# Patient Record
Sex: Male | Born: 1960 | Race: White | Hispanic: No | Marital: Married | Smoking: Former smoker
Health system: Southern US, Community
[De-identification: ages and names within clinical notes are randomized; demographics above are authoritative.]

## PROBLEM LIST (undated history)

## (undated) DIAGNOSIS — G629 Polyneuropathy, unspecified: Secondary | ICD-10-CM

## (undated) DIAGNOSIS — F319 Bipolar disorder, unspecified: Secondary | ICD-10-CM

## (undated) DIAGNOSIS — F329 Major depressive disorder, single episode, unspecified: Secondary | ICD-10-CM

## (undated) DIAGNOSIS — F32A Depression, unspecified: Secondary | ICD-10-CM

## (undated) DIAGNOSIS — F419 Anxiety disorder, unspecified: Secondary | ICD-10-CM

## (undated) DIAGNOSIS — G20 Parkinson's disease: Secondary | ICD-10-CM

## (undated) DIAGNOSIS — R251 Tremor, unspecified: Secondary | ICD-10-CM

## (undated) DIAGNOSIS — E7800 Pure hypercholesterolemia, unspecified: Secondary | ICD-10-CM

## (undated) DIAGNOSIS — I10 Essential (primary) hypertension: Secondary | ICD-10-CM

## (undated) DIAGNOSIS — E079 Disorder of thyroid, unspecified: Secondary | ICD-10-CM

## (undated) DIAGNOSIS — G20A1 Parkinson's disease without dyskinesia, without mention of fluctuations: Secondary | ICD-10-CM

## (undated) HISTORY — PX: HEMORRHOID SURGERY: SHX153

## (undated) HISTORY — DX: Tremor, unspecified: R25.1

## (undated) HISTORY — DX: Essential (primary) hypertension: I10

## (undated) HISTORY — PX: MULTIPLE TOOTH EXTRACTIONS: SHX2053

---
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
## 2013-06-29 ENCOUNTER — Encounter: Payer: Self-pay | Admitting: Neurology

## 2013-06-29 DIAGNOSIS — I10 Essential (primary) hypertension: Secondary | ICD-10-CM | POA: Insufficient documentation

## 2013-06-29 DIAGNOSIS — R251 Tremor, unspecified: Secondary | ICD-10-CM

## 2013-07-03 ENCOUNTER — Encounter: Payer: Self-pay | Admitting: Neurology

## 2013-07-03 ENCOUNTER — Ambulatory Visit (INDEPENDENT_AMBULATORY_CARE_PROVIDER_SITE_OTHER): Payer: BC Managed Care – PPO | Admitting: Neurology

## 2013-07-03 VITALS — BP 136/86 | HR 85 | Ht 72.0 in | Wt 171.0 lb

## 2013-07-03 DIAGNOSIS — R259 Unspecified abnormal involuntary movements: Secondary | ICD-10-CM

## 2013-07-03 DIAGNOSIS — I10 Essential (primary) hypertension: Secondary | ICD-10-CM

## 2013-07-03 DIAGNOSIS — R251 Tremor, unspecified: Secondary | ICD-10-CM

## 2013-07-10 ENCOUNTER — Ambulatory Visit: Payer: BC Managed Care – PPO

## 2013-07-10 NOTE — Progress Notes (Signed)
[facility]  PATIENT: Fidencio Duddy DOB: [birth year]  HISTORICAL  Mr. Bacha is a 52 years old right handed male, referred by his primary care physician Dr. Dewain Penning for evaluation of tremor. He is accompanied by his wife at today's clinical visit.  He has PMHx of HTN, hypothyroidism, on supplement, bipolar disorder, taking risperidone, more than ten years, lithium 300mg  tid, report level was within normal limit, he is also taking alprazolam 1mg  tid.  He is under the care of Dr. Tomasa Rand,   He had a lot of stress recently, his son died after struck by a truck in August 2013, his wife suffered stroke in August 2011.  He complains of bilateral hand tremor over the past few years, getting worse since 2013, fairly symmtric, if he holds anything more than 5 Lbs, for more than 10 minutes, he has right arm and hand shaking, he spills water when holding a glass of water.  He denies significant gait difficulty, denies loss sense of smell. He denies family history of tremor.  Lab in July 25th 2014, showed normal CBC.   REVIEW OF SYSTEMS: Full 14 system review of systems performed and notable only for chills, weight gain, fatigue, hearing loss, ringing in ears, trouble swallowing, blurred vision, shortness of breath, cough, wheezing, impotence, feeling cold, allergies, memory loss, confusion, numbness, weakness, difficulty swallowing, tremor, sleepiness, depression, anxiety, decreased energy, change in appetite, disinterested in activities, racing thoughts  ALLERGIES: No Known Allergies  HOME MEDICATIONS: Outpatient Prescriptions Prior to Visit  Medication Sig Dispense Refill  . ALPRAZolam (XANAX XR) 0.5 MG 24 hr tablet Take 1 mg by mouth 3 (three) times daily.       Marland Kitchen amLODipine (NORVASC) 5 MG tablet Take 5 mg by mouth daily.      Marland Kitchen levothyroxine (SYNTHROID, LEVOTHROID) 50 MCG tablet Take 75 mcg by mouth daily before breakfast.       . lithium carbonate 300 MG capsule  Take 300 mg by mouth 3 (three) times daily with meals.      . risperiDONE (RISPERDAL) 1 MG tablet Take 2 mg by mouth daily.         PAST MEDICAL HISTORY: Past Medical History  Diagnosis Date  . High blood pressure   . Tremor     PAST SURGICAL HISTORY: Past Surgical History  Procedure Laterality Date  . Hemorrhoid surgery      1980's    FAMILY HISTORY: Family History  Problem Relation Age of Onset  . Heart attack Mother   . Liver disease Father     SOCIAL HISTORY:  History   Social History  . Marital Status: Married    Spouse Name: Misty Stanley    Number of Children: 1  . Years of Education: 12   Occupational History  .      Not working   Social History Main Topics  . Smoking status: Current Every Day Smoker    Types: Cigarettes  . Smokeless tobacco: Never Used     Comment: One Pack daily  . Alcohol Use: 1.2 oz/week    2 Cans of beer per week     Comment: Daily  . Drug Use: No     Comment: Quit 2009  . Sexual Activity: Not on file    Social History Narrative   Patient lives at home with with his wife (stacy). Patient is not working at this time. Patient has a 12th grade education.    Caffeine - four mountain dews daily.  Right handed.    PHYSICAL EXAM  Filed Vitals:   07/03/13 0828  BP: 136/86  Pulse: 85  Height: 6' (1.829 m)  Weight: 171 lb (77.565 kg)    Body mass index is 23.19 kg/(m^2).   Generalized: In no acute distress  Neck: Supple, no carotid bruits   Cardiac: Regular rate rhythm  Pulmonary: Clear to auscultation bilaterally  Musculoskeletal: No deformity  Neurological examination  Mentation: depressed looking middle age male,  oriented to time, place, history taking, and causual conversation  Cranial nerve II-XII: Pupils were equal round reactive to light extraocular movements were full, visual field were full on confrontational test. facial sensation and strength were normal. hearing was intact to finger rubbing bilaterally.  Uvula tongue midline.  head turning and shoulder shrug and were normal and symmetric.Tongue protrusion into cheek strength was normal.  Motor: normal bulk and strength, mild bilateral hand positional tremor  Sensory: Intact to fine touch, pinprick, preserved vibratory sensation, and proprioception at toes.  Coordination: Normal finger to nose, heel-to-shin bilaterally there was no truncal ataxia  Gait: Rising up from seated position without assistance, normal stance, without trunk ataxia, moderate stride, good arm swing, smooth turning, able to perform tiptoe, and heel walking without difficulty.   Romberg signs: Negative  Deep tendon reflexes: Brachioradialis 2/2, biceps 2/2, triceps 2/2, patellar 2/2, Achilles 2/2, plantar responses were flexor bilaterally.   DIAGNOSTIC DATA (LABS, IMAGING, TESTING) - I reviewed patient records, labs, notes, testing and imaging myself where available.    ASSESSMENT AND PLAN   52 year old RH Caucasian male with history of bipolar disorder, on multiple medications, is on long term lithium use 300mg  tid, level was within therapeutics.  1. His tremor are most like due to long term lithium use 2.  Will precede with MRI brain ,to rule out cns structure lesions, with his constellation of complains including memory loss, difficulty swallowing    Levert Feinstein, M.D. Ph.D.  [facility] [address] [place], [place] [address] [phone]

## 2013-07-19 ENCOUNTER — Ambulatory Visit (INDEPENDENT_AMBULATORY_CARE_PROVIDER_SITE_OTHER): Payer: BC Managed Care – PPO

## 2013-07-19 DIAGNOSIS — I10 Essential (primary) hypertension: Secondary | ICD-10-CM

## 2013-07-19 DIAGNOSIS — R251 Tremor, unspecified: Secondary | ICD-10-CM

## 2013-07-19 DIAGNOSIS — R259 Unspecified abnormal involuntary movements: Secondary | ICD-10-CM

## 2013-07-21 NOTE — Progress Notes (Signed)
Quick Note:  MRI brain showed age related changes, no acute lesions. ______

## 2013-07-26 ENCOUNTER — Telehealth: Payer: Self-pay | Admitting: Neurology

## 2013-07-26 NOTE — Telephone Encounter (Signed)
Spoke to spouse. Gave MRI results.

## 2013-07-27 NOTE — Progress Notes (Signed)
Quick Note:  Spoke to wife and relayed MRI brain results, per Dr. Terrace Arabia. ______

## 2013-11-29 ENCOUNTER — Ambulatory Visit (INDEPENDENT_AMBULATORY_CARE_PROVIDER_SITE_OTHER): Payer: BC Managed Care – PPO | Admitting: Nurse Practitioner

## 2013-11-29 ENCOUNTER — Encounter: Payer: Self-pay | Admitting: Nurse Practitioner

## 2013-11-29 ENCOUNTER — Encounter (INDEPENDENT_AMBULATORY_CARE_PROVIDER_SITE_OTHER): Payer: Self-pay

## 2013-11-29 VITALS — BP 122/78 | HR 77 | Ht 72.0 in | Wt 159.0 lb

## 2013-11-29 DIAGNOSIS — R251 Tremor, unspecified: Secondary | ICD-10-CM

## 2013-11-29 DIAGNOSIS — R259 Unspecified abnormal involuntary movements: Secondary | ICD-10-CM

## 2013-11-29 NOTE — Progress Notes (Signed)
[facility]  PATIENT: [name] DOB: Oct 08, 1961   REASON FOR VISIT: follow up for tremor   HISTORY OF PRESENT ILLNESS: [name], 53 year old male returns for followup. He was initially evaluated by Dr. Krista Chavez 07/03/2013 for tremor. He has been on lithium for years has bipolar disorder. MRI of the brain done 07/19/2013 without acute findings. His tremor gets worse if he is anxious. He has problems holding anything more than 5 pounds which will cause his right arm and hand to shake. He has not fallen he has no balance issues. He denies loss of smell. There is no family history of tremor. He returns for reevaluation.   HISTORY: evaluation of tremor. He is accompanied by his wife at today's clinical visit.  He has PMHx of HTN, hypothyroidism, on supplement, bipolar disorder, taking risperidone, more than ten years, lithium 300mg  tid, report level was within normal limit, he is also taking alprazolam 1mg  tid. He is under the care of Dr. Candis Chavez, He had a lot of stress recently, his son died after struck by a truck in August 2013, his wife suffered stroke in August 2011.  He complains of bilateral hand tremor over the past few years, getting worse since 2013, fairly symmtric, if he holds anything more than 5 Lbs, for more than 10 minutes, he has right arm and hand shaking, he spills water when holding a glass of water.  He denies significant gait difficulty, denies loss sense of smell. He denies family history of tremor.  Lab in July 25th 2014, showed normal CBC.    REVIEW OF SYSTEMS: Full 14 system review of systems performed and notable only for those listed, all others are neg:  Constitutional: N/A  Cardiovascular: N/A  Ear/Nose/Throat: Drooling Skin: N/A  Eyes: N/A  Respiratory: N/A  Gastroitestinal: N/A  Hematology/Lymphatic: N/A  Endocrine: N/A Musculoskeletal:N/A  Allergy/Immunology: N/A  Neurological: Memory loss, tremors Psychiatric: Anxiety,   depression   ALLERGIES: No Known Allergies  HOME MEDICATIONS: Outpatient Prescriptions Prior to Visit  Medication Sig Dispense Refill  . amLODipine (NORVASC) 5 MG tablet Take 5 mg by mouth daily.      Marland Kitchen lithium carbonate 300 MG capsule Take 300 mg by mouth 3 (three) times daily with meals.      . ALPRAZolam (XANAX XR) 0.5 MG 24 hr tablet Take 1 mg by mouth 3 (three) times daily.       Marland Kitchen levothyroxine (SYNTHROID, LEVOTHROID) 50 MCG tablet Take 75 mcg by mouth daily before breakfast.       . risperiDONE (RISPERDAL) 1 MG tablet Take 2 mg by mouth daily.        No facility-administered medications prior to visit.    PAST MEDICAL HISTORY: Past Medical History  Diagnosis Date  . High blood pressure   . Tremor     PAST SURGICAL HISTORY: Past Surgical History  Procedure Laterality Date  . Hemorrhoid surgery      1980's    FAMILY HISTORY: Family History  Problem Relation Age of Onset  . Heart attack Mother   . Liver disease Father     SOCIAL HISTORY: History   Social History  . Marital Status: Married    Spouse Name: [name]    Number of Children: 1  . Years of Education: 12   Occupational History  .      Not working   Social History Main Topics  . Smoking status: Current Every Day Smoker    Types: Cigarettes  . Smokeless tobacco:  Never Used     Comment: One Pack daily  . Alcohol Use: No  . Drug Use: No     Comment: Quit 2009  . Sexual Activity: Not on file   Other Topics Concern  . Not on file   Social History Narrative   Patient lives at home with with [name]). Patient is not working at this time. Patient has a 12th grade education.    Caffeine - four mountain dews daily.   Right handed.   Patient has one child.     PHYSICAL EXAM  Filed Vitals:   11/29/13 1322  BP: 122/78  Pulse: 77  Height: 6' (1.829 m)  Weight: 159 lb (72.122 kg)   Body mass index is 21.56 kg/(m^2).  Generalized: Well developed, in no acute distress  Head:  normocephalic and atraumatic,. Oropharynx benign  Neck: Supple, no carotid bruits  Cardiac: Regular rate rhythm, no murmur  Musculoskeletal: No deformity   Neurological examination   Mentation: Alert oriented to time, place, history taking. Follows all commands speech and language fluent  Cranial nerve II-XII: Fundoscopic exam reveals sharp disc margins.Pupils were equal round reactive to light extraocular movements were full, visual field were full on confrontational test. Facial sensation and strength were normal. hearing was intact to finger rubbing bilaterally. Uvula tongue midline. head turning and shoulder shrug were normal and symmetric.Tongue protrusion into cheek strength was normal. Motor: normal bulk and tone, full strength in the BUE, BLE,  No focal weakness, mild bilateral outstretch tremor Sensory: normal and symmetric to light touch, pinprick, and  vibration  Coordination: finger-nose-finger, heel-to-shin bilaterally, no dysmetria Reflexes: Brachioradialis 2/2, biceps 2/2, triceps 2/2, patellar 2/2, Achilles 2/2, plantar responses were flexor bilaterally. Gait and Station: Rising up from seated position without assistance, normal stance,  moderate stride, good arm swing, smooth turning, able to perform tiptoe, and heel walking without difficulty. Tandem gait is steady. No assistive device  DIAGNOSTIC DATA (LABS, IMAGING, TESTING) -None to review  ASSESSMENT AND PLAN  53 y.o. year old male  has a past medical history of bipolar disorder and long-term lithium use and Tremor. here to followup. MRI of the brain performed in September with nothing acute.  We will continue to monitor tremor over time Most likely due to long term use of Lithium F/U yearly Dennie Bible, [facility], [facility], [facility] [address] [place], [place] [address] [phone]

## 2013-11-29 NOTE — Patient Instructions (Signed)
We will continue to monitor tremor over time Most likely due to long term use of Lithium MRI of the brain with nothing acute F/U yearly

## 2014-01-02 ENCOUNTER — Ambulatory Visit: Payer: BC Managed Care – PPO | Admitting: Nurse Practitioner

## 2014-03-07 DIAGNOSIS — Z0289 Encounter for other administrative examinations: Secondary | ICD-10-CM

## 2014-07-24 ENCOUNTER — Telehealth: Payer: Self-pay | Admitting: Neurology

## 2014-07-24 NOTE — Telephone Encounter (Signed)
Patient's wife calling to state that patient was seen today at [facility] where his PCP suggested to have him evaluated back at [facility] as soon as possible for  follow up for worsening symptoms.  He has an appointment scheduled in January but needs to be seen sooner. Best call back is [phone].

## 2014-07-24 NOTE — Telephone Encounter (Signed)
Called patient wife and told her Dr.Yan had a cx for 07-25-2014.

## 2014-07-25 ENCOUNTER — Encounter: Payer: Self-pay | Admitting: Neurology

## 2014-07-25 ENCOUNTER — Ambulatory Visit (INDEPENDENT_AMBULATORY_CARE_PROVIDER_SITE_OTHER): Payer: BC Managed Care – PPO | Admitting: Neurology

## 2014-07-25 VITALS — BP 123/83 | HR 86 | Ht 72.0 in | Wt 140.0 lb

## 2014-07-25 DIAGNOSIS — R251 Tremor, unspecified: Secondary | ICD-10-CM

## 2014-07-25 DIAGNOSIS — R259 Unspecified abnormal involuntary movements: Secondary | ICD-10-CM

## 2014-07-25 DIAGNOSIS — R269 Unspecified abnormalities of gait and mobility: Secondary | ICD-10-CM

## 2014-07-25 DIAGNOSIS — I10 Essential (primary) hypertension: Secondary | ICD-10-CM

## 2014-07-25 MED ORDER — CARBIDOPA-LEVODOPA 25-100 MG PO TABS: 1.0000 | ORAL_TABLET | Freq: Three times a day (TID) | ORAL | Status: DC

## 2014-07-25 NOTE — Progress Notes (Signed)
[facility]  PATIENT: [name] DOB: 1961/01/18   REASON FOR VISIT: follow up for tremor (initial visit August 2014)   HISTORY OF PRESENT ILLNESS:  He is accompanied by his wife at today's clinical visit.   He has PMHx of HTN, hypothyroidism, on supplement, bipolar disorder, taking risperidone, more than ten years, lithium 300mg  tid, report level was within normal limit, he is also taking alprazolam 1mg  tid. He is under the care of Dr. Candis Schatz, He had a lot of stress recently, his son died after struck by a truck in August 2013, his wife suffered stroke in August 2011.   He complains of bilateral hand tremor over the past few years, getting worse since 2013, fairly symmtric, if he holds anything more than 5 Lbs, for more than 10 minutes, he has right arm and hand shaking, he spills water when holding a glass of water.   He denies significant gait difficulty, denies loss sense of smell. He denies family history of tremor.  Lab in July 25th 2014, showed normal CBC.    MRI of the brain done 07/19/2013 without acute findings. His tremor gets worse if he is anxious.    UPDATE Sep 9th 2015:  He has gradually declining functional status, he is no longer working, sit at home most of the time, is no longer driving, he was evaluated by a psychiatrist Dr. Candis Schatz in June 2015, Lithium was decreased from 900mg  daily to 600 every day, he is also taking Risperdal 4 mg every day  He has worsening memory trouble, constipation, restless he also reported worsening depression, point of suicidal thought sometimes   REVIEW OF SYSTEMS: Full 14 system review of systems performed and notable only for those listed, all others are neg:  Activity change, appetite change, chills, fatigue, cold intolerance, constipation, walking difficulty, memory loss, dizziness, numbness, weakness, tremor, behavior problem, confusion, depression, anxiety.  ALLERGIES: No Known Allergies  HOME  MEDICATIONS: Outpatient Prescriptions Prior to Visit  Medication Sig Dispense Refill  . ALPRAZolam (XANAX) 1 MG tablet Take 1 mg by mouth 3 (three) times daily.      Marland Kitchen amLODipine (NORVASC) 5 MG tablet Take 5 mg by mouth daily.      Marland Kitchen levothyroxine (SYNTHROID, LEVOTHROID) 75 MCG tablet Take 75 mcg by mouth daily before breakfast.      . lithium carbonate 300 MG capsule Take 300 mg by mouth 3 (three) times daily with meals.      . risperiDONE (RISPERDAL) 2 MG tablet Take 2 mg by mouth at bedtime.       No facility-administered medications prior to visit.    PAST MEDICAL HISTORY: Past Medical History  Diagnosis Date  . High blood pressure   . Tremor     PAST SURGICAL HISTORY: Past Surgical History  Procedure Laterality Date  . Hemorrhoid surgery      1980's    FAMILY HISTORY: Family History  Problem Relation Age of Onset  . Heart attack Mother   . Liver disease Father     SOCIAL HISTORY: History   Social History  . Marital Status: Married    Spouse Name: [name]    Number of Children: 1  . Years of Education: 12   Occupational History  .      Not working   Social History Main Topics  . Smoking status: Current Every Day Smoker    Types: Cigarettes  . Smokeless tobacco: Never Used     Comment: One Pack daily  .  Alcohol Use: No  . Drug Use: No     Comment: Quit 2009  . Sexual Activity: Not on file   Other Topics Concern  . Not on file   Social History Narrative   Patient lives at home with with his wife [name]). Patient is not working at this time. Patient has a 12th grade education.    Caffeine - four mountain dews daily.   Right handed.   Patient has one child.     PHYSICAL EXAM  Filed Vitals:   07/25/14 0816  BP: 123/83  Pulse: 86  Height: 6' (1.829 m)  Weight: 140 lb (63.504 kg)   Body mass index is 18.98 kg/(m^2).  Generalized: Well developed, in no acute distress  Head: normocephalic and atraumatic,. Oropharynx benign  Neck: Supple, no  carotid bruits  Cardiac: Regular rate rhythm, no murmur  Musculoskeletal: No deformity   Neurological examination   Mentation: Alert oriented to time, place, history taking. Follows all commands speech and language fluent, Decreased facial expression, Mini-Mental Status Examination is 25 out of 30 today, he was not able to do serial 7, or spell world backwards  Cranial nerve II-XII: Fundoscopic exam reveals sharp disc margins.Pupils were equal round reactive to light extraocular movements were full, visual field were full on confrontational test. Facial sensation and strength were normal. hearing was intact to finger rubbing bilaterally. Uvula tongue midline. head turning and shoulder shrug were normal and symmetric.Tongue protrusion into cheek strength was normal. Motor: mild bilateral outstretch tremor, Mild bilateral limb and nuchal rigidity, bradykinesia on rapid wrist opening and closure,  Sensory: normal and symmetric to light touch, pinprick, and  vibration  Coordination: finger-nose-finger, heel-to-shin bilaterally, no dysmetria Reflexes: Brachioradialis 2/2, biceps 2/2, triceps 2/2, patellar 2/2, Achilles 2/2, plantar responses were flexor bilaterally. Gait and Station: Rising up from seated position without assistance, leaning forward, decreased bilateral arm swing,  DIAGNOSTIC DATA (LABS, IMAGING, TESTING) -None to review  ASSESSMENT AND PLAN  53 y.o. year old male  has a past medical history of bipolar disorder and long-term lithium and Risperdal , previous MRI of the brain showed no significant abnormalities, he continued to have more disorder, worsening depression, also memory trouble.  His constellation of complaints, most likely due to his mood disorder, long-term psychiatric medication use, including dopamine antagonist. He does has mild parkinsonian features, I will try low dose sinemet 25/100 mg 3 times a day  Home physical therapy Also encourage patient and his wife to  work closely with his psychiatrist, to better control his depression.

## 2014-10-25 ENCOUNTER — Ambulatory Visit: Payer: BC Managed Care – PPO | Admitting: Neurology

## 2014-10-26 ENCOUNTER — Ambulatory Visit (INDEPENDENT_AMBULATORY_CARE_PROVIDER_SITE_OTHER): Payer: BC Managed Care – PPO | Admitting: Neurology

## 2014-10-26 ENCOUNTER — Encounter: Payer: Self-pay | Admitting: Neurology

## 2014-10-26 VITALS — BP 114/73 | HR 99 | Ht 70.0 in | Wt 144.0 lb

## 2014-10-26 DIAGNOSIS — G20 Parkinson's disease: Secondary | ICD-10-CM

## 2014-10-26 DIAGNOSIS — R269 Unspecified abnormalities of gait and mobility: Secondary | ICD-10-CM

## 2014-10-26 DIAGNOSIS — R251 Tremor, unspecified: Secondary | ICD-10-CM

## 2014-10-26 MED ORDER — CARBIDOPA-LEVODOPA 25-100 MG PO TABS: 1.0000 | ORAL_TABLET | Freq: Three times a day (TID) | ORAL | Status: DC

## 2014-10-26 NOTE — Progress Notes (Signed)
[facility]  PATIENT: [name] DOB: 07-Feb-1971   REASON FOR VISIT: follow up for tremor (initial visit August 2014)   HISTORY OF PRESENT ILLNESS:  He is accompanied by his wife at today's clinical visit.   He has PMHx of HTN, hypothyroidism, on supplement, bipolar disorder, taking risperidone 4 mg daily, more than ten years, lithium 300mg  tid, report level was within normal limit, he is also taking alprazolam 1mg  tid. His psychiatrist is Dr. Candis Schatz, He had a lot of stress recently, his son died after struck by a truck in August 2013, his wife suffered stroke in August 2011.   He complains of bilateral hand tremor over the past few years, getting worse since 2013, fairly symmtric, if he holds anything more than 5 Lbs, for more than 10 minutes, he has right arm and hand shaking, he spills water when holding a glass of water.   He denies significant gait difficulty, denies loss sense of smell. He denies family history of tremor.  Lab in July 25th 2014, showed normal CBC.    MRI of the brain done 07/19/2013 without acute findings. His tremor gets worse if he is anxious.    UPDATE Sep 9th 2015:  He has gradually declining functional status, he is no longer working, sit at home most of the time, is no longer driving, he was recently evaluated by his psychiatrist Dr. Candis Schatz in June 2015, Lithium was decreased from 900mg  daily to 600 every day, he is also taking Risperdal 4 mg every day  He has worsening memory trouble, constipation, restless he also reported worsening depression, to the point of suicidal thought sometimes   UPDATE Oct 26 2014: Sinemet 25/100 3 times a day since Sep 2015, has been very helpful, his tremor has much improved,  he continue has mild unsteady gait, recently complains of bilateral heel pain, thick skin at left medial heel.  He is still taking Risperdal 4 mg every night, Xanax 1 mg half to one tablet as needed, lithium 300 mg 2  tablets every night, Synthroid 75 g once every day  REVIEW OF SYSTEMS: Full 14 system review of systems performed and notable only for those listed, all others are neg:  Ringing ears, running nose, trouble swallowing, drooling, double vision, shortness of breath, choking, constipation, acting out of dreams, neck stiffness, speech difficulty   ALLERGIES: Allergies  Allergen Reactions  . Codeine Other (See Comments)    Agitation/mean     HOME MEDICATIONS: Outpatient Prescriptions Prior to Visit  Medication Sig Dispense Refill  . ALPRAZolam (XANAX) 1 MG tablet Take 1 mg by mouth 3 (three) times daily.    Marland Kitchen amLODipine (NORVASC) 5 MG tablet Take 5 mg by mouth daily.    Marland Kitchen atorvastatin (LIPITOR) 20 MG tablet Take 20 mg by mouth at bedtime.    . carbidopa-levodopa (SINEMET) 25-100 MG per tablet Take 1 tablet by mouth 3 (three) times daily. 90 tablet 6  . levothyroxine (SYNTHROID, LEVOTHROID) 75 MCG tablet Take 75 mcg by mouth daily before breakfast.    . lithium carbonate 300 MG capsule Take 300 mg by mouth 2 (two) times daily with a meal.     . risperiDONE (RISPERDAL) 2 MG tablet Take 2 mg by mouth 4 (four) times daily.      No facility-administered medications prior to visit.    PAST MEDICAL HISTORY: Past Medical History  Diagnosis Date  . High blood pressure   . Tremor     PAST SURGICAL HISTORY:  Past Surgical History  Procedure Laterality Date  . Hemorrhoid surgery      1980's    FAMILY HISTORY: Family History  Problem Relation Age of Onset  . Heart attack Mother   . Liver disease Father     SOCIAL HISTORY: History   Social History  . Marital Status: Married    Spouse Name: [name]    Number of Children: 1  . Years of Education: 12   Occupational History  .      Not working   Social History Main Topics  . Smoking status: Current Every Day Smoker -- 1.00 packs/day    Types: Cigarettes  . Smokeless tobacco: Never Used     Comment: One Pack daily  . Alcohol  Use: No  . Drug Use: No     Comment: Quit 2009  . Sexual Activity: Not on file   Other Topics Concern  . Not on file   Social History Narrative   Patient lives at home with with his wife [name]). Patient is not working at this time. Patient has a 12th grade education.    Caffeine - four mountain dews daily.   Right handed.   Patient has one child.     PHYSICAL EXAM  Filed Vitals:   10/26/14 0823  BP: 114/73  Pulse: 99  Height: 5\' 10"  (1.778 m)  Weight: 144 lb (65.318 kg)   Body mass index is 20.66 kg/(m^2).  Generalized: Well developed, in no acute distress  Head: normocephalic and atraumatic,. Oropharynx benign  Neck: Supple, no carotid bruits  Cardiac: Regular rate rhythm, no murmur  Musculoskeletal: No deformity   Neurological examination   Mentation: Alert oriented to time, place, history taking. Follows all commands speech and language fluent, Decreased facial expression, Mini-Mental Status Examination is 28 out of 30 today, he is not oriented to the name of the clinic, mild difficulty with serial 7 Cranial nerve II-XII: Fundoscopic exam reveals sharp disc margins.Pupils were equal round reactive to light extraocular movements were full, visual field were full on confrontational test. Facial sensation and strength were normal. hearing was intact to finger rubbing bilaterally. Uvula tongue midline. head turning and shoulder shrug were normal and symmetric.Tongue protrusion into cheek strength was normal. Motor: mild bilateral outstretch tremor, Mild bilateral limb and nuchal rigidity, bradykinesia on rapid wrist opening and closure,  Sensory: normal and symmetric to light touch, pinprick, and  vibration  Coordination: finger-nose-finger, heel-to-shin bilaterally, no dysmetria Reflexes: Brachioradialis 2/2, biceps 2/2, triceps 2/2, patellar 2/2, Achilles 2/2, plantar responses were flexor bilaterally. Gait and Station: Rising up from seated position without assistance,  leaning forward, decreased bilateral arm swing,  DIAGNOSTIC DATA (LABS, IMAGING, TESTING) -None to review  ASSESSMENT AND PLAN  53 y.o. year old male  has a past medical history of bipolar disorder and long-term lithium and Risperdal , previous MRI of the brain showed no significant abnormalities, he continued to have mood disorder, worsening depression, also memory trouble.  His constellation of complaints, most likely due to his mood disorder, long-term psychiatric medication use, including dopamine antagonist. He does has mild parkinsonian features, which has responded to sinemet 25/100 mg 3 times a day very well  Home physical therapy Refill his Sinemet 25/100mg  3 times a day Return to clinic in 6 months with Hoyle Sauer.  Marcial Pacas, M.D. Ph.D.  [facility] [place], [place] [address] Phone: [phone] Fax:      [phone]

## 2014-10-31 ENCOUNTER — Ambulatory Visit: Payer: BC Managed Care – PPO | Admitting: Podiatry

## 2014-11-12 ENCOUNTER — Ambulatory Visit (INDEPENDENT_AMBULATORY_CARE_PROVIDER_SITE_OTHER): Payer: BC Managed Care – PPO

## 2014-11-12 ENCOUNTER — Encounter: Payer: Self-pay | Admitting: Podiatry

## 2014-11-12 ENCOUNTER — Ambulatory Visit (INDEPENDENT_AMBULATORY_CARE_PROVIDER_SITE_OTHER): Payer: BC Managed Care – PPO | Admitting: Podiatry

## 2014-11-12 VITALS — BP 119/79 | HR 84 | Resp 16

## 2014-11-12 DIAGNOSIS — M722 Plantar fascial fibromatosis: Secondary | ICD-10-CM

## 2014-11-12 DIAGNOSIS — M79673 Pain in unspecified foot: Secondary | ICD-10-CM

## 2014-11-12 MED ORDER — TRIAMCINOLONE ACETONIDE 10 MG/ML IJ SUSP
10.0000 mg | Freq: Once | INTRAMUSCULAR | Status: AC
  Administered 2014-11-12: 10 mg

## 2014-11-12 NOTE — Progress Notes (Signed)
   Subjective:    Patient ID: [name], male    DOB: 1961/10/28, 53 y.o.   MRN: [id number]  HPI Comments: "I have pain in the heels"  Patient c/o sharp sensations plantar and medial heel bilateral, left over right, for about 2 months. Pain is worse at night. There is a large callused area medial heel left. PCP recommended referral. Tried soaking feet.  Foot Pain Associated symptoms include coughing, fatigue, numbness and weakness.      Review of Systems  Constitutional: Positive for appetite change and fatigue.  Respiratory: Positive for cough and wheezing.   Gastrointestinal: Positive for constipation.  Endocrine: Positive for cold intolerance.  Musculoskeletal: Positive for gait problem.  Skin:       Change in nails  Neurological: Positive for tremors, weakness and numbness.  Psychiatric/Behavioral: Positive for behavioral problems. The patient is nervous/anxious.   All other systems reviewed and are negative.      Objective:   Physical Exam        Assessment & Plan:

## 2014-11-12 NOTE — Patient Instructions (Signed)

## 2014-11-12 NOTE — Progress Notes (Signed)
Subjective:     Patient ID: [name], male   DOB: 03-14-1961, 53 y.o.   MRN: [id number]  HPI patient states I have pain in my heels left over right with burning pain at night and shooting discomforts. Patient has had long-term history of Parkinson's disease and states the pain is been present for several months   Review of Systems  All other systems reviewed and are negative.      Objective:   Physical Exam  Constitutional: He is oriented to person, place, and time.  Cardiovascular: Intact distal pulses.   Musculoskeletal: Normal range of motion.  Neurological: He is oriented to person, place, and time.  Skin: Skin is warm and dry.  Nursing note and vitals reviewed.  neurovascular status found to be intact with muscle strength adequate and range of motion subtalar midtarsal joint within normal limits. Patient does have some tremors in his feet and he does have some distal muscle loss noted with digits that are well-perfused and patient found to be oriented but does present with wife who acts as caregiver. He did have moderate discomfort in the plantar heel left over right with inflammation noted at the insertion and also may be experiencing some neurological discomfort     Assessment:     Possible plantar fasciitis versus neuropathy or other condition affecting left over right foot    Plan:     H&P and x-rays reviewed and today I did plantar injection 3 mg Kenalog 5 mg Xylocaine to reduce inflammation and we'll reevaluate again in 1 week and may need to be placed on gabapentin depending on response

## 2014-11-19 ENCOUNTER — Ambulatory Visit (INDEPENDENT_AMBULATORY_CARE_PROVIDER_SITE_OTHER): Payer: BLUE CROSS/BLUE SHIELD | Admitting: Podiatry

## 2014-11-19 VITALS — BP 128/86 | HR 87 | Resp 18

## 2014-11-19 DIAGNOSIS — M722 Plantar fascial fibromatosis: Secondary | ICD-10-CM | POA: Diagnosis not present

## 2014-11-19 DIAGNOSIS — G629 Polyneuropathy, unspecified: Secondary | ICD-10-CM | POA: Diagnosis not present

## 2014-11-19 MED ORDER — GABAPENTIN 400 MG PO CAPS
400.0000 mg | ORAL_CAPSULE | Freq: Two times a day (BID) | ORAL | Status: DC
Start: 2014-11-19 — End: 2016-01-28

## 2014-11-19 NOTE — Progress Notes (Signed)
Subjective:     Patient ID: [name], male   DOB: 03-Aug-1961, 54 y.o.   MRN: [id number]  HPI patient states that he still has a lot of soreness and burning tingling pain at night   Review of Systems     Objective:   Physical Exam Neurovascular status unchanged with muscle strength and range of motion within normal limits despite Parkinson's. Patient is noted to have minimal to moderate comfort plantar right but extensive amount of burning which occurs during the evening and while sleeping    Assessment:     H&P and condition discussed with him and his wife    Plan:     Reviewed condition and at this time we are start on gabapentin 400 mg 1 pill at night and then 2 pills if he tolerates it well over a few weeks. We discussed this at great length and hopefully we will get results with this treatment

## 2014-11-29 ENCOUNTER — Ambulatory Visit: Payer: BC Managed Care – PPO | Admitting: Nurse Practitioner

## 2014-12-31 ENCOUNTER — Ambulatory Visit: Payer: BLUE CROSS/BLUE SHIELD | Admitting: Podiatry

## 2015-01-02 ENCOUNTER — Ambulatory Visit (INDEPENDENT_AMBULATORY_CARE_PROVIDER_SITE_OTHER): Payer: BLUE CROSS/BLUE SHIELD | Admitting: Podiatry

## 2015-01-02 ENCOUNTER — Encounter: Payer: Self-pay | Admitting: Podiatry

## 2015-01-02 VITALS — BP 130/81 | HR 88 | Resp 16

## 2015-01-02 DIAGNOSIS — M722 Plantar fascial fibromatosis: Secondary | ICD-10-CM

## 2015-01-02 DIAGNOSIS — G629 Polyneuropathy, unspecified: Secondary | ICD-10-CM

## 2015-01-02 NOTE — Progress Notes (Signed)
Subjective:     Patient ID: [name], male   DOB: 1961-06-20, 54 y.o.   MRN: [id number]  HPI patient presents with his wife stating I'm still getting a lot of burning in my heel at nighttime and it does hurt when I stepped down on it. Points to the left heel   Review of Systems     Objective:   Physical Exam Neurovascular status unchanged with patient taking 800 mg of Neurontin at night which is failed to help with burning pain and noted to have discomfort upon palpation to the plantar center and medial heel. Does also have a large callus on the medial side and has a negative Tinel's sign at this time    Assessment:     Continued possibility for neuropathic problem with lesion formation and fasciitis as concerns    Plan:     I debrided the lesion today and applied night splint to try to stretch the foot during the evening. Patient will be seen back in 4 weeks to reevaluate and will stop the Neurontin at this time as it does not seem to be helping

## 2015-01-21 ENCOUNTER — Encounter: Payer: Self-pay | Admitting: Podiatry

## 2015-01-21 ENCOUNTER — Ambulatory Visit (INDEPENDENT_AMBULATORY_CARE_PROVIDER_SITE_OTHER): Payer: BLUE CROSS/BLUE SHIELD | Admitting: Podiatry

## 2015-01-21 VITALS — BP 118/76 | HR 88 | Resp 12

## 2015-01-21 DIAGNOSIS — M722 Plantar fascial fibromatosis: Secondary | ICD-10-CM | POA: Diagnosis not present

## 2015-01-21 DIAGNOSIS — G629 Polyneuropathy, unspecified: Secondary | ICD-10-CM

## 2015-01-21 NOTE — Progress Notes (Signed)
Subjective:     Patient ID: [name], male   DOB: [birth year], 54 y.o.   MRN: [id number]  HPI patient states that I'm improving with minimal discomfort noted with the brace and the trimming has helped   Review of Systems     Objective:   Physical Exam Neurovascular status remains intact with discomfort plantar heel left that's improved and keratotic lesion on the medial side that is secondary to probable dragging of his foot with long-term Parkinson's as long-term problem    Assessment:     Fasciitis neuropathy and gait abnormalities    Plan:     Reviewed condition and discussed at great length. At this point I have recommended continuation of conservative care with boot usage stretching occasional ice and debridement which was accomplished today. We discussed this at great length we will continue along the same course and review again in several months

## 2015-02-25 ENCOUNTER — Ambulatory Visit (INDEPENDENT_AMBULATORY_CARE_PROVIDER_SITE_OTHER): Payer: BLUE CROSS/BLUE SHIELD | Admitting: Podiatry

## 2015-02-25 ENCOUNTER — Encounter: Payer: Self-pay | Admitting: Podiatry

## 2015-02-25 VITALS — BP 135/89 | HR 81 | Resp 16

## 2015-02-25 DIAGNOSIS — L02619 Cutaneous abscess of unspecified foot: Secondary | ICD-10-CM | POA: Diagnosis not present

## 2015-02-25 DIAGNOSIS — L03119 Cellulitis of unspecified part of limb: Secondary | ICD-10-CM

## 2015-02-26 NOTE — Progress Notes (Signed)
Subjective:     Patient ID: [name], male   DOB: Jun 15, 1961, 54 y.o.   MRN: [id number]  HPI patient presents with breakdown on the right lateral side of the foot and slightly on the left heel. I do not know of any specific pressure points to the not sure why this occurs but they are just concerned there may be infection   Review of Systems     Objective:   Physical Exam Neurovascular status found to be intact with no other changes in the health history since I saw him last and has a lesion on the lateral side of the right foot with slight drainage noted that's localized in nature with no proximal edema erythema or drainage noted at this time. Patient's left heel is just dry skin with no indications of blistering    Assessment:     Localized abscess right lateral foot with no indications of proximal spread currently    Plan:     Anesthesia achieved around the area sterile prep done to the area and then using sharp sterile his mentation the areas opened up with localized drainage which was flushed and then pad applied with Silvadene. This should heal uneventfully and they will be very careful with shoe gear that does not press against this area

## 2015-03-18 ENCOUNTER — Ambulatory Visit: Payer: BLUE CROSS/BLUE SHIELD | Admitting: Podiatry

## 2015-05-02 ENCOUNTER — Ambulatory Visit (INDEPENDENT_AMBULATORY_CARE_PROVIDER_SITE_OTHER): Payer: BLUE CROSS/BLUE SHIELD | Admitting: Nurse Practitioner

## 2015-05-02 ENCOUNTER — Encounter: Payer: Self-pay | Admitting: Nurse Practitioner

## 2015-05-02 VITALS — BP 113/74 | HR 81 | Ht 72.0 in | Wt 136.5 lb

## 2015-05-02 DIAGNOSIS — R251 Tremor, unspecified: Secondary | ICD-10-CM

## 2015-05-02 DIAGNOSIS — G20 Parkinson's disease: Secondary | ICD-10-CM

## 2015-05-02 DIAGNOSIS — R269 Unspecified abnormalities of gait and mobility: Secondary | ICD-10-CM | POA: Diagnosis not present

## 2015-05-02 NOTE — Progress Notes (Signed)
[facility]  PATIENT: [name] DOB: October 25, 1961   REASON FOR VISIT: Follow-up for tremor and parkinsonism HISTORY FROM: Patient    HISTORY OF PRESENT ILLNESS:He has PMHx of HTN, hypothyroidism, on supplement, bipolar disorder, taking risperidone 4 mg daily, more than ten years, lithium 300mg  tid, report level was within normal limit, he is also taking alprazolam 1mg  tid. His psychiatrist is Dr. Candis Schatz, He had a lot of stress recently, his son died after struck by a truck in August 2013, his wife suffered stroke in August 2011.  He complains of bilateral hand tremor over the past few years, getting worse since 2013, fairly symmtric, if he holds anything more than 5 Lbs, for more than 10 minutes, he has right arm and hand shaking, he spills water when holding a glass of water. He denies significant gait difficulty, denies loss sense of smell. He denies family history of tremor.  Lab in July 25th 2014, showed normal CBC.  MRI of the brain done 07/19/2013 without acute findings. His tremor gets worse if he is anxious.  UPDATE Sep 9th 2015:He has gradually declining functional status, he is no longer working, sit at home most of the time, is no longer driving, he was recently evaluated by his psychiatrist Dr. Candis Schatz in June 2015, Lithium was decreased from 900mg  daily to 600 every day, he is also taking Risperdal 4 mg every day He has worsening memory trouble, constipation, restless he also reported worsening depression, to the point of suicidal thought sometimes  UPDATE Oct 26 2014: Sinemet 25/100 3 times a day since Sep 2015, has been very helpful, his tremor has much improved, he continue has mild unsteady gait, recently complains of bilateral heel pain, thick skin at left medial heel.He is still taking Risperdal 4 mg every night, Xanax 1 mg half to one tablet as needed, lithium 300 mg 2 tablets every night, Synthroid 75 g once every day UPDATE 05/02/2015 [name], 54 year old male returns for follow-up he has a long history of mood disorder bipolar disease and psychiatric medication use. He has a very mild tremor that is worse with anxiety. He recently had all of his bottom teeth pulled. Has had gradual decline in functional status over the last couple years. He no longer drives. He is currently on Sinemet 25 100,  3 times daily with good benefit. He still has occasional difficulty when eating and was encouraged to use weighted utensils. He returns for reevaluation. He has chronic foot ulcers and sees a podiatrist  REVIEW OF SYSTEMS: Full 14 system review of systems performed and notable only for those listed, all others are neg:  Constitutional: neg  Cardiovascular: neg Ear/Nose/Throat: Drooling Skin: neg Eyes: neg Respiratory: neg Gastroitestinal: Constipation Hematology/Lymphatic: neg  Endocrine: Intolerance to cold Musculoskeletal: Walking difficulty Allergy/Immunology: neg Neurological: Numbness in the hands at times, speech difficulty tremors Psychiatric: Depression and anxiety, bipolar disorder Sleep : neg   ALLERGIES: Allergies  Allergen Reactions  . Codeine Other (See Comments)    Agitation/mean     HOME MEDICATIONS: Outpatient Prescriptions Prior to Visit  Medication Sig Dispense Refill  . ALPRAZolam (XANAX) 1 MG tablet Take 1 mg by mouth 3 (three) times daily.    Marland Kitchen amLODipine (NORVASC) 5 MG tablet Take 5 mg by mouth daily.    Marland Kitchen atorvastatin (LIPITOR) 20 MG tablet Take 20 mg by mouth at bedtime.    . carbidopa-levodopa (SINEMET) 25-100 MG per tablet Take 1 tablet by mouth 3 (three) times daily. [place]  tablet 3  . levothyroxine (SYNTHROID, LEVOTHROID) 75 MCG tablet Take 75 mcg by mouth daily before breakfast.    . lithium carbonate 300 MG capsule Take 300 mg by mouth 2 (two) times daily with a meal.     . risperidone (RISPERDAL) 4 MG tablet 4 mg at bedtime.    . gabapentin (NEURONTIN) 400 MG capsule Take 1 capsule (400 mg  total) by mouth 2 (two) times daily. (Patient not taking: Reported on 05/02/2015) 60 capsule 2   No facility-administered medications prior to visit.    PAST MEDICAL HISTORY: Past Medical History  Diagnosis Date  . High blood pressure   . Tremor     PAST SURGICAL HISTORY: Past Surgical History  Procedure Laterality Date  . Hemorrhoid surgery      1980's  . Multiple tooth extractions      2016 Has top plate    FAMILY HISTORY: Family History  Problem Relation Age of Onset  . Heart attack Mother   . Liver disease Father     SOCIAL HISTORY: History   Social History  . Marital Status: Married    Spouse Name: [name]  . Number of Children: 1  . Years of Education: 12   Occupational History  .      Not working   Social History Main Topics  . Smoking status: Current Every Day Smoker -- 1.00 packs/day    Types: Cigarettes  . Smokeless tobacco: Never Used     Comment: One Pack daily  . Alcohol Use: No  . Drug Use: No     Comment: Quit 2009  . Sexual Activity: Not on file   Other Topics Concern  . Not on file   Social History Narrative   Patient lives at home with with [name]). Patient is not working at this time. Patient has a 12th grade education.    Caffeine - four mountain dews daily.   Right handed.   Patient has one child.     PHYSICAL EXAM  Filed Vitals:   05/02/15 0824  BP: 113/74  Pulse: 81  Height: 6' (1.829 m)  Weight: 136 lb 8 oz (61.916 kg)   Body mass index is 18.51 kg/(m^2). Generalized: Well developed, in no acute distress  Head: normocephalic and atraumatic,. Oropharynx benign  Neck: Supple, no carotid bruits   Musculoskeletal: No deformity   Neurological examination   Mentation: Alert oriented to time, place, history taking. Follows all commands speech and language fluent, Decreased facial expression,  Cranial nerve II-XII: Fundoscopic exam reveals sharp disc margins.Pupils were equal round reactive to light extraocular  movements were full, visual field were full on confrontational test. Facial sensation and strength were normal. hearing was intact to finger rubbing bilaterally. Uvula tongue midline. head turning and shoulder shrug were normal and symmetric.Tongue protrusion into cheek strength was normal. Motor: mild bilateral outstretch tremor, Mild bilateral limb and nuchal rigidity, mild bradykinesia on rapid wrist opening and closure,  Sensory intact to soft touch and pinprick and vibratory Coordination: finger-nose-finger, heel-to-shin bilaterally, no dysmetria Reflexes: Brachioradialis 2/2, biceps 2/2, triceps 2/2, patellar 2/2, Achilles 2/2, plantar responses were flexor bilaterally. Gait and Station: Rising up from seated position without assistance, leaning forward, decreased bilateral arm swing, no difficulty with turns, can heel and toe walk and tandem without difficulty. No assistive device   DIAGNOSTIC DATA (LABS, IMAGING, TESTING) -ASSESSMENT AND PLAN  54 y.o. year old male  has a past medical history of High blood pressure and Tremor. here  to follow-up.  Continue Carbodopa levodopa at current dose, does not need refills May use weighted utensils for eating purposes If sores on feet heel call for physical therapy 2 begin In the mean time can do chair exercises F/u 6 to 8 months Dennie Bible, [facility], [facility], APRN  [facility] [address], [place], [place] [address] [phone]

## 2015-05-02 NOTE — Patient Instructions (Signed)
Continue Carbodopa levodopa at current dose If sores on feet heel call for physical therapy In the mean time can do chair exercises F/u 6 to 8 months

## 2015-05-02 NOTE — Progress Notes (Signed)
I have reviewed and agreed above plan. 

## 2015-06-17 ENCOUNTER — Ambulatory Visit (INDEPENDENT_AMBULATORY_CARE_PROVIDER_SITE_OTHER): Payer: BLUE CROSS/BLUE SHIELD | Admitting: Podiatry

## 2015-06-17 ENCOUNTER — Encounter: Payer: Self-pay | Admitting: Podiatry

## 2015-06-17 ENCOUNTER — Ambulatory Visit (INDEPENDENT_AMBULATORY_CARE_PROVIDER_SITE_OTHER): Payer: BLUE CROSS/BLUE SHIELD

## 2015-06-17 VITALS — BP 136/94 | HR 86 | Resp 15

## 2015-06-17 DIAGNOSIS — L84 Corns and callosities: Secondary | ICD-10-CM | POA: Diagnosis not present

## 2015-06-17 DIAGNOSIS — L02619 Cutaneous abscess of unspecified foot: Secondary | ICD-10-CM

## 2015-06-17 DIAGNOSIS — L03119 Cellulitis of unspecified part of limb: Secondary | ICD-10-CM

## 2015-06-19 NOTE — Progress Notes (Signed)
Subjective:     Patient ID: [name], male   DOB: 28-May-1961, 54 y.o.   MRN: [id number]  HPI patient presents with caregiver with an inflamed area around the fifth metatarsal base right that continues to irritate him and at times she states they can drain but there's been no proximal redness or swelling or pain it is strictly on this spot   Review of Systems     Objective:   Physical Exam Neurovascular status found to be intact with muscle strength diminished range of motion diminished secondary to Parkinson's disease with an inflamed area around the fifth metatarsal base right with keratotic lesion formation that's present but no current indications of fluid buildup or other pathological process    Assessment:     Lesion secondary to friction and pressure right fifth metatarsal base that is also related to some prominence of the bone structure    Plan:     Today I x-rayed the area and reviewed and I debrided the area with no indications of current drainage. I did discuss the utilization of surgical shoe which was given to the patient and soaks and if it does not improve we'll need to consider a surgical intervention and procedure on this. Patient will be seen back in 1 month or earlier if any issues should occur

## 2015-07-15 ENCOUNTER — Ambulatory Visit (INDEPENDENT_AMBULATORY_CARE_PROVIDER_SITE_OTHER): Payer: BLUE CROSS/BLUE SHIELD | Admitting: Podiatry

## 2015-07-15 ENCOUNTER — Encounter: Payer: Self-pay | Admitting: Podiatry

## 2015-07-15 ENCOUNTER — Ambulatory Visit (INDEPENDENT_AMBULATORY_CARE_PROVIDER_SITE_OTHER): Payer: BLUE CROSS/BLUE SHIELD

## 2015-07-15 VITALS — BP 124/79 | HR 69 | Resp 16

## 2015-07-15 DIAGNOSIS — L03119 Cellulitis of unspecified part of limb: Secondary | ICD-10-CM | POA: Diagnosis not present

## 2015-07-15 DIAGNOSIS — L02619 Cutaneous abscess of unspecified foot: Secondary | ICD-10-CM

## 2015-07-15 DIAGNOSIS — L89891 Pressure ulcer of other site, stage 1: Secondary | ICD-10-CM | POA: Diagnosis not present

## 2015-07-15 DIAGNOSIS — D169 Benign neoplasm of bone and articular cartilage, unspecified: Secondary | ICD-10-CM | POA: Diagnosis not present

## 2015-07-15 NOTE — Progress Notes (Signed)
Subjective:     Patient ID: [name], male   DOB: Jan 23, 1961, 54 y.o.   MRN: [id number]  HPI patient states it seems a little better wearing my surgical shoe   Review of Systems     Objective:   Physical Exam Neurovascular status intact with redness around the base of the fifth metatarsal right with irritation of the tissue that's localized in nature with no indications of drainage currently or proximal edema erythema    Assessment:     Crusted ulceration on the base of the fifth metatarsal right with hypertrophy of bone as part of the pathology    Plan:     Debrided the tissue with a small amount of localized ulcerated tissue measuring 2 x 2 mm and applied dressing with Neosporin. Advised on padding and the fact we still may need to trim the bone and I reviewed x-rays indicating no signs of osteomyelitic changes. Reappoint one month to see results and to gradually get back into shoes earlier if any issues should occur

## 2015-08-14 ENCOUNTER — Ambulatory Visit (INDEPENDENT_AMBULATORY_CARE_PROVIDER_SITE_OTHER): Payer: BLUE CROSS/BLUE SHIELD | Admitting: Podiatry

## 2015-08-14 ENCOUNTER — Encounter: Payer: Self-pay | Admitting: Podiatry

## 2015-08-14 VITALS — BP 115/73 | HR 83 | Resp 16

## 2015-08-14 DIAGNOSIS — L03119 Cellulitis of unspecified part of limb: Secondary | ICD-10-CM | POA: Diagnosis not present

## 2015-08-14 DIAGNOSIS — L02619 Cutaneous abscess of unspecified foot: Secondary | ICD-10-CM

## 2015-08-14 DIAGNOSIS — L84 Corns and callosities: Secondary | ICD-10-CM

## 2015-08-14 DIAGNOSIS — D169 Benign neoplasm of bone and articular cartilage, unspecified: Secondary | ICD-10-CM

## 2015-08-14 NOTE — Progress Notes (Signed)
Subjective:     Patient ID: [name], male   DOB: 04-14-1961, 54 y.o.   MRN: [id number]  HPI patient states it seems to be doing a little better and it does not hurt like it did   Review of Systems     Objective:   Physical Exam Neurovascular status in health history unchanged with patient having a keratotic lesion base of fifth metatarsal right with mild redness but no proximal edema erythema or drainage noted    Assessment:     Lesion secondary to bony prominence on the base of fifth metatarsal right    Plan:     H&P and condition reviewed with patient and caregiver. At one point we still may consider bony excision but since it's not sore today I debrided advised on continued soaks and applied padding. Reappoint 6 weeks to reevaluate and I did discuss the surgical procedure that we would do if this remains a problem or if it were to break down again

## 2015-09-25 ENCOUNTER — Ambulatory Visit (INDEPENDENT_AMBULATORY_CARE_PROVIDER_SITE_OTHER): Payer: BLUE CROSS/BLUE SHIELD | Admitting: Podiatry

## 2015-09-25 ENCOUNTER — Encounter: Payer: Self-pay | Admitting: Podiatry

## 2015-09-25 VITALS — BP 139/94 | HR 91 | Resp 16

## 2015-09-25 DIAGNOSIS — L84 Corns and callosities: Secondary | ICD-10-CM

## 2015-09-25 DIAGNOSIS — D169 Benign neoplasm of bone and articular cartilage, unspecified: Secondary | ICD-10-CM

## 2015-09-25 NOTE — Progress Notes (Signed)
Subjective:     Patient ID: [name], male   DOB: 1961-10-12, 54 y.o.   MRN: [id number]  HPI patient presents with wife stating he still getting a lesion on the outside of his right foot and this may ultimately require   Review of Systems     Objective:   Physical Exam Neurovascular status intact muscle strength adequate range of motion within normal limits with patient noted to have keratotic lesion on the outside of the fifth metatarsal base right with prominence the bone noted and irritation of tissue. It is localized with no drainage    Assessment:     Exostotic lesion base of fifth metatarsal right with pain    Plan:     Reviewed condition and discussed at one point shaving the bone to reduce pressure against the side. At this point we will continue debridement at one point in the future this should be necessary

## 2015-10-07 ENCOUNTER — Ambulatory Visit: Payer: BLUE CROSS/BLUE SHIELD | Admitting: Neurology

## 2015-10-14 ENCOUNTER — Other Ambulatory Visit: Payer: Self-pay | Admitting: Neurology

## 2015-10-15 ENCOUNTER — Ambulatory Visit (INDEPENDENT_AMBULATORY_CARE_PROVIDER_SITE_OTHER): Payer: BLUE CROSS/BLUE SHIELD | Admitting: Neurology

## 2015-10-15 ENCOUNTER — Encounter: Payer: Self-pay | Admitting: Neurology

## 2015-10-15 VITALS — BP 117/78 | HR 72 | Ht 72.0 in | Wt 139.0 lb

## 2015-10-15 DIAGNOSIS — R269 Unspecified abnormalities of gait and mobility: Secondary | ICD-10-CM | POA: Diagnosis not present

## 2015-10-15 DIAGNOSIS — R251 Tremor, unspecified: Secondary | ICD-10-CM

## 2015-10-15 DIAGNOSIS — G2119 Other drug induced secondary parkinsonism: Secondary | ICD-10-CM | POA: Diagnosis not present

## 2015-10-15 DIAGNOSIS — R531 Weakness: Secondary | ICD-10-CM | POA: Diagnosis not present

## 2015-10-15 MED ORDER — CARBIDOPA-LEVODOPA 25-100 MG PO TABS: 1.0000 | ORAL_TABLET | Freq: Three times a day (TID) | ORAL | Status: DC

## 2015-10-15 NOTE — Progress Notes (Signed)
Chief Complaint  Patient presents with  . Tremors    He is here with his wife, [name].  Feels his tremors are well controlled on his current dose of Sinemet.  . Gait Problem    He is still having problems with both his feet.  He has a pending surgery on his right foot with Dr. Paulla Dolly.     [facility]  PATIENT: Casey Sadlowski DOB: Jan 24, 1961   REASON FOR VISIT: Follow-up for tremor and parkinsonism HISTORY FROM: Patient  HISTORY OF PRESENT ILLNESS:He has PMHx of HTN, hypothyroidism, on supplement, bipolar disorder, taking risperidone 4 mg daily for more than ten years, lithium 300mg  tid, report level was within normal limit, he is also taking alprazolam 1mg  tid. His psychiatrist is Dr. Candis Schatz, He had a lot of stress recently, his son died after struck by a truck in August 2013, his wife suffered stroke in August 2011.  He complains of bilateral hand tremor over the past few years, getting worse since 2013, fairly symmtric, if he holds anything more than 5 Lbs, for more than 10 minutes, he has right arm and hand shaking, he spills water when holding a glass of water. He denies significant gait difficulty, denies loss sense of smell. He denies family history of tremor.  Lab in July 25th 2014, showed normal CBC.   MRI of the brain done 07/19/2013 without acute findings. His tremor gets worse if he is anxious.  UPDATE Sep 9th 2015:He has gradually declining functional status, he is no longer working, sit at home most of the time, is no longer driving, he was recently evaluated by his psychiatrist Dr. Candis Schatz in June 2015, Lithium was decreased from 900mg  daily to 600 every day, he is also taking Risperdal 4 mg every day He has worsening memory trouble, constipation, restless he also reported worsening depression, to the point of suicidal thought sometimes   UPDATE Oct 26 2014: Sinemet 25/100 3 times a day since Sep 2015, has been very helpful, his tremor has much  improved, he continue has mild unsteady gait, recently complains of bilateral heel pain, thick skin at left medial heel.He is still taking Risperdal 4 mg every night, Xanax 1 mg half to one tablet as needed, lithium 300 mg 2 tablets every night, Synthroid 75 g once every day  UPDATE Oct 15 2015: His tremor is better with sinemet,  He complains of right foot pain, planning on to have surgery soon. Today his wife is also concerned about his slow worsening gait difficulty, unsteady gait, difficulty to clear his feet from the floor, worsening bilateral upper extremity weakness, bilateral deltoid muscle wasting    REVIEW OF SYSTEMS: Full 14 system review of systems performed and notable only for those listed, all others are neg: Fatigue, drooling, choking, constipation, walking difficulty, wounds, memory loss, tremor, confusion, depression, anxiety  ALLERGIES: Allergies  Allergen Reactions  . Codeine Other (See Comments)    Agitation/mean     HOME MEDICATIONS: Outpatient Prescriptions Prior to Visit  Medication Sig Dispense Refill  . ALPRAZolam (XANAX) 1 MG tablet Take 1 mg by mouth 3 (three) times daily.    Marland Kitchen amLODipine (NORVASC) 5 MG tablet Take 5 mg by mouth daily.    Marland Kitchen atorvastatin (LIPITOR) 20 MG tablet Take 20 mg by mouth at bedtime.    . carbidopa-levodopa (SINEMET IR) 25-100 MG tablet TAKE 1 TABLET BY MOUTH 3 (THREE) TIMES DAILY. 270 tablet 0  . gabapentin (NEURONTIN) 400 MG capsule Take 1 capsule (400  mg total) by mouth 2 (two) times daily. 60 capsule 2  . levothyroxine (SYNTHROID, LEVOTHROID) 75 MCG tablet Take 75 mcg by mouth daily before breakfast.    . lithium carbonate 300 MG capsule Take 300 mg by mouth 2 (two) times daily with a meal.     . risperidone (RISPERDAL) 4 MG tablet 4 mg at bedtime.     No facility-administered medications prior to visit.    PAST MEDICAL HISTORY: Past Medical History  Diagnosis Date  . High blood pressure   . Tremor     PAST SURGICAL  HISTORY: Past Surgical History  Procedure Laterality Date  . Hemorrhoid surgery      1980's  . Multiple tooth extractions      2016 Has top plate    FAMILY HISTORY: Family History  Problem Relation Age of Onset  . Heart attack Mother   . Liver disease Father     SOCIAL HISTORY: Social History   Social History  . Marital Status: Married    Spouse Name: [name]  . Number of Children: 1  . Years of Education: 12   Occupational History  .      Not working   Social History Main Topics  . Smoking status: Current Every Day Smoker -- 1.00 packs/day    Types: Cigarettes  . Smokeless tobacco: Never Used     Comment: One Pack daily  . Alcohol Use: No  . Drug Use: No     Comment: Quit 2009  . Sexual Activity: Not on file   Other Topics Concern  . Not on file   Social History Narrative   Patient lives at home with with his wife [name]). Patient is not working at this time. Patient has a 12th grade education.    Caffeine - four mountain dews daily.   Right handed.   Patient has one child.     PHYSICAL EXAM  Filed Vitals:   10/15/15 1526  BP: 117/78  Pulse: 72  Height: 6' (1.829 m)  Weight: 139 lb (63.05 kg)   Body mass index is 18.85 kg/(m^2).    PHYSICAL EXAMNIATION:  Gen: NAD, conversant, well nourised, obese, well groomed                     Cardiovascular: Regular rate rhythm, no peripheral edema, warm, nontender. Eyes: Conjunctivae clear without exudates or hemorrhage Neck: Supple, no carotid bruise. Pulmonary: Clear to auscultation bilaterally   NEUROLOGICAL EXAM:  MENTAL STATUS: Speech:    Speech is normal; fluent and spontaneous with normal comprehension.  Cognition:     Orientation to time, place and person     Normal recent and remote memory     Normal Attention span and concentration     Normal Language, naming, repeating,spontaneous speech     Fund of knowledge   CRANIAL NERVES: CN II: Visual fields are full to confrontation. Fundoscopic  exam is normal with sharp discs and no vascular changes. Pupils are round equal and briskly reactive to light. CN III, IV, VI: extraocular movement are normal. No ptosis. CN V: Facial sensation is intact to pinprick in all 3 divisions bilaterally. Corneal responses are intact.  CN VII: he has mild bilateral eye closure, cheek puff weakness. CN VIII: Hearing is normal to rubbing fingers CN IX, X: Palate elevates symmetrically. Phonation is normal. CN XI: Head turning and shoulder shrug are intact CN XII: Tongue is midline with normal movements and no atrophy.  MOTOR:  He has  mild lateral shoulder at the ankle dorsiflexion weakness  REFLEXES: Reflexes are 2+ and symmetric at the biceps, triceps, knees, and ankles. Plantar responses are flexor.  SENSORY: Intact to light touch, pinprick, position sense, and vibration sense are intact in fingers and toes.  COORDINATION: Rapid alternating movements and fine finger movements are intact. There is no dysmetria on finger-to-nose and heel-knee-shin.    GAIT/STANCE: Decreased arm swing, mildly unsteady     DIAGNOSTIC DATA (LABS, IMAGING, TESTING) -ASSESSMENT AND PLAN  54 y.o. year old male  has a past medical history of High blood pressure and Tremor. here to follow-up. 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 Ph.D.  [facility] [place], [place] [address] Phone: [phone] Fax:      [phone]

## 2015-10-30 ENCOUNTER — Ambulatory Visit (INDEPENDENT_AMBULATORY_CARE_PROVIDER_SITE_OTHER): Payer: Self-pay | Admitting: Neurology

## 2015-10-30 ENCOUNTER — Ambulatory Visit (INDEPENDENT_AMBULATORY_CARE_PROVIDER_SITE_OTHER): Payer: BLUE CROSS/BLUE SHIELD | Admitting: Neurology

## 2015-10-30 DIAGNOSIS — R531 Weakness: Secondary | ICD-10-CM

## 2015-10-30 DIAGNOSIS — G2119 Other drug induced secondary parkinsonism: Secondary | ICD-10-CM

## 2015-10-30 DIAGNOSIS — R269 Unspecified abnormalities of gait and mobility: Secondary | ICD-10-CM

## 2015-10-30 DIAGNOSIS — R251 Tremor, unspecified: Secondary | ICD-10-CM

## 2015-10-30 DIAGNOSIS — Z0289 Encounter for other administrative examinations: Secondary | ICD-10-CM

## 2015-10-30 NOTE — Progress Notes (Signed)
He returned for electrodiagnostic study today for evaluation of his generalized weakness, gait difficulty  There was no evidence of peripheral neuropathy, no evidence of myopathy, lumbosacral radiculopathy.  Lab evaluation today. He has hypereflexia on exam, I have suggested MRI cervical. He wants to hold off.

## 2015-10-30 NOTE — Procedures (Signed)
   NCS (NERVE CONDUCTION STUDY) WITH EMG (ELECTROMYOGRAPHY) REPORT   STUDY DATE: October 30 2015 PATIENT NAME: [name] DOB: January 02, 1961 MRN: [id number]    TECHNOLOGIST: Laretta Alstrom ELECTROMYOGRAPHER: Marcial Pacas M.D.  CLINICAL INFORMATION:  54 years old male with severe depression, on polypharmacy treatment, presenting with generalized weakness, wasting, complains of gait difficulty  On examination: Very depressed looking middle-age male, no significant proximal or distal upper and lower extremity weakness, deep tendon reflexes were present and symmetric.  FINDINGS: NERVE CONDUCTION STUDY: Bilateral peroneal sensory responses were normal. Bilateral peroneal to EDB and tibial motor responses were normal. Bilateral tibial H reflexes were normal and symmetric.  Bilateral median, ulnar sensory and motor responses were normal.  NEEDLE ELECTROMYOGRAPHY: Selected needle examination was performed at right upper, right lower extremity muscles, right cervical paraspinal, right lumbar sacral paraspinal muscles.  Needle examination of right biceps, triceps, deltoid, pronator teres, extensor digitorum communis was normal.  There was no spontaneous activity at right cervical paraspinal muscles, right C5-6 and 7  Selected needle examination of right vastus lateralis, tibialis anterior, tibialis posterior, medial gastrocnemius was normal.   There was no spontaneous activity at right lumbar sacral paraspinal muscles, right L4-5 S1.  Conclusion: This is a normal study. There is no electrodiagnostic evidence of large fiber peripheral neuropathy, right lumbar, cervical radiculopathy, or myopathic changes.  INTERPRETING PHYSICIAN:   Marcial Pacas M.D. Ph.D. [facility] [address], [place],  [address] [phone]

## 2015-10-31 ENCOUNTER — Ambulatory Visit: Payer: BLUE CROSS/BLUE SHIELD | Admitting: Neurology

## 2015-11-01 LAB — COMPREHENSIVE METABOLIC PANEL
ALT: 7 IU/L (ref 0–44)
AST: 14 IU/L (ref 0–40)
Albumin/Globulin Ratio: 2.2 (ref 1.1–2.5)
Albumin: 4.8 g/dL (ref 3.5–5.5)
Alkaline Phosphatase: 102 IU/L (ref 39–117)
BUN/Creatinine Ratio: 13 (ref 9–20)
BUN: 12 mg/dL (ref 6–24)
Bilirubin Total: 0.4 mg/dL (ref 0.0–1.2)
CO2: 23 mmol/L (ref 18–29)
Calcium: 9.6 mg/dL (ref 8.7–10.2)
Chloride: 103 mmol/L (ref 96–106)
Creatinine, Ser: 0.94 mg/dL (ref 0.76–1.27)
GFR calc Af Amer: 105 mL/min/{1.73_m2} (ref >59)
GFR calc non Af Amer: 91 mL/min/{1.73_m2} (ref >59)
Globulin, Total: 2.2 g/dL (ref 1.5–4.5)
Glucose: 86 mg/dL (ref 65–99)
Potassium: 3.9 mmol/L (ref 3.5–5.2)
Sodium: 143 mmol/L (ref 134–144)
Total Protein: 7 g/dL (ref 6.0–8.5)

## 2015-11-01 LAB — ANA W/REFLEX IF POSITIVE: Anti Nuclear Antibody(ANA): NEGATIVE

## 2015-11-01 LAB — CBC WITH DIFFERENTIAL
BASOS ABS: 0 10*3/uL (ref 0.0–0.2)
Basos: 0 %
EOS (ABSOLUTE): 0.4 10*3/uL (ref 0.0–0.4)
Eos: 4 %
HEMOGLOBIN: 14.5 g/dL (ref 12.6–17.7)
Hematocrit: 44 % (ref 37.5–51.0)
IMMATURE GRANULOCYTES: 0 %
Immature Grans (Abs): 0 10*3/uL (ref 0.0–0.1)
LYMPHS ABS: 1.8 10*3/uL (ref 0.7–3.1)
Lymphs: 18 %
MCH: 30 pg (ref 26.6–33.0)
MCHC: 33 g/dL (ref 31.5–35.7)
MCV: 91 fL (ref 79–97)
MONOCYTES: 9 %
MONOS ABS: 0.9 10*3/uL (ref 0.1–0.9)
NEUTROS ABS: 6.8 10*3/uL (ref 1.4–7.0)
Neutrophils: 69 %
RBC: 4.83 x10E6/uL (ref 4.14–5.80)
RDW: 13 % (ref 12.3–15.4)
WBC: 9.9 10*3/uL (ref 3.4–10.8)

## 2015-11-01 LAB — RPR: RPR: NONREACTIVE

## 2015-11-01 LAB — THYROID PANEL WITH TSH
FREE THYROXINE INDEX: 3.3 (ref 1.2–4.9)
T3 UPTAKE RATIO: 26 % (ref 24–39)
T4 TOTAL: 12.7 ug/dL — AB (ref 4.5–12.0)
TSH: 3.94 u[IU]/mL (ref 0.450–4.500)

## 2015-11-01 LAB — CK: CK TOTAL: 53 U/L (ref 24–204)

## 2015-11-01 LAB — COPPER, SERUM: Copper: 117 ug/dL (ref 72–166)

## 2015-11-01 LAB — VITAMIN B12: VITAMIN B 12: 640 pg/mL (ref 211–946)

## 2015-11-01 LAB — C-REACTIVE PROTEIN

## 2015-11-06 ENCOUNTER — Encounter: Payer: BLUE CROSS/BLUE SHIELD | Admitting: Neurology

## 2015-11-13 ENCOUNTER — Ambulatory Visit (INDEPENDENT_AMBULATORY_CARE_PROVIDER_SITE_OTHER): Payer: BLUE CROSS/BLUE SHIELD | Admitting: Podiatry

## 2015-11-13 ENCOUNTER — Encounter: Payer: Self-pay | Admitting: Podiatry

## 2015-11-13 DIAGNOSIS — L84 Corns and callosities: Secondary | ICD-10-CM | POA: Diagnosis not present

## 2015-11-13 DIAGNOSIS — M79606 Pain in leg, unspecified: Secondary | ICD-10-CM

## 2015-11-13 DIAGNOSIS — M79673 Pain in unspecified foot: Secondary | ICD-10-CM | POA: Diagnosis not present

## 2015-11-13 DIAGNOSIS — B351 Tinea unguium: Secondary | ICD-10-CM | POA: Diagnosis not present

## 2015-11-14 NOTE — Progress Notes (Signed)
Subjective:     Patient ID: [name], male   DOB: 1961/08/07, 54 y.o.   MRN: [id number]  HPI patient presents stating I'm still getting discomfort in the outside of his right foot and my nails are thick and incurvated in the corners and I cannot cut them myself due to thickness and my Parkinson's   Review of Systems     Objective:   Physical Exam Neurovascular is unchanged with patient found to have lesion on the lateral aspect of the right fifth metatarsal that is localized in nature with crusted tissue and is found to have elongated thickened nailbeds 1-5 both feet that are painful in the corners    Assessment:     Mycotic nail infection with pain 1-5 both feet and lesion on the lateral side of the right foot    Plan:     Discussed both conditions and did sharp debridement of lesions and a applied a small amount of Iodosorb with dressing as it was a small amount of discharge. Instructed on soaks and that this still may ultimately require surgery if it remains a problem and today I debrided nailbeds 1-5 both feet with no iatrogenic bleeding noted of painful nails

## 2015-11-22 ENCOUNTER — Telehealth: Payer: Self-pay | Admitting: Acute Care

## 2015-11-22 NOTE — Telephone Encounter (Signed)
Left Message to make Appointment.  x3 Sent letter   Dear Mr. Lamberti, We have attempted to call you several times to schedule the lung screening Dr. Ardeth Perfect requested you have performed. We have been unable to contact you by phone. Please call the number below at your earliest convenience so that we can get you scheduled for your screening. We look forward to participating in your care.  Thank you,  [facility] [phone]

## 2016-01-24 ENCOUNTER — Ambulatory Visit: Payer: BLUE CROSS/BLUE SHIELD | Admitting: Podiatry

## 2016-01-27 ENCOUNTER — Ambulatory Visit: Payer: BLUE CROSS/BLUE SHIELD | Admitting: Podiatry

## 2016-01-28 ENCOUNTER — Ambulatory Visit (INDEPENDENT_AMBULATORY_CARE_PROVIDER_SITE_OTHER): Payer: Commercial Managed Care - HMO | Admitting: Neurology

## 2016-01-28 ENCOUNTER — Encounter: Payer: Self-pay | Admitting: Neurology

## 2016-01-28 VITALS — BP 102/69 | HR 62 | Ht 72.0 in | Wt 135.0 lb

## 2016-01-28 DIAGNOSIS — R251 Tremor, unspecified: Secondary | ICD-10-CM | POA: Diagnosis not present

## 2016-01-28 DIAGNOSIS — R269 Unspecified abnormalities of gait and mobility: Secondary | ICD-10-CM | POA: Diagnosis not present

## 2016-01-28 DIAGNOSIS — G2119 Other drug induced secondary parkinsonism: Secondary | ICD-10-CM

## 2016-01-28 NOTE — Progress Notes (Signed)
Chief Complaint  Patient presents with  . Extremity Weakness    He is here with his wife, [name].  They would like to review his NCV/EMG results.  . Parkinsonian Features    Feels he is doing will on Sinemet.  Tremors have improved since starting the medication.     [facility]  PATIENT: [name] DOB: 1961/08/30   REASON FOR VISIT: Follow-up for tremor and parkinsonism HISTORY FROM: Patient  HISTORY OF PRESENT ILLNESS:He has PMHx of HTN, hypothyroidism, on supplement, bipolar disorder, taking risperidone 4 mg daily for more than ten years, lithium 300mg  tid, report level was within normal limit, he is also taking alprazolam 1mg  tid. His psychiatrist is Dr. Candis Schatz, He had a lot of stress recently, his son died after struck by a truck in August 2013, his wife suffered stroke in August 2011.  He complains of bilateral hand tremor over the past few years, getting worse since 2013, fairly symmtric, if he holds anything more than 5 Lbs, for more than 10 minutes, he has right arm and hand shaking, he spills water when holding a glass of water. He denies significant gait difficulty, denies loss sense of smell. He denies family history of tremor.  Lab in July 25th 2014, showed normal CBC.   MRI of the brain done 07/19/2013 without acute findings. His tremor gets worse if he is anxious.  UPDATE Sep 9th 2015:He has gradually declining functional status, he is no longer working, sit at home most of the time, is no longer driving, he was recently evaluated by his psychiatrist Dr. Candis Schatz in June 2015, Lithium was decreased from 900mg  daily to 600 every day, he is also taking Risperdal 4 mg every day He has worsening memory trouble, constipation, restless he also reported worsening depression, to the point of suicidal thought sometimes   UPDATE Oct 26 2014: Sinemet 25/100 3 times a day since Sep 2015, has been very helpful, his tremor has much improved, he continue  has mild unsteady gait, recently complains of bilateral heel pain, thick skin at left medial heel.He is still taking Risperdal 4 mg every night, Xanax 1 mg half to one tablet as needed, lithium 300 mg 2 tablets every night, Synthroid 75 g once every day  UPDATE Oct 15 2015: His tremor is better with sinemet,  He complains of right foot pain, planning on to have surgery soon. Today his wife is also concerned about his slow worsening gait difficulty, unsteady gait, difficulty to clear his feet from the floor, worsening bilateral upper extremity weakness, bilateral deltoid muscle wasting   UPDATE January 28 2016: EMG nerve conduction study in October 30 2015 was normal, there was no evidence of large fiber peripheral neuropathy, right lumbar or right cervical radiculopathy. He is planning on to have right foot surgery for benign mass in January 30 2016  He is still on Sinemet 25/100 mg 3 times a day, which does help his tremor  REVIEW OF SYSTEMS: Full 14 system review of systems performed and notable only for those listed, all others are neg: Ringing ears, including, choking  ALLERGIES: Allergies  Allergen Reactions  . Codeine Other (See Comments)    Agitation/mean     HOME MEDICATIONS: Outpatient Prescriptions Prior to Visit  Medication Sig Dispense Refill  . ALPRAZolam (XANAX) 1 MG tablet Take 1 mg by mouth 3 (three) times daily.    Marland Kitchen amLODipine (NORVASC) 5 MG tablet Take 5 mg by mouth daily.    Marland Kitchen atorvastatin (LIPITOR)  20 MG tablet Take 20 mg by mouth at bedtime.    . carbidopa-levodopa (SINEMET IR) 25-100 MG tablet Take 1 tablet by mouth 3 (three) times daily. 270 tablet 3  . gabapentin (NEURONTIN) 400 MG capsule Take 1 capsule (400 mg total) by mouth 2 (two) times daily. 60 capsule 2  . levothyroxine (SYNTHROID, LEVOTHROID) 75 MCG tablet Take 75 mcg by mouth daily before breakfast.    . lithium carbonate 300 MG capsule Take 300 mg by mouth 2 (two) times daily with a meal.     .  risperidone (RISPERDAL) 4 MG tablet 4 mg at bedtime.     No facility-administered medications prior to visit.    PAST MEDICAL HISTORY: Past Medical History  Diagnosis Date  . High blood pressure   . Tremor     PAST SURGICAL HISTORY: Past Surgical History  Procedure Laterality Date  . Hemorrhoid surgery      1980's  . Multiple tooth extractions      2016 Has top plate    FAMILY HISTORY: Family History  Problem Relation Age of Onset  . Heart attack Mother   . Liver disease Father     SOCIAL HISTORY: Social History   Social History  . Marital Status: Married    Spouse Name: [name]  . Number of Children: 1  . Years of Education: 12   Occupational History  .      Not working   Social History Main Topics  . Smoking status: Current Every Day Smoker -- 1.00 packs/day    Types: Cigarettes  . Smokeless tobacco: Never Used     Comment: One Pack daily  . Alcohol Use: No  . Drug Use: No     Comment: Quit 2009  . Sexual Activity: Not on file   Other Topics Concern  . Not on file   Social History Narrative   Patient lives at home with with his wife [name]). Patient is not working at this time. Patient has a 12th grade education.    Caffeine - four mountain dews daily.   Right handed.   Patient has one child.     PHYSICAL EXAM  Filed Vitals:   01/28/16 0817  BP: 102/69  Pulse: 62  Height: 6' (1.829 m)  Weight: 135 lb (61.236 kg)   Body mass index is 18.31 kg/(m^2).    PHYSICAL EXAMNIATION:  Gen: NAD, conversant, well nourised, obese, well groomed                     Cardiovascular: Regular rate rhythm, no peripheral edema, warm, nontender. Eyes: Conjunctivae clear without exudates or hemorrhage Neck: Supple, no carotid bruise. Pulmonary: Clear to auscultation bilaterally   NEUROLOGICAL EXAM:  MENTAL STATUS: Speech:    Speech is normal; fluent and spontaneous with normal comprehension.  Cognition:     Orientation to time, place and person      Normal recent and remote memory     Normal Attention span and concentration     Normal Language, naming, repeating,spontaneous speech     Fund of knowledge   CRANIAL NERVES: CN II: Visual fields are full to confrontation. Pupils are round equal and briskly reactive to light. CN III, IV, VI: extraocular movement are normal. No ptosis. CN V: Facial sensation is intact to pinprick in all 3 divisions bilaterally. Corneal responses are intact.  CN VII: he has mild bilateral eye closure, cheek puff weakness. CN VIII: Hearing is normal to rubbing fingers CN IX,  X: Palate elevates symmetrically. Phonation is normal. CN XI: Head turning and shoulder shrug are intact CN XII: Tongue is midline with normal movements and no atrophy.  MOTOR: He has mild symmetric bilateral upper and nuchal rigidity, no significant weakness  REFLEXES: Reflexes are 2+ and symmetric at the biceps, triceps, knees, and ankles. Plantar responses are flexor.  SENSORY: Intact to light touch, pinprick, position sense, and vibration sense are intact in fingers and toes.  COORDINATION: Rapid alternating movements and fine finger movements are intact. There is no dysmetria on finger-to-nose and heel-knee-shin.    GAIT/STANCE: Decreased arm swing, mildly unsteady     DIAGNOSTIC DATA (LABS, IMAGING, TESTING) -ASSESSMENT AND PLAN  55 y.o. year old male  has a past medical history of High blood pressure and Tremor.   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 Ph.D.  [facility] [place], [place]  [address] Phone: [phone] Fax:      [phone]

## 2016-01-30 ENCOUNTER — Encounter: Payer: Self-pay | Admitting: Podiatry

## 2016-01-30 ENCOUNTER — Ambulatory Visit (INDEPENDENT_AMBULATORY_CARE_PROVIDER_SITE_OTHER): Payer: Commercial Managed Care - HMO | Admitting: Podiatry

## 2016-01-30 ENCOUNTER — Telehealth: Payer: Self-pay | Admitting: *Deleted

## 2016-01-30 ENCOUNTER — Ambulatory Visit (INDEPENDENT_AMBULATORY_CARE_PROVIDER_SITE_OTHER): Payer: Commercial Managed Care - HMO

## 2016-01-30 DIAGNOSIS — L84 Corns and callosities: Secondary | ICD-10-CM

## 2016-01-30 DIAGNOSIS — B351 Tinea unguium: Secondary | ICD-10-CM | POA: Diagnosis not present

## 2016-01-30 DIAGNOSIS — D169 Benign neoplasm of bone and articular cartilage, unspecified: Secondary | ICD-10-CM

## 2016-01-30 DIAGNOSIS — M79606 Pain in leg, unspecified: Secondary | ICD-10-CM | POA: Diagnosis not present

## 2016-01-30 NOTE — Progress Notes (Signed)
Subjective:     Patient ID: [name], male   DOB: 02-25-1961, 55 y.o.   MRN: [id number]  HPI patient presents with his wife stating that this keeps reoccurring and he cannot walk or wear shoe gear comfortably and we want to have surgery   Review of Systems     Objective:   Physical Exam Neurovascular status was found to be intact with good digital perfusion and well oriented 3. There continues to be irritation of tissue on the base of the fifth metatarsal lateral side with prominence of the fifth metatarsal base creating a consistent inflammatory area and irritation with no proximal edema erythema drainage noted at this time    Assessment:     Enlargement around the base of the fifth metatarsal head right creating inflammation and pain and keratotic tissue that has been constantly irritated for over a year    Plan:     X-rays and condition reviewed with patient. We discussed at great length the causes of this and the considerations for smoothing the bone down on the side to take pressure off of it. Patient wants surgery as does his wife and at this time I allowed him to read a consent form reviewing correction and I spent a great of time going over that there is absolutely no long-term guarantees this will solve the problem and further problems may develop in the future and ultimately some form of a limitation may be necessary. They understand this completely wants surgery and understand risk and signs consent form and at this time patient is scheduled for outpatient surgery in the next 10 days. I debrided the lesion I applied padding around it which I want them to use exclusively until we did the surgery in 10 days

## 2016-01-30 NOTE — Patient Instructions (Signed)
Pre-Operative Instructions  Congratulations, you have decided to take an important step to improving your quality of life.  You can be assured that the doctors of [facility] will be with you every step of the way.  1. Plan to be at the surgery center/hospital at least 1 (one) hour prior to your scheduled time unless otherwise directed by the surgical center/hospital staff.  You must have a responsible adult accompany you, remain during the surgery and drive you home.  Make sure you have directions to the surgical center/hospital and know how to get there on time. 2. For hospital based surgery you will need to obtain a history and physical form from your family physician within 1 month prior to the date of surgery- we will give you a form for you primary physician.  3. We make every effort to accommodate the date you request for surgery.  There are however, times where surgery dates or times have to be moved.  We will contact you as soon as possible if a change in schedule is required.   4. No Aspirin/Ibuprofen for one week before surgery.  If you are on aspirin, any non-steroidal anti-inflammatory medications (Mobic, Aleve, Ibuprofen) you should stop taking it 7 days prior to your surgery.  You make take Tylenol  For pain prior to surgery.  5. Medications- If you are taking daily heart and blood pressure medications, seizure, reflux, allergy, asthma, anxiety, pain or diabetes medications, make sure the surgery center/hospital is aware before the day of surgery so they may notify you which medications to take or avoid the day of surgery. 6. No food or drink after midnight the night before surgery unless directed otherwise by surgical center/hospital staff. 7. No alcoholic beverages 24 hours prior to surgery.  No smoking 24 hours prior to or 24 hours after surgery. 8. Wear loose pants or shorts- loose enough to fit over bandages, boots, and casts. 9. No slip on shoes, sneakers are best. 10. Bring  your boot with you to the surgery center/hospital.  Also bring crutches or a walker if your physician has prescribed it for you.  If you do not have this equipment, it will be provided for you after surgery. 11. If you have not been contracted by the surgery center/hospital by the day before your surgery, call to confirm the date and time of your surgery. 12. Leave-time from work may vary depending on the type of surgery you have.  Appropriate arrangements should be made prior to surgery with your employer. 13. Prescriptions will be provided immediately following surgery by your doctor.  Have these filled as soon as possible after surgery and take the medication as directed. 14. Remove nail polish on the operative foot. 15. Wash the night before surgery.  The night before surgery wash the foot and leg well with the antibacterial soap provided and water paying special attention to beneath the toenails and in between the toes.  Rinse thoroughly with water and dry well with a towel.  Perform this wash unless told not to do so by your physician.  Enclosed: 1 Ice pack (please put in freezer the night before surgery)   1 Hibiclens skin cleaner   Pre-op Instructions  If you have any questions regarding the instructions, do not hesitate to call our office.  [place]: [address] [place], [place] [address] [phone]  [place]: [address] [place], [place] [address] [phone]  [place]: [address]  [place], [place] [address] [phone]  Dr. Richard   Tuchman DPM, Dr. Norman Regal DPM Dr. Richard Sikora DPM, Dr. M. Todd Hyatt DPM, Dr. Kathryn Egerton DPM 

## 2016-01-30 NOTE — Telephone Encounter (Signed)
Authorization was obtained from [facility] for surgery scheduled for 02/11/2016, cpt code 28104.  Authorization number is [id number].  Authorization number was sent to [facility] at [facility].

## 2016-02-11 DIAGNOSIS — M25774 Osteophyte, right foot: Secondary | ICD-10-CM | POA: Diagnosis not present

## 2016-02-20 ENCOUNTER — Ambulatory Visit (INDEPENDENT_AMBULATORY_CARE_PROVIDER_SITE_OTHER): Payer: Commercial Managed Care - HMO

## 2016-02-20 ENCOUNTER — Ambulatory Visit (INDEPENDENT_AMBULATORY_CARE_PROVIDER_SITE_OTHER): Payer: Commercial Managed Care - HMO | Admitting: Podiatry

## 2016-02-20 DIAGNOSIS — Z9889 Other specified postprocedural states: Secondary | ICD-10-CM

## 2016-02-20 DIAGNOSIS — D169 Benign neoplasm of bone and articular cartilage, unspecified: Secondary | ICD-10-CM | POA: Diagnosis not present

## 2016-02-20 DIAGNOSIS — L02619 Cutaneous abscess of unspecified foot: Secondary | ICD-10-CM

## 2016-02-20 DIAGNOSIS — L03119 Cellulitis of unspecified part of limb: Secondary | ICD-10-CM | POA: Diagnosis not present

## 2016-02-20 MED ORDER — CEPHALEXIN 500 MG PO CAPS: 500.0000 mg | ORAL_CAPSULE | Freq: Three times a day (TID) | ORAL | Status: DC

## 2016-02-20 NOTE — Progress Notes (Signed)
Subjective:     Patient ID: [name], male   DOB: [birth year], 55 y.o.   MRN: [id number]  HPI patient states she's not having any current pain with his right foot and is able to walk small distances   Review of Systems     Objective:   Physical Exam Neurovascular status intact negative Homans sign noted with patient found to have a healing surgical site right lateral fifth metatarsal with some drainage in the center that's localized with no proximal edema erythema or drainage noted currently minimal discomfort noted    Assessment:     Irritated incision site right with patient who is in relatively poor health with no indications of active proximal infection process    Plan:     H&P and x-ray reviewed. Due to the fact that there is some localized drainage I did anesthetize years 31 Milligan times like Marcaine mixture cleaned that up and carefully debrided necrotic tissue. I did not note any proximal spread appears to be localized and I applied a small amount of Iodosorb under occlusion and then placing on Keflex 500 mg 3 times a day for the next 10 days. I gave strict instructions of any changes were to occur let us know if not we will see patient back in 2 weeks for suture removal  X-ray report indicate satisfactory resection of bone base of fifth metatarsal right

## 2016-02-21 ENCOUNTER — Other Ambulatory Visit: Payer: Self-pay

## 2016-02-26 ENCOUNTER — Telehealth: Payer: Self-pay | Admitting: *Deleted

## 2016-02-26 MED ORDER — AMOXICILLIN-POT CLAVULANATE 875-125 MG PO TABS: 1.0000 | ORAL_TABLET | Freq: Two times a day (BID) | ORAL | Status: DC

## 2016-02-26 NOTE — Telephone Encounter (Addendum)
Pt's wife, [name] states she needs instruction on what to do with pt's oozing foot, she knows they are leaving in the sutures for another week, and she is using the Dial soap cleanses without any improvement.  Dr. Paulla Dolly ordered stop Keflex and begin Augmentin, make sure Dial cleanses are rinsed and may use Bacitracin cream, instead of Neosporin.  Orders to pt's wife, [name].

## 2016-03-05 ENCOUNTER — Ambulatory Visit (INDEPENDENT_AMBULATORY_CARE_PROVIDER_SITE_OTHER): Payer: Commercial Managed Care - HMO | Admitting: Podiatry

## 2016-03-05 ENCOUNTER — Encounter: Payer: Self-pay | Admitting: Podiatry

## 2016-03-05 DIAGNOSIS — Z9889 Other specified postprocedural states: Secondary | ICD-10-CM

## 2016-03-05 DIAGNOSIS — D169 Benign neoplasm of bone and articular cartilage, unspecified: Secondary | ICD-10-CM

## 2016-03-05 NOTE — Progress Notes (Signed)
DOS 02/11/2016 Removal spur from outside right foot with possible wedge of tissue.

## 2016-03-05 NOTE — Progress Notes (Signed)
Subjective:     Patient ID: [name], male   DOB: 07-Aug-1961, 55 y.o.   MRN: [id number]  HPI patient presents stating that it is improving with minimal drainage and that area is crusted over from surgery   Review of Systems     Objective:   Physical Exam Neurovascular status intact well-healing surgical site lateral side right fifth metatarsal base with no current active drainage noted    Assessment:     Doing well post surgery right foot    Plan:     Stitches removed and wound edges coapted well with one small area in the center that still needs to heal. Applied Iodosorb to this to get it to dry out advised on continued padding therapy sterile dressing usage and finishing antibiotic and reappoint in 4 weeks or earlier if needed

## 2016-04-02 ENCOUNTER — Encounter: Payer: Self-pay | Admitting: Podiatry

## 2016-04-02 ENCOUNTER — Ambulatory Visit (INDEPENDENT_AMBULATORY_CARE_PROVIDER_SITE_OTHER): Payer: Medicare Other | Admitting: Podiatry

## 2016-04-02 ENCOUNTER — Ambulatory Visit (INDEPENDENT_AMBULATORY_CARE_PROVIDER_SITE_OTHER): Payer: Medicare Other

## 2016-04-02 DIAGNOSIS — Z9889 Other specified postprocedural states: Secondary | ICD-10-CM

## 2016-04-02 DIAGNOSIS — D169 Benign neoplasm of bone and articular cartilage, unspecified: Secondary | ICD-10-CM

## 2016-04-02 NOTE — Progress Notes (Signed)
Subjective:     Patient ID: [name], male   DOB: [birth year], 55 y.o.   MRN: [id number]  HPI patient states my right foot is doing real well with minimal discomfort and no drainage   Review of Systems     Objective:   Physical Exam Neurovascular status intact muscle strength adequate with a area on the    right fifth metatarsal healing well with a small amount of crusted tissue  Assessment:     Doing well post bone resection base of fifth metatarsal right     Plan:     Final x-ray reviewed and patient discharged and will be seen back as needed   X-ray indicated satisfactory section of bone with no indications of lysis

## 2016-07-18 ENCOUNTER — Other Ambulatory Visit: Payer: Self-pay | Admitting: Neurology

## 2016-07-30 ENCOUNTER — Ambulatory Visit: Payer: Medicare Other | Admitting: Neurology

## 2016-07-30 ENCOUNTER — Encounter: Payer: Self-pay | Admitting: Neurology

## 2016-07-30 VITALS — BP 108/71 | HR 88 | Ht 72.0 in | Wt 134.5 lb

## 2016-07-30 DIAGNOSIS — R269 Unspecified abnormalities of gait and mobility: Secondary | ICD-10-CM

## 2016-07-30 DIAGNOSIS — F319 Bipolar disorder, unspecified: Secondary | ICD-10-CM | POA: Insufficient documentation

## 2016-07-30 DIAGNOSIS — G2119 Other drug induced secondary parkinsonism: Secondary | ICD-10-CM | POA: Diagnosis not present

## 2016-07-30 DIAGNOSIS — R531 Weakness: Secondary | ICD-10-CM

## 2016-07-30 MED ORDER — CARBIDOPA-LEVODOPA 25-100 MG PO TABS: 1.0000 | ORAL_TABLET | Freq: Three times a day (TID) | ORAL | 4 refills | Status: DC

## 2016-07-30 NOTE — Progress Notes (Signed)
Chief Complaint  Patient presents with  . Parkinsonian Features    He is here with his wife, [name].  They are still concerned about fatigue and weakness.  Recently, his Lithium dose was increased from 600mg , QHS to 900mg , QHS.  Says his tremors have worsened with this increased dosage.     [facility]  PATIENT: [name] DOB: 05/02/1961   REASON FOR VISIT: Follow-up for tremor and parkinsonism HISTORY FROM: Patient  HISTORY OF PRESENT ILLNESS:He has PMHx of HTN, hypothyroidism, on supplement, bipolar disorder, taking risperidone 4 mg daily for more than ten years, lithium 300mg  tid, report level was within normal limit, he is also taking alprazolam 1mg  tid. His psychiatrist is Dr. Candis Schatz, He had a lot of stress recently, his son died after struck by a truck in August 2013, his wife suffered stroke in August 2011.  He complains of bilateral hand tremor over the past few years, getting worse since 2013, fairly symmtric, if he holds anything more than 5 Lbs, for more than 10 minutes, he has right arm and hand shaking, he spills water when holding a glass of water. He denies significant gait difficulty, denies loss sense of smell. He denies family history of tremor.  Lab in July 25th 2014, showed normal CBC.   MRI of the brain done 07/19/2013 without acute findings. His tremor gets worse if he is anxious.  UPDATE Sep 9th 2015:He has gradually declining functional status, he is no longer working, sit at home most of the time, is no longer driving, he was recently evaluated by his psychiatrist Dr. Candis Schatz in June 2015, Lithium was decreased from 900mg  daily to 600 every day, he is also taking Risperdal 4 mg every day He has worsening memory trouble, constipation, restless he also reported worsening depression, to the point of suicidal thought sometimes   UPDATE Oct 26 2014: Sinemet 25/100 3 times a day since Sep 2015, has been very helpful, his tremor has much  improved, he continue has mild unsteady gait, recently complains of bilateral heel pain, thick skin at left medial heel.He is still taking Risperdal 4 mg every night, Xanax 1 mg half to one tablet as needed, lithium 300 mg 2 tablets every night, Synthroid 75 g once every day  UPDATE Oct 15 2015: His tremor is better with sinemet,  He complains of right foot pain, planning on to have surgery soon. Today his wife is also concerned about his slow worsening gait difficulty, unsteady gait, difficulty to clear his feet from the floor, worsening bilateral upper extremity weakness, bilateral deltoid muscle wasting   UPDATE January 28 2016: EMG nerve conduction study in October 30 2015 was normal, there was no evidence of large fiber peripheral neuropathy, right lumbar or right cervical radiculopathy. He is planning on to have right foot surgery for benign mass in January 30 2016  He is still on Sinemet 25/100 mg 3 times a day, which does help his tremor  UPDATE Jul 30 2016: Patient reported increased tremor, bradykinesia, memory loss since his lithium was increased from 600 mg to 900 mg every night per family this is based on laboratory evaluation, there is no significant change on patient mood,  In addition he is also taking Risperdal 4 mg once every night, trazodone 50 mg every night, thyroid supplement. He is very frustrated about his slow movement, memory loss, bilateral hands tremor, cannot even clip his fingernails  He is under the care of [facility] [employer] Phone [phone])  [phone]  REVIEW OF SYSTEMS: Full 14 system review of systems performed and notable only for those listed, all others are neg: Constipation, tremor   ALLERGIES: Allergies  Allergen Reactions  . Codeine Other (See Comments)    Agitation/mean     HOME MEDICATIONS: Outpatient Medications Prior to Visit  Medication Sig Dispense Refill  . ALPRAZolam (XANAX) 1 MG tablet Take 1 mg by mouth 3 (three) times daily.     Marland Kitchen amLODipine (NORVASC) 5 MG tablet Take 5 mg by mouth daily.    Marland Kitchen atorvastatin (LIPITOR) 20 MG tablet Take 20 mg by mouth at bedtime.    . carbidopa-levodopa (SINEMET IR) 25-100 MG tablet TAKE 1 TABLET BY MOUTH 3 (THREE) TIMES DAILY. 270 tablet 3  . levothyroxine (SYNTHROID, LEVOTHROID) 75 MCG tablet Take 75 mcg by mouth daily before breakfast.    . lithium carbonate 300 MG capsule Take 900 mg by mouth at bedtime.     . risperidone (RISPERDAL) 4 MG tablet 4 mg at bedtime.    . traZODone (DESYREL) 50 MG tablet Take 1-2 tablets at bedtime.    Marland Kitchen amoxicillin-clavulanate (AUGMENTIN) 875-125 MG tablet Take 1 tablet by mouth 2 (two) times daily. 20 tablet 1  . cephALEXin (KEFLEX) 500 MG capsule Take 1 capsule (500 mg total) by mouth 3 (three) times daily. 30 capsule 1  . HYDROcodone-acetaminophen (NORCO) 10-325 MG tablet Take 1 tablet by mouth every 6 (six) hours as needed (one tablet every 4-6 hours prn foot pain).     No facility-administered medications prior to visit.     PAST MEDICAL HISTORY: Past Medical History:  Diagnosis Date  . High blood pressure   . Tremor     PAST SURGICAL HISTORY: Past Surgical History:  Procedure Laterality Date  . HEMORRHOID SURGERY     1980's  . MULTIPLE TOOTH EXTRACTIONS     2016 Has top plate    FAMILY HISTORY: Family History  Problem Relation Age of Onset  . Heart attack Mother   . Liver disease Father     SOCIAL HISTORY: Social History   Social History  . Marital status: Married    Spouse name: [name]  . Number of children: 1  . Years of education: 53   Occupational History  .      Not working   Social History Main Topics  . Smoking status: Current Every Day Smoker    Packs/day: 1.00    Types: Cigarettes  . Smokeless tobacco: Never Used     Comment: One Pack daily  . Alcohol use No  . Drug use: No     Comment: Quit 2009  . Sexual activity: Not on file   Other Topics Concern  . Not on file   Social History Narrative    Patient lives at home with with his wife [name]). Patient is not working at this time. Patient has a 12th grade education.    Caffeine - four mountain dews daily.   Right handed.   Patient has one child.     PHYSICAL EXAM  Vitals:   07/30/16 0720  BP: 108/71  Pulse: 88  Weight: 134 lb 8 oz (61 kg)  Height: 6' (1.829 m)   Body mass index is 18.24 kg/m.    PHYSICAL EXAMNIATION:  Gen: NAD, conversant, well nourised, obese, well groomed                     Cardiovascular: Regular rate rhythm, no peripheral edema, warm, nontender. Eyes:  Conjunctivae clear without exudates or hemorrhage Neck: Supple, no carotid bruise. Pulmonary: Clear to auscultation bilaterally   NEUROLOGICAL EXAM:  MENTAL STATUS: Speech:    Speech is normal; fluent and spontaneous with normal comprehension.  Cognition:     Orientation to time, place and person     Normal recent and remote memory     Normal Attention span and concentration     Normal Language, naming, repeating,spontaneous speech     Fund of knowledge   CRANIAL NERVES: CN II: Visual fields are full to confrontation. Pupils are round equal and briskly reactive to light. CN III, IV, VI: extraocular movement are normal. No ptosis. CN V: Facial sensation is intact to pinprick in all 3 divisions bilaterally. Corneal responses are intact.  CN VII: he has mild bilateral eye closure, cheek puff weakness. CN VIII: Hearing is normal to rubbing fingers CN IX, X: Palate elevates symmetrically. Phonation is normal. CN XI: Head turning and shoulder shrug are intact CN XII: Tongue is midline with normal movements and no atrophy.  MOTOR: He has mild symmetric bilateral upper and nuchal rigidity, no significant weakness  REFLEXES: Reflexes are 2+ and symmetric at the biceps, triceps, knees, and ankles. Plantar responses are flexor.  SENSORY: Intact to light touch, pinprick, position sense, and vibration sense are intact in fingers and  toes.  COORDINATION: Rapid alternating movements and fine finger movements are intact. There is no dysmetria on finger-to-nose and heel-knee-shin.    GAIT/STANCE: Decreased arm swing, mildly unsteady     DIAGNOSTIC DATA (LABS, IMAGING, TESTING) -ASSESSMENT AND PLAN  55 y.o. 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 Ph.D.  [facility] [place], [place] [address] Phone: [phone] Fax:      [phone]

## 2016-08-04 ENCOUNTER — Telehealth: Payer: Self-pay | Admitting: Neurology

## 2016-08-04 NOTE — Telephone Encounter (Signed)
I have discussed with his psychiatrist PA Donnal Moat, reported that patient showed no significant improvement in his mood with significant increase of his lithium from 600-900 mg based on laboratory evaluations, at the same time, he noticed worsening movement disorder, more slow, lack of initiative  Helene Kelp will come back to patient for potential adjustment of his psychotropic medications

## 2016-08-07 NOTE — Telephone Encounter (Signed)
error 

## 2016-10-02 ENCOUNTER — Other Ambulatory Visit: Payer: Self-pay | Admitting: Internal Medicine

## 2016-10-02 DIAGNOSIS — Z720 Tobacco use: Secondary | ICD-10-CM

## 2016-10-14 ENCOUNTER — Ambulatory Visit: Payer: BLUE CROSS/BLUE SHIELD | Admitting: Neurology

## 2016-10-23 ENCOUNTER — Ambulatory Visit
Admission: RE | Admit: 2016-10-23 | Discharge: 2016-10-23 | Disposition: A | Discharge status: Home / Self Care | Payer: Medicare Other | Source: Ambulatory Visit | Attending: Internal Medicine | Admitting: Internal Medicine

## 2016-10-23 DIAGNOSIS — Z720 Tobacco use: Secondary | ICD-10-CM

## 2017-04-22 ENCOUNTER — Other Ambulatory Visit: Payer: Self-pay | Admitting: Internal Medicine

## 2017-04-22 DIAGNOSIS — R911 Solitary pulmonary nodule: Secondary | ICD-10-CM

## 2017-04-27 ENCOUNTER — Ambulatory Visit
Admission: RE | Admit: 2017-04-27 | Discharge: 2017-04-27 | Disposition: A | Discharge status: Home / Self Care | Payer: Medicare Other | Source: Ambulatory Visit | Attending: Internal Medicine | Admitting: Internal Medicine

## 2017-04-27 DIAGNOSIS — R911 Solitary pulmonary nodule: Secondary | ICD-10-CM

## 2017-04-29 ENCOUNTER — Ambulatory Visit (INDEPENDENT_AMBULATORY_CARE_PROVIDER_SITE_OTHER): Payer: Medicare Other | Admitting: Neurology

## 2017-04-29 ENCOUNTER — Telehealth: Payer: Self-pay

## 2017-04-29 ENCOUNTER — Encounter: Payer: Self-pay | Admitting: Neurology

## 2017-04-29 VITALS — BP 121/74 | HR 90 | Ht 72.0 in | Wt 141.0 lb

## 2017-04-29 DIAGNOSIS — R269 Unspecified abnormalities of gait and mobility: Secondary | ICD-10-CM | POA: Diagnosis not present

## 2017-04-29 DIAGNOSIS — F319 Bipolar disorder, unspecified: Secondary | ICD-10-CM | POA: Diagnosis not present

## 2017-04-29 DIAGNOSIS — G2119 Other drug induced secondary parkinsonism: Secondary | ICD-10-CM

## 2017-04-29 DIAGNOSIS — R413 Other amnesia: Secondary | ICD-10-CM | POA: Insufficient documentation

## 2017-04-29 NOTE — Telephone Encounter (Signed)
Called and left mssg at [facility] requesting that Donnal Moat, PA-C return call to discuss patient w/ Dr. Krista Blue.

## 2017-04-29 NOTE — Progress Notes (Addendum)
Chief Complaint  Patient presents with  . Follow-up    PCP- Dr. Ardeth Perfect, pt getting slower, recovering from tick bite     [facility]  PATIENT: [name] DOB: 11-19-1960   REASON FOR VISIT: Follow-up for tremor and parkinsonism HISTORY FROM: Patient  HISTORY OF PRESENT ILLNESS:He has PMHx of HTN, hypothyroidism, on supplement, bipolar disorder, taking risperidone 4 mg daily for more than ten years, lithium 300mg  tid, report level was within normal limit, he is also taking alprazolam 1mg  tid. His psychiatrist is Dr. Candis Schatz, He had a lot of stress recently, his son died after struck by a truck in August 2013, his wife suffered stroke in August 2011.  He complains of bilateral hand tremor over the past few years, getting worse since 2013, fairly symmtric, if he holds anything more than 5 Lbs, for more than 10 minutes, he has right arm and hand shaking, he spills water when holding a glass of water. He denies significant gait difficulty, denies loss sense of smell. He denies family history of tremor.  Lab in July 25th 2014, showed normal CBC.   MRI of the brain done 07/19/2013 without acute findings. His tremor gets worse if he is anxious.  UPDATE Sep 9th 2015:He has gradually declining functional status, he is no longer working, sit at home most of the time, is no longer driving, he was recently evaluated by his psychiatrist Dr. Candis Schatz in June 2015, Lithium was decreased from 900mg  daily to 600 every day, he is also taking Risperdal 4 mg every day He has worsening memory trouble, constipation, restless he also reported worsening depression, to the point of suicidal thought sometimes   UPDATE Oct 26 2014: Sinemet 25/100 3 times a day since Sep 2015, has been very helpful, his tremor has much improved, he continue has mild unsteady gait, recently complains of bilateral heel pain, thick skin at left medial heel.He is still taking Risperdal 4 mg every night,  Xanax 1 mg half to one tablet as needed, lithium 300 mg 2 tablets every night, Synthroid 75 g once every day  UPDATE Oct 15 2015: His tremor is better with sinemet,  He complains of right foot pain, planning on to have surgery soon. Today his wife is also concerned about his slow worsening gait difficulty, unsteady gait, difficulty to clear his feet from the floor, worsening bilateral upper extremity weakness, bilateral deltoid muscle wasting   UPDATE January 28 2016: EMG nerve conduction study in October 30 2015 was normal, there was no evidence of large fiber peripheral neuropathy, right lumbar or right cervical radiculopathy. He is planning on to have right foot surgery for benign mass in January 30 2016  He is still on Sinemet 25/100 mg 3 times a day, which does help his tremor  UPDATE Jul 30 2016: Patient reported increased tremor, bradykinesia, memory loss since his lithium was increased from 600 mg to 900 mg every night per family this is based on laboratory evaluation, there is no significant change on patient mood,  In addition he is also taking Risperdal 4 mg once every night, trazodone 50 mg every night, thyroid supplement. He is very frustrated about his slow movement, memory loss, bilateral hands tremor, cannot even clip his fingernails  [facility] is under [facility] PA Donnal Moat Phone [phone]  UPDATE April 29 2017: He is with his wife, he has worsening memory loss, "just sit there",  Watch TV, he needs help dressing, bathing, wear depends.  He drinks 3 boost a day, lack of appetite, he sleeps well.  He is now back to lithium 600mg  daily,  He is so flat. His wife is going to retire April 30 2017 to take care of him.  This is a sudden change compared to March 05, 2012, he was a Librarian, academic of a warehouse, this a sudden change since his son died of accident in 05-Mar-2012.  He had diagnosis of bipolar disorder since March 05, 2008,   We have reviewed repeat CT chest without contrast  on June 12th 2018: Stable right lung base subpleural mass,  I was able to talk with his psychiatrist PA Donnal Moat,, updated on the information, and also concerned about medication side effect,   REVIEW OF SYSTEMS: Full 14 system review of systems performed and notable only for those listed, all others are neg:  As above.  ALLERGIES: Allergies  Allergen Reactions  . Codeine Other (See Comments)    Agitation/mean     HOME MEDICATIONS: Outpatient Medications Prior to Visit  Medication Sig Dispense Refill  . ALPRAZolam (XANAX) 1 MG tablet Take 1 mg by mouth 3 (three) times daily.    Marland Kitchen amLODipine (NORVASC) 5 MG tablet Take 5 mg by mouth daily.    Marland Kitchen atorvastatin (LIPITOR) 20 MG tablet Take 20 mg by mouth at bedtime.    . carbidopa-levodopa (SINEMET IR) 25-100 MG tablet Take 1 tablet by mouth 3 (three) times daily. 270 tablet 4  . levothyroxine (SYNTHROID, LEVOTHROID) 75 MCG tablet Take 75 mcg by mouth daily before breakfast.    . lithium carbonate 300 MG capsule Take 900 mg by mouth at bedtime.     . risperidone (RISPERDAL) 4 MG tablet 4 mg at bedtime.    . traZODone (DESYREL) 50 MG tablet Take 1-2 tablets at bedtime.     No facility-administered medications prior to visit.     PAST MEDICAL HISTORY: Past Medical History:  Diagnosis Date  . High blood pressure   . Tremor     PAST SURGICAL HISTORY: Past Surgical History:  Procedure Laterality Date  . HEMORRHOID SURGERY     1980's  . MULTIPLE TOOTH EXTRACTIONS     2015-03-06 Has top plate    FAMILY HISTORY: Family History  Problem Relation Age of Onset  . Heart attack Mother   . Liver disease Father     SOCIAL HISTORY: Social History   Social History  . Marital status: Married    Spouse name: [name]  . Number of children: 1  . Years of education: 81   Occupational History  .      Not working   Social History Main Topics  . Smoking status: Current Every Day Smoker    Packs/day: 1.00    Types: Cigarettes  .  Smokeless tobacco: Never Used     Comment: One Pack daily  . Alcohol use No  . Drug use: No     Comment: Quit Mar 05, 2008  . Sexual activity: Not on file   Other Topics Concern  . Not on file   Social History Narrative   Patient lives at home with with his wife [name]). Patient is not working at this time. Patient has a 12th grade education.    Caffeine - four mountain dews daily.   Right handed.   Patient has one child.     PHYSICAL EXAM  Vitals:   04/29/17 0730  BP: 121/74  Pulse: 90  Weight: 141 lb (64 kg)  Height: 6' (1.829 m)   Body  mass index is 19.12 kg/m.    PHYSICAL EXAMNIATION:  Gen: NAD, conversant, well nourised, obese, well groomed                     Cardiovascular: Regular rate rhythm, no peripheral edema, warm, nontender. Eyes: Conjunctivae clear without exudates or hemorrhage Neck: Supple, no carotid bruise. Pulmonary: Clear to auscultation bilaterally   NEUROLOGICAL EXAM:  MENTAL STATUS: Speech:    Speech is normal; fluent and spontaneous with normal comprehension.  Cognition:     Orientation to time, place and person     Normal recent and remote memory     Normal Attention span and concentration     Normal Language, naming, repeating,spontaneous speech     Fund of knowledge   CRANIAL NERVES: CN II: Visual fields are full to confrontation. Pupils are round equal and briskly reactive to light. CN III, IV, VI: extraocular movement are normal. No ptosis. CN V: Facial sensation is intact to pinprick in all 3 divisions bilaterally. Corneal responses are intact.  CN VII: he has mild bilateral eye closure, cheek puff weakness. CN VIII: Hearing is normal to rubbing fingers CN IX, X: Palate elevates symmetrically. Phonation is normal. CN XI: Head turning and shoulder shrug are intact CN XII: Tongue is midline with normal movements and no atrophy.  MOTOR: He has flat facial expression, mild to moderate symmetric bilateral upper and nuchal rigidity, no  significant weakness  REFLEXES: Reflexes are 2+ and symmetric at the biceps, triceps, knees, and ankles. Plantar responses are flexor.  SENSORY: Intact to light touch, pinprick, position sense, and vibration sense are intact in fingers and toes.  COORDINATION: Rapid alternating movements and fine finger movements are intact. There is no dysmetria on finger-to-nose and heel-knee-shin.    GAIT/STANCE: Decreased arm swing, mildly unsteady    DIAGNOSTIC DATA (LABS, IMAGING, TESTING) -ASSESSMENT AND PLAN  56 y.o. 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 Ph.D.  [facility] [place], [place] [address] Phone: [phone] Fax:      [phone]

## 2017-04-30 ENCOUNTER — Telehealth: Payer: Self-pay | Admitting: Neurology

## 2017-04-30 LAB — COMPREHENSIVE METABOLIC PANEL
ALT: 14 IU/L (ref 0–44)
AST: 12 IU/L (ref 0–40)
Albumin/Globulin Ratio: 1.8 (ref 1.2–2.2)
Albumin: 4.7 g/dL (ref 3.5–5.5)
Alkaline Phosphatase: 116 IU/L (ref 39–117)
BUN/Creatinine Ratio: 10 (ref 9–20)
BUN: 10 mg/dL (ref 6–24)
Bilirubin Total: 0.5 mg/dL (ref 0.0–1.2)
CALCIUM: 10.2 mg/dL (ref 8.7–10.2)
CO2: 26 mmol/L (ref 20–29)
CREATININE: 1 mg/dL (ref 0.76–1.27)
Chloride: 103 mmol/L (ref 96–106)
GFR calc Af Amer: 96 mL/min/{1.73_m2} (ref >59)
GFR, EST NON AFRICAN AMERICAN: 83 mL/min/{1.73_m2} (ref >59)
GLOBULIN, TOTAL: 2.6 g/dL (ref 1.5–4.5)
Glucose: 107 mg/dL — ABNORMAL HIGH (ref 65–99)
Potassium: 4.4 mmol/L (ref 3.5–5.2)
SODIUM: 143 mmol/L (ref 134–144)
Total Protein: 7.3 g/dL (ref 6.0–8.5)

## 2017-04-30 LAB — CBC WITH DIFFERENTIAL/PLATELET
BASOS ABS: 0 10*3/uL (ref 0.0–0.2)
Basos: 0 %
EOS (ABSOLUTE): 0.3 10*3/uL (ref 0.0–0.4)
Eos: 2 %
Hematocrit: 43.3 % (ref 37.5–51.0)
Hemoglobin: 14.2 g/dL (ref 13.0–17.7)
Immature Grans (Abs): 0 10*3/uL (ref 0.0–0.1)
Immature Granulocytes: 0 %
LYMPHS ABS: 1.7 10*3/uL (ref 0.7–3.1)
Lymphs: 14 %
MCH: 29.5 pg (ref 26.6–33.0)
MCHC: 32.8 g/dL (ref 31.5–35.7)
MCV: 90 fL (ref 79–97)
MONOS ABS: 0.9 10*3/uL (ref 0.1–0.9)
Monocytes: 8 %
NEUTROS ABS: 8.9 10*3/uL — AB (ref 1.4–7.0)
Neutrophils: 76 %
PLATELETS: 201 10*3/uL (ref 150–379)
RBC: 4.81 x10E6/uL (ref 4.14–5.80)
RDW: 13.2 % (ref 12.3–15.4)
WBC: 11.9 10*3/uL — AB (ref 3.4–10.8)

## 2017-04-30 LAB — CK: CK TOTAL: 52 U/L (ref 24–204)

## 2017-04-30 LAB — COPPER, SERUM: Copper: 119 ug/dL (ref 72–166)

## 2017-04-30 LAB — FOLATE: FOLATE: 4 ng/mL (ref >3.0)

## 2017-04-30 LAB — VITAMIN D 25 HYDROXY (VIT D DEFICIENCY, FRACTURES): Vit D, 25-Hydroxy: 18.7 ng/mL — ABNORMAL LOW (ref 30.0–100.0)

## 2017-04-30 LAB — VITAMIN B12: VITAMIN B 12: 782 pg/mL (ref 232–1245)

## 2017-04-30 LAB — TSH: TSH: 4.41 u[IU]/mL (ref 0.450–4.500)

## 2017-04-30 LAB — C-REACTIVE PROTEIN: CRP: 3.2 mg/L (ref 0.0–4.9)

## 2017-04-30 LAB — ANA W/REFLEX: Anti Nuclear Antibody(ANA): NEGATIVE

## 2017-04-30 LAB — SEDIMENTATION RATE: Sed Rate: 2 mm/hr (ref 0–30)

## 2017-04-30 LAB — HIV ANTIBODY (ROUTINE TESTING W REFLEX): HIV SCREEN 4TH GENERATION: NONREACTIVE

## 2017-04-30 NOTE — Telephone Encounter (Signed)
Please call patient laboratory evaluation showed low vitamin D 18.7, he should take over-the-counter vitamin D3 supplement 1000 units daily. Mild elevated WBC 11 point 9, ask him to see if he has any signs of infection such as UTI, upper respiratory infection,  If not, we will not repeat laboratory evaluation, if he has any signs of infection, may consider follow-up with primary care  I also talked with his psychiatrist PA Donnal Moat, consider medication adjustment in his next follow-up visit

## 2017-05-03 ENCOUNTER — Encounter: Payer: Self-pay | Admitting: *Deleted

## 2017-05-03 NOTE — Telephone Encounter (Signed)
Spoke to patient's wife on HIPAA - she is aware of below.  She will start him on the recommended supplement.  He has been congested but she does not feel the congestion is new.  She will follow up with his PCP.

## 2017-05-03 NOTE — Telephone Encounter (Signed)
Left message for his wife on HIPAA [name]) to return my call.

## 2017-05-09 ENCOUNTER — Ambulatory Visit
Admission: RE | Admit: 2017-05-09 | Discharge: 2017-05-09 | Disposition: A | Discharge status: Home / Self Care | Payer: Medicare Other | Source: Ambulatory Visit | Attending: Neurology | Admitting: Neurology

## 2017-05-09 DIAGNOSIS — G2119 Other drug induced secondary parkinsonism: Secondary | ICD-10-CM

## 2017-05-09 DIAGNOSIS — R269 Unspecified abnormalities of gait and mobility: Secondary | ICD-10-CM

## 2017-05-09 DIAGNOSIS — R413 Other amnesia: Secondary | ICD-10-CM

## 2017-05-09 DIAGNOSIS — F319 Bipolar disorder, unspecified: Secondary | ICD-10-CM

## 2017-07-27 ENCOUNTER — Encounter: Payer: Self-pay | Admitting: Neurology

## 2017-07-27 ENCOUNTER — Ambulatory Visit (INDEPENDENT_AMBULATORY_CARE_PROVIDER_SITE_OTHER): Payer: Medicare Other | Admitting: Neurology

## 2017-07-27 VITALS — BP 115/77 | HR 81 | Ht 72.0 in | Wt 144.5 lb

## 2017-07-27 DIAGNOSIS — R269 Unspecified abnormalities of gait and mobility: Secondary | ICD-10-CM

## 2017-07-27 DIAGNOSIS — G2119 Other drug induced secondary parkinsonism: Secondary | ICD-10-CM | POA: Diagnosis not present

## 2017-07-27 NOTE — Progress Notes (Signed)
Chief Complaint  Patient presents with  . Parkinsonism Features    MMSE 30/30 - 8 animals.  He is here with his wife, [name].  Reports his fatigue, gait and memory have worsened since last seen.     [facility]  PATIENT: [name] DOB: [birth year]   REASON FOR VISIT: Follow-up for tremor and parkinsonism HISTORY FROM: Patient  HISTORY OF PRESENT ILLNESS:He has PMHx of HTN, hypothyroidism, on supplement, bipolar disorder, taking risperidone 4 mg daily for more than ten years, lithium 359m tid, report level was within normal limit, he is also taking alprazolam 142mtid. His psychiatrist is Dr. CuCandis SchatzHe had a lot of stress recently, his son died after struck by a truck in August 2013, his wife suffered stroke in August 2011.  He complains of bilateral hand tremor over the past few years, getting worse since 2013, fairly symmtric, if he holds anything more than 5 Lbs, for more than 10 minutes, he has right arm and hand shaking, he spills water when holding a glass of water. He denies significant gait difficulty, denies loss sense of smell. He denies family history of tremor.  Lab in July 25th 2014, showed normal CBC.   MRI of the brain done 07/19/2013 without acute findings. His tremor gets worse if he is anxious.  UPDATE Sep 9th 2015:He has gradually declining functional status, he is no longer working, sit at home most of the time, is no longer driving, he was recently evaluated by his psychiatrist Dr. CuCandis Schatzn June 2015, Lithium was decreased from 90032maily to 600 every day, he is also taking Risperdal 4 mg every day He has worsening memory trouble, constipation, restless he also reported worsening depression, to the point of suicidal thought sometimes   UPDATE Oct 26 2014: Sinemet 25/100 3 times a day since Sep 2015, has been very helpful, his tremor has much improved, he continue has mild unsteady gait, recently complains of bilateral heel pain, thick  skin at left medial heel.He is still taking Risperdal 4 mg every night, Xanax 1 mg half to one tablet as needed, lithium 300 mg 2 tablets every night, Synthroid 75 g once every day  UPDATE Oct 15 2015: His tremor is better with sinemet,  He complains of right foot pain, planning on to have surgery soon. Today his wife is also concerned about his slow worsening gait difficulty, unsteady gait, difficulty to clear his feet from the floor, worsening bilateral upper extremity weakness, bilateral deltoid muscle wasting   UPDATE January 28 2016: EMG nerve conduction study in October 30 2015 was normal, there was no evidence of large fiber peripheral neuropathy, right lumbar or right cervical radiculopathy. He is planning on to have right foot surgery for benign mass in January 30 2016  He is still on Sinemet 25/100 mg 3 times a day, which does help his tremor  UPDATE Jul 30 2016: Patient reported increased tremor, bradykinesia, memory loss since his lithium was increased from 600 mg to 900 mg every night per family this is based on laboratory evaluation, there is no significant change on patient mood,  In addition he is also taking Risperdal 4 mg once every night, trazodone 50 mg every night, thyroid supplement. He is very frustrated about his slow movement, memory loss, bilateral hands tremor, cannot even clip his fingernails  He is under the care of [facility] psychiatrist PA TerDonnal Moatone [phone] April 29 2017: He is with his wife, he has worsening  memory loss, "just sit there",  Watch TV, he needs help dressing, bathing, wear depends.  He drinks 3 boost a day, lack of appetite, he sleeps well.  He is now back to lithium 648m daily,  He is so flat. His wife is going to retire June 57 2018 to take care of him.  This is a sudden change compared to 202-18-2013 he was a sLibrarian, academicof a warehouse, this a sudden change since his son died of accident in 202/18/2013  He had diagnosis of bipolar  disorder since 57/18/09   We have reviewed repeat CT chest without contrast on June 12th 2018: Stable right lung base subpleural mass,  I was able to talk with his psychiatrist PA TDonnal Moat, updated on the information, and also concerned about medication side effect  UPDATE Sept 11 2018: He is accompanied by his wife at today's clinical visit, wife is apparently very frustrated about his flat affect, lack of motivations, he needs more help in his daily activity, personal care  He is on lower dose of risperidone 345mqhs, trazodone 5030mhs,  Lithium 600m47ms, he continue to take Sinemet 25/100 mg 3 times a day, which has helped his hand tremor.  I personally reviewed MRI of the brain on May 09 2017, there is generalized atrophy mild supratentorial small vessel disease Extensive laboratory evaluation showed normal ESR, CPK, CBC showed mild elevated WBC 11 point 9, vitamin D was decreased 18 point 7, CMP showed mild elevated glucose 107  REVIEW OF SYSTEMS: Full 14 system review of systems performed and notable only for those listed, all others are neg:  As above.  ALLERGIES: Allergies  Allergen Reactions  . Codeine Other (See Comments)    Agitation/mean     HOME MEDICATIONS: Outpatient Medications Prior to Visit  Medication Sig Dispense Refill  . ALPRAZolam (XANAX) 1 MG tablet Take 1 mg by mouth 3 (three) times daily.    . amMarland KitchenODipine (NORVASC) 5 MG tablet Take 5 mg by mouth daily.    . atMarland Kitchenrvastatin (LIPITOR) 20 MG tablet Take 20 mg by mouth at bedtime.    . carbidopa-levodopa (SINEMET IR) 25-100 MG tablet Take 1 tablet by mouth 3 (three) times daily. 270 tablet 4  . Cholecalciferol (VITAMIN D3) 1000 units CAPS Take 1,000 Units by mouth daily.    . leMarland Kitchenothyroxine (SYNTHROID, LEVOTHROID) 75 MCG tablet Take 75 mcg by mouth daily before breakfast.    . lithium carbonate 300 MG capsule Take 600 mg by mouth at bedtime.     . mupirocin ointment (BACTROBAN) 2 % Place 1 application into the  nose 3 (three) times daily.    . traZODone (DESYREL) 50 MG tablet 50 mg. Take 1-2 tablets at bedtime.     . doMarland Kitchenycycline (DORYX) 100 MG EC tablet Take 100 mg by mouth 2 (two) times daily.    . risperidone (RISPERDAL) 4 MG tablet 4 mg at bedtime.     No facility-administered medications prior to visit.     PAST MEDICAL HISTORY: Past Medical History:  Diagnosis Date  . High blood pressure   . Tremor     PAST SURGICAL HISTORY: Past Surgical History:  Procedure Laterality Date  . HEMORRHOID SURGERY     1980's  . MULTIPLE TOOTH EXTRACTIONS     20162016/02/18 top plate    FAMILY HISTORY: Family History  Problem Relation Age of Onset  . Heart attack Mother   . Liver disease Father     SOCIAL HISTORY: Social History  Social History  . Marital status: Married    Spouse name: [name]  . Number of children: 1  . Years of education: 69   Occupational History  .      Not working   Social History Main Topics  . Smoking status: Current Every Day Smoker    Packs/day: 1.00    Types: Cigarettes  . Smokeless tobacco: Never Used     Comment: One Pack daily  . Alcohol use No  . Drug use: No     Comment: Quit 2009  . Sexual activity: Not on file   Other Topics Concern  . Not on file   Social History Narrative   Patient lives at home with with his wife [name]). Patient is not working at this time. Patient has a 12th grade education.    Caffeine - four mountain dews daily.   Right handed.   Patient has one child.     PHYSICAL EXAM  Vitals:   07/27/17 0924  BP: 115/77  Pulse: 81  Weight: 144 lb 8 oz (65.5 kg)  Height: 6' (1.829 m)   Body mass index is 19.6 kg/m.    PHYSICAL EXAMNIATION:  Gen: NAD, conversant, well nourised, obese, well groomed                     Cardiovascular: Regular rate rhythm, no peripheral edema, warm, nontender. Eyes: Conjunctivae clear without exudates or hemorrhage Neck: Supple, no carotid bruise. Pulmonary: Clear to auscultation  bilaterally   NEUROLOGICAL EXAM:  MENTAL STATUS: Speech:    Speech is normal; fluent and spontaneous with normal comprehension.  Cognition:     Orientation to time, place and person     Normal recent and remote memory     Normal Attention span and concentration     Normal Language, naming, repeating,spontaneous speech     Fund of knowledge   CRANIAL NERVES: CN II: Visual fields are full to confrontation. Pupils are round equal and briskly reactive to light. CN III, IV, VI: extraocular movement are normal. No ptosis. CN V: Facial sensation is intact to pinprick in all 3 divisions bilaterally. Corneal responses are intact.  CN VII: he has mild bilateral eye closure, cheek puff weakness. CN VIII: Hearing is normal to rubbing fingers CN IX, X: Palate elevates symmetrically. Phonation is normal. CN XI: Head turning and shoulder shrug are intact CN XII: Tongue is midline with normal movements and no atrophy.  MOTOR: He has flat facial expression, mild to moderate symmetric bilateral upper and nuchal rigidity, no significant weakness  REFLEXES: Reflexes are 2+ and symmetric at the biceps, triceps, knees, and ankles. Plantar responses are flexor.  SENSORY: Intact to light touch, pinprick, position sense, and vibration sense are intact in fingers and toes.  COORDINATION: Rapid alternating movements and fine finger movements are intact. There is no dysmetria on finger-to-nose and heel-knee-shin.    GAIT/STANCE: Decreased arm swing, mildly unsteady    DIAGNOSTIC DATA (LABS, IMAGING, TESTING) -ASSESSMENT AND PLAN  56 y.o. 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 Ph.D.  [facility] [place], [place] [address] Phone: [phone] Fax:      [phone]

## 2017-09-01 ENCOUNTER — Ambulatory Visit: Payer: Medicare Other | Attending: Neurology | Admitting: Physical Therapy

## 2017-09-01 DIAGNOSIS — R2689 Other abnormalities of gait and mobility: Secondary | ICD-10-CM | POA: Diagnosis present

## 2017-09-01 DIAGNOSIS — R293 Abnormal posture: Secondary | ICD-10-CM | POA: Insufficient documentation

## 2017-09-01 DIAGNOSIS — R2681 Unsteadiness on feet: Secondary | ICD-10-CM | POA: Insufficient documentation

## 2017-09-01 DIAGNOSIS — M6281 Muscle weakness (generalized): Secondary | ICD-10-CM | POA: Insufficient documentation

## 2017-09-01 NOTE — Patient Instructions (Signed)
   Walk along the hallway or a loop of rooms   -3 times per day (around meal time)  -3 minutes of walking each time (with wife's supervision)

## 2017-09-02 DIAGNOSIS — R2689 Other abnormalities of gait and mobility: Secondary | ICD-10-CM | POA: Diagnosis not present

## 2017-09-02 NOTE — Therapy (Signed)
[facility] [address] [place], [place], [address] Phone: [phone]   Fax:  [phone]  Physical Therapy Evaluation  Patient Details  Name: [name] MRN: [id number] Date of Birth: 1961/09/03 Referring Provider: Krista Blue  Encounter Date: 09/01/2017      PT End of Session - 09/02/17 1215    Visit Number 1   Number of Visits 9   Date for PT Re-Evaluation 11/01/17   Authorization Type UHC Medicare-GCODE every 10th visit   PT Start Time 0850   PT Stop Time 0933   PT Time Calculation (min) 43 min   Activity Tolerance Patient tolerated treatment well   Behavior During Therapy [facility] for tasks assessed/performed      Past Medical History:  Diagnosis Date  . High blood pressure   . Tremor     Past Surgical History:  Procedure Laterality Date  . HEMORRHOID SURGERY     1980's  . MULTIPLE TOOTH EXTRACTIONS     2016 Has top plate    There were no vitals filed for this visit.       Subjective Assessment - 09/01/17 0854    Subjective Pt has history of Parkinson's disease (secondary drug-induced) for 5 years.  More difficulty with walking and balance.  Wife reports 1 fall in the past 6 months, reaching down to bottom cabinet.  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            [facility] PT Assessment - 09/02/17 1208      Assessment   Medical Diagnosis gait instability, drug induced secondary Parkinsonism   Referring Provider Krista Blue   Onset Date/Surgical Date 07/27/17  MD visit     Precautions   Precautions Fall     Balance Screen   Has the patient fallen in the past 6 months Yes   How many times? 1   Has the patient had a decrease in activity level because of  a fear of falling?  Yes   Is the patient reluctant to leave their home because of a fear of falling?  Yes     [place] residence   Living Arrangements Spouse/significant other   Available Help at Discharge Family   Type of [place] to enter   Entrance Stairs-Number of Steps 2   [place] One level   [place] - single point;Tub bench     Prior Function   Level of [place] Needs assistance with ADLs;Needs assistance with gait   Leisure Wife reports "he just sits"; played golf, walked in the neighborhood, enjoyed yardwork, enjoyed fishing     Cognition   Overall Cognitive Status History of cognitive impairments - at baseline     Observation/Other Assessments   Focus on Therapeutic Outcomes (FOTO)  FOTO intake score 40; ABC scale score 10%     Posture/Postural Control   Posture/Postural Control Postural limitations   Postural Limitations Rounded Shoulders;Forward head     ROM / Strength   AROM / PROM / Strength Strength     Strength   Overall Strength Deficits   Overall Strength Comments Grossly tested 4/5 bilateral lower extremities, with  tremor noted with resistance     Transfers   Transfers Sit to Stand;Stand to Sit   Sit to Stand 5: Supervision;Without upper extremity assist;From chair/3-in-1   Five time sit to stand comments  24.53   Stand to Sit 5: Supervision;Without upper extremity assist;To chair/3-in-1     Ambulation/Gait   Ambulation/Gait Yes   Ambulation/Gait Assistance 5: Supervision   Ambulation Distance (Feet) 120 Feet   Assistive device None   Gait Pattern Step-through pattern;Decreased arm swing - right;Decreased arm swing - left;Decreased step length - right;Decreased step length - left;Decreased trunk rotation;Trunk flexed   Ambulation Surface Level;Indoor   Gait velocity 14 sec = 2.34 ft/sec     Standardized Balance Assessment   Standardized  Balance Assessment Timed Up and Go Test;Berg Balance Test     Berg Balance Test   Sit to Stand Able to stand without using hands and stabilize independently   Standing Unsupported Able to stand 2 minutes with supervision   Sitting with Back Unsupported but Feet Supported on Floor or Stool Able to sit safely and securely 2 minutes   Stand to Sit Controls descent by using hands   Transfers Able to transfer safely, definite need of hands   Standing Unsupported with Eyes Closed Able to stand 10 seconds with supervision   Standing Ubsupported with Feet Together Able to place feet together independently and stand for 1 minute with supervision   From Standing, Reach Forward with Outstretched Arm Can reach forward >12 cm safely (5")   From Standing Position, Pick up Object from Floor Able to pick up shoe, needs supervision   From Standing Position, Turn to Look Behind Over each Shoulder Turn sideways only but maintains balance   Turn 360 Degrees Needs close supervision or verbal cueing   Standing Unsupported, Alternately Place Feet on Step/Stool Able to stand independently and complete 8 steps >20 seconds   Standing Unsupported, One Foot in Front Able to take small step independently and hold 30 seconds   Standing on One Leg Tries to lift leg/unable to hold 3 seconds but remains standing independently   Total Score 38   Berg comment: Scores <45/56 indicates increased fall risk     Timed Up and Go Test   Normal TUG (seconds) 17.72   TUG Comments Scores >13.5 sec indicates increased fall risk.            Objective measurements completed on examination: See above findings.            Self Care:      PT Education - 09/02/17 1213    Education provided Yes   Education Details Discussed POC, objective results/fall risk/concerns regarding immobility at home; recommended patient walk with wife, 3 minutes, 3x/day for improved mobility   Person(s) Educated Patient;Spouse   Methods  Explanation;Demonstration;Handout   Comprehension Verbalized understanding             PT Long Term Goals - 09/02/17 1235      PT LONG TERM GOAL #1   Title Pt will perform HEP with wife's supervision for improved strength, balance and mobility.  TARGET11/16/18   Time 5   Period Weeks   Status New   Target Date 10/01/17     PT LONG TERM GOAL #2   Title Pt will improve TUG to less than or equal to 13.5 seconds for decreased fall risk.   Time 5   Period Weeks   Status New   Target Date 10/01/17  PT LONG TERM GOAL #3   Title Pt will improve 5x sit<>stand to less than or equal to 18 seconds for improved efficiency and safety with transfers.   Time 5   Period Weeks   Status New   Target Date 10/01/17     PT LONG TERM GOAL #4   Title Pt will improve gait velocity to at least 2.62 ft/sec for improved efficiency and safety with gait.   Time 5   Period Weeks   Status New   Target Date 10/01/17     PT LONG TERM GOAL #5   Title Pt/wife will verbalize understanding of fall prevention in home environment.   Time 5   Period Weeks   Status New   Target Date 10/01/17                Plan - Sep 16, 2017 1217    Clinical Impression Statement Pt is a 56 year old male with history of drug-induced secondary Parkinson's disease, referred by Dr. Krista Blue for gait instability.  Pt presents with abnormal posture, decreased balance, decreased functional strength, gait instability, deconditioning/decreased endurance.  Wife reports he mostly sits and watches TV all day.  She notes he has lost ability to assist with bathing, dressing, ADL tasks as well.  Pt would benefit from skilled PT to address the above stated deficits for improved functional mobility and decreased fall risk.   History and Personal Factors relevant to plan of care: PMH, HTN, hypothyroidism, bipolar disorder, per Dr. Krista Blue note white matter changes in brain; at fall risk per Merrilee Jansky and TUG score   Clinical Presentation Evolving    Clinical Presentation due to: fall risk, hx of secondary drug-induced Parkinson's disease   Clinical Decision Making Moderate   Rehab Potential Fair   Clinical Impairments Affecting Rehab Potential PMH, history of bipolar disorder, white matter brain changes (wife is present and appears supportive)   PT Frequency Other (comment)  1x/wk for 1 week, then 2x/wk for 4 weeks   PT Duration Other (comment)  total POC = 5 weeks   PT Treatment/Interventions ADLs/Self Care Home Management;Gait training;DME Instruction;Functional mobility training;Therapeutic activities;Therapeutic exercise;Balance training;Neuromuscular re-education;Patient/family education   PT Next Visit Plan Check about walking program for home and progress as able; initiate HEP for strength, balance   Recommended Other Services OT eval to address UE function, participaiton in ADLs   Consulted and Agree with Plan of Care Patient;Family member/caregiver   Family Member Consulted wife      Patient will benefit from skilled therapeutic intervention in order to improve the following deficits and impairments:  Abnormal gait, Decreased balance, Decreased mobility, Decreased strength, Difficulty walking, Postural dysfunction  Visit Diagnosis: Other abnormalities of gait and mobility  Unsteadiness on feet  Muscle weakness (generalized)  Abnormal posture      G-Codes - 16-Sep-2017 1243    Functional Assessment Tool Used (Outpatient Only) 5x sit<>stand:  25.43 sec, gait velocity 2.34 ft/sec, BERG 38/56, TUG 17.72 sec   Functional Limitation Mobility: Walking and moving around   Mobility: Walking and Moving Around Current Status [phone]) At least 60 percent but less than 80 percent impaired, limited or restricted   Mobility: Walking and Moving Around Goal Status [phone]) At least 40 percent but less than 60 percent impaired, limited or restricted       Problem List Patient Active Problem List   Diagnosis Date Noted  . Memory  loss 04/29/2017  . Bipolar I disorder ([place]) 07/30/2016  . Weakness 10/15/2015  . Parkinsonism (  [place]) 10/26/2014  . Abnormality of gait 07/25/2014  . High blood pressure   . Tremor     Jamaris Biernat W. 09/02/2017, 12:47 PM  Frazier Butt., PT   [facility] [address] [place], [place], [address] Phone: [phone]   Fax:  [phone]  Name: [name] MRN: [id number] Date of Birth: 1961/02/08

## 2017-09-08 ENCOUNTER — Ambulatory Visit: Payer: Medicare Other | Admitting: Physical Therapy

## 2017-09-08 DIAGNOSIS — R293 Abnormal posture: Secondary | ICD-10-CM

## 2017-09-08 DIAGNOSIS — R2689 Other abnormalities of gait and mobility: Secondary | ICD-10-CM | POA: Diagnosis not present

## 2017-09-08 DIAGNOSIS — M6281 Muscle weakness (generalized): Secondary | ICD-10-CM

## 2017-09-08 NOTE — Patient Instructions (Addendum)
HIP: Hamstrings - Short Sitting    Rest leg on raised surface. Keep knee straight. Sit tall and keep lift chest. Hold _15-30__ seconds. __3_ reps per set, __1-2_ sets per day  Copyright  VHI. All rights reserved.

## 2017-09-09 NOTE — Therapy (Signed)
[facility] [address] [place], [place], [address] Phone: [phone]   Fax:  [phone]  Physical Therapy Treatment  Patient Details  Name: [name] MRN: [id number] Date of Birth: 1960/12/06 Referring Provider: Krista Blue  Encounter Date: 09/08/2017      PT End of Session - 09/09/17 1422    Visit Number 2   Number of Visits 9   Date for PT Re-Evaluation 11/01/17   Authorization Type UHC Medicare-GCODE every 10th visit   PT Start Time 1016   PT Stop Time 1056   PT Time Calculation (min) 40 min   Activity Tolerance Patient tolerated treatment well   Behavior During Therapy WFL for tasks assessed/performed      Past Medical History:  Diagnosis Date  . High blood pressure   . Tremor     Past Surgical History:  Procedure Laterality Date  . HEMORRHOID SURGERY     1980's  . MULTIPLE TOOTH EXTRACTIONS     2016 Has top plate    There were no vitals filed for this visit.      Subjective Assessment - 09/08/17 1024    Subjective Per wife-patient has new rash on his RLE since the weekend, which is very painful.  Going to the doctor later today.  Wife reports he's up a little more than usual.   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   Currently in Pain? Yes   Pain Score 5    Pain Location Leg   Pain Orientation Right   Pain Descriptors / Indicators Burning   Pain Onset In the past 7 days   Aggravating Factors  pants touching it   Pain Relieving Factors nothing so far                         [facility] Adult PT Treatment/Exercise - 09/09/17 1428      Transfers   Transfers Sit to Stand;Stand to Sit   Sit to Stand 5: Supervision;With upper extremity assist;From bed   Stand to Sit 5:  Supervision;With upper extremity assist;To bed     Ambulation/Gait   Ambulation/Gait Yes   Ambulation/Gait Assistance 5: Supervision   Ambulation Distance (Feet) 600 Feet  x 2; 520 ft in 3 minutes   Assistive device None   Gait Pattern Step-through pattern;Decreased arm swing - right;Decreased arm swing - left;Decreased step length - right;Decreased step length - left;Decreased trunk rotation;Trunk flexed   Ambulation Surface Level;Indoor   Gait Comments Cues provided for relaxed arm swing.  Discussed walking program, for 3 minutes at a time, inside, 3 times per day to start     Exercises   Exercises Knee/Hip     Knee/Hip Exercises: Stretches   Active Hamstring Stretch Right;Left;2 reps  15 seconds   Active Hamstring Stretch Limitations seated with foot propped on stool-educated on gentle stretch, avoiding painful stretch (pt grimaces with pain, due to hamstring tightness bilateral lower extremities)-in stretch position, has has flexed knee and rounded back and feels stretch             Balance Exercises - 09/08/17 1041      OTAGO PROGRAM   Head Movements Sitting;5 reps   Neck Movements Sitting;5 reps   Back Extension Standing  chair  in front   Trunk Movements Standing;5 reps   Ankle Movements Sitting;10 reps   Knee Extensor 10 reps   Knee Flexor 10 reps   Overall OTAGO Comments Provided OTAGO folder to patient and wife, adding exercises as noted above, to be performed 3 times per week  Discussed walking and exercise parts of OTAGO, 3x/wk each, but for now trying to walk daily.           PT Education - 09/09/17 1422    Education provided Yes   Education Details Continue walking 3x/day for 3 minutes; OTAGO program and seated hamstring stretch provided for HEP   Person(s) Educated Patient;Spouse   Methods Explanation;Demonstration;Handout   Comprehension Verbalized understanding;Returned demonstration;Verbal cues required             PT Long Term Goals -  09/02/17 1235      PT LONG TERM GOAL #1   Title Pt will perform HEP with wife's supervision for improved strength, balance and mobility.  TARGET11/16/18   Time 5   Period Weeks   Status New   Target Date 10/01/17     PT LONG TERM GOAL #2   Title Pt will improve TUG to less than or equal to 13.5 seconds for decreased fall risk.   Time 5   Period Weeks   Status New   Target Date 10/01/17     PT LONG TERM GOAL #3   Title Pt will improve 5x sit<>stand to less than or equal to 18 seconds for improved efficiency and safety with transfers.   Time 5   Period Weeks   Status New   Target Date 10/01/17     PT LONG TERM GOAL #4   Title Pt will improve gait velocity to at least 2.62 ft/sec for improved efficiency and safety with gait.   Time 5   Period Weeks   Status New   Target Date 10/01/17     PT LONG TERM GOAL #5   Title Pt/wife will verbalize understanding of fall prevention in home environment.   Time 5   Period Weeks   Status New   Target Date 10/01/17               Plan - 09/09/17 1423    Clinical Impression Statement HEP initiated this visit, with OTAGO and seated hamstring stretch given for HEP.  Pt seems fatigued with exercises, but is able to walk two 3-minute bouts of walking during therapy session.  Pt demo significant tightness in bilateral hamstrings with seated hamstring stretch, grimacing due to pain, and PT explained to patient and wife how to safe, proper technique for stretching to avoid pain.  (Patient noted to have rash along R thigh area which is painful and burning x several days-pt is going to doctor after PT visit).   Rehab Potential Fair   Clinical Impairments Affecting Rehab Potential PMH, history of bipolar disorder, white matter brain changes (wife is present and appears supportive)   PT Frequency Other (comment)  1x/wk for 1 week, then 2x/wk for 4 weeks   PT Duration Other (comment)  total POC = 5 weeks   PT Treatment/Interventions ADLs/Self  Care Home Management;Gait training;DME Instruction;Functional mobility training;Therapeutic activities;Therapeutic exercise;Balance training;Neuromuscular re-education;Patient/family education   PT Next Visit Plan Review HEP, walking program; continue to add to OTAGO; other lower extremity/hamstring stretching/ant-post pelvic tilt as able added to HEP.   Consulted and Agree with Plan of Care Patient;Family member/caregiver   Family Member Consulted wife  Patient will benefit from skilled therapeutic intervention in order to improve the following deficits and impairments:  Abnormal gait, Decreased balance, Decreased mobility, Decreased strength, Difficulty walking, Postural dysfunction  Visit Diagnosis: Other abnormalities of gait and mobility  Muscle weakness (generalized)  Abnormal posture     Problem List Patient Active Problem List   Diagnosis Date Noted  . Memory loss 04/29/2017  . Bipolar I disorder ([place]) 07/30/2016  . Weakness 10/15/2015  . Parkinsonism ([place]) 10/26/2014  . Abnormality of gait 07/25/2014  . High blood pressure   . Tremor     Jimmy Onofrio W. 09/09/2017, 2:29 PM  Frazier Butt., PT   [facility] [address] [place], [place], [address] Phone: [phone]   Fax:  [phone]  Name: [name] MRN: [id number] Date of Birth: 11-24-1960

## 2017-09-10 ENCOUNTER — Ambulatory Visit: Payer: Medicare Other | Admitting: Physical Therapy

## 2017-09-13 ENCOUNTER — Ambulatory Visit: Payer: Medicare Other | Admitting: Physical Therapy

## 2017-09-16 ENCOUNTER — Ambulatory Visit: Payer: Medicare Other | Admitting: Physical Therapy

## 2017-09-20 ENCOUNTER — Encounter: Payer: Self-pay | Admitting: Physical Therapy

## 2017-09-20 ENCOUNTER — Ambulatory Visit: Payer: Medicare Other | Attending: Neurology | Admitting: Physical Therapy

## 2017-09-20 DIAGNOSIS — R41840 Attention and concentration deficit: Secondary | ICD-10-CM | POA: Insufficient documentation

## 2017-09-20 DIAGNOSIS — M6281 Muscle weakness (generalized): Secondary | ICD-10-CM | POA: Insufficient documentation

## 2017-09-20 DIAGNOSIS — R2681 Unsteadiness on feet: Secondary | ICD-10-CM | POA: Diagnosis present

## 2017-09-20 DIAGNOSIS — R293 Abnormal posture: Secondary | ICD-10-CM | POA: Diagnosis present

## 2017-09-20 DIAGNOSIS — R29898 Other symptoms and signs involving the musculoskeletal system: Secondary | ICD-10-CM | POA: Diagnosis present

## 2017-09-20 DIAGNOSIS — R278 Other lack of coordination: Secondary | ICD-10-CM | POA: Insufficient documentation

## 2017-09-20 DIAGNOSIS — R29818 Other symptoms and signs involving the nervous system: Secondary | ICD-10-CM | POA: Diagnosis present

## 2017-09-20 DIAGNOSIS — R2689 Other abnormalities of gait and mobility: Secondary | ICD-10-CM | POA: Insufficient documentation

## 2017-09-20 NOTE — Therapy (Signed)
[facility] [address] [place], [place], [address] Phone: [phone]   Fax:  [phone]  Physical Therapy Treatment  Patient Details  Name: [name] MRN: [id number] Date of Birth: 11-12-1961 Referring Provider: Krista Blue   Encounter Date: 09/20/2017  PT End of Session - 09/20/17 2158    Visit Number  3    Number of Visits  9    Date for PT Re-Evaluation  11/01/17    Authorization Type  UHC Medicare-GCODE every 10th visit    PT Start Time  1020    PT Stop Time  1100    PT Time Calculation (min)  40 min    Activity Tolerance  Patient tolerated treatment well needs several seated rest breaks   needs several seated rest breaks   Behavior During Therapy  WFL for tasks assessed/performed       Past Medical History:  Diagnosis Date  . High blood pressure   . Tremor     Past Surgical History:  Procedure Laterality Date  . HEMORRHOID SURGERY     1980's  . MULTIPLE TOOTH EXTRACTIONS     2016 Has top plate    There were no vitals filed for this visit.  Subjective Assessment - 09/20/17 1024    Subjective  Missed last week due to shingles-went to see medical doctor on 10/24 and they gave me medications.  Feeling better, no pain.  (Wife reports lesions on leg  are crusted over and MD gave clearance to return to therapy).    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    Currently in Pain?  No/denies    Pain Onset  In the past 7 days                      [facility] Adult PT Treatment/Exercise - 09/20/17 0001      Ambulation/Gait   Ambulation/Gait  Yes    Ambulation/Gait Assistance  5: Supervision    Ambulation Distance (Feet)  230 Feet    Assistive device  None    Gait Pattern  Step-through  pattern;Decreased arm swing - right;Decreased arm swing - left;Decreased step length - right;Decreased step length - left;Decreased trunk rotation;Trunk flexed    Ambulation Surface  Level    Gait Comments  Discussed/reviewed with patient/wife to perform walking program at home 3 minutes, 3x/day, 3 days per week (OTAGO on alternating 3 days)      Exercises   Exercises  Knee/Hip      Knee/Hip Exercises: Stretches   Active Hamstring Stretch  Right;Left;1 rep;30 seconds    Active Hamstring Stretch Limitations  Reviewed seated hamstring stretch-pt requires cues for posture-wife appears to understand positioning and cueing.          Balance Exercises - 09/20/17 1035      OTAGO PROGRAM   Head Movements  Sitting;5 reps    Neck Movements  Sitting;5 reps    Back Extension  Standing    Trunk Movements  Standing;5 reps    Ankle Movements  Sitting;10 reps Seated ankle pumps feet resting on floor      Knee Extensor  10 reps    Knee Flexor  10 reps    Hip ABductor  10  reps    Ankle Plantorflexors  20 reps, support    Ankle Dorsiflexors  -- 10 reps with UE support      Knee Bends  10 reps, support squats-cues for positioning      Backwards Walking  Support forward/back at counter      Walking and Turning Around  No assistive device x 2 reps      Sideways Walking  Assistive device counter for support, 3 reps      Overall OTAGO Comments  Reviewed HEP and updated/added as above through sidestepping.        PT Education - 09/20/17 2158    Education provided  Yes    Education Details  Review of HEP, additions to [employer] ) Educated  Patient;Spouse    Methods  Explanation;Demonstration;Handout    Comprehension  Verbalized understanding;Returned demonstration;Verbal cues required          PT Long Term Goals - 09/02/17 1235      PT LONG TERM GOAL #1   Title  Pt will perform HEP with wife's supervision for improved strength, balance and mobility.  TARGET11/16/18    Time  5     Period  Weeks    Status  New    Target Date  10/01/17      PT LONG TERM GOAL #2   Title  Pt will improve TUG to less than or equal to 13.5 seconds for decreased fall risk.    Time  5    Period  Weeks    Status  New    Target Date  10/01/17      PT LONG TERM GOAL #3   Title  Pt will improve 5x sit<>stand to less than or equal to 18 seconds for improved efficiency and safety with transfers.    Time  5    Period  Weeks    Status  New    Target Date  10/01/17      PT LONG TERM GOAL #4   Title  Pt will improve gait velocity to at least 2.62 ft/sec for improved efficiency and safety with gait.    Time  5    Period  Weeks    Status  New    Target Date  10/01/17      PT LONG TERM GOAL #5   Title  Pt/wife will verbalize understanding of fall prevention in home environment.    Time  5    Period  Weeks    Status  New    Target Date  10/01/17            Plan - 09/20/17 2159    Clinical Impression Statement  Pt has missed PT since 09/08/17 visit, due to having shingles, returns today, having completed medications and with clearance from medical doctor.  Reviewed OTAGO HEP given thus far, and added furhter OTAGO exercises, with emphasis on performing 3x/wk, with walking to be performed 3x/wk.  Pt will continue to benefit from skilled PT to address strength, balance and gait.    Rehab Potential  Fair    Clinical Impairments Affecting Rehab Potential  PMH, history of bipolar disorder, white matter brain changes (wife is present and appears supportive)    PT Frequency  Other (comment) 1x/wk for 1 week, then 2x/wk for 4 weeks      PT Duration  Other (comment) total POC = 5 weeks      PT Treatment/Interventions  ADLs/Self Care Home Management;Gait training;DME Instruction;Functional mobility  training;Therapeutic activities;Therapeutic exercise;Balance training;Neuromuscular re-education;Patient/family education    PT Next Visit Plan  [place] additions to OTAGO; continue to add to  OTAGO, other lower extremity/hamstring stretches, pelvic tilts; gait training    Consulted and Agree with Plan of Care  Patient;Family member/caregiver    Family Member Consulted  wife       Patient will benefit from skilled therapeutic intervention in order to improve the following deficits and impairments:  Abnormal gait, Decreased balance, Decreased mobility, Decreased strength, Difficulty walking, Postural dysfunction  Visit Diagnosis: Muscle weakness (generalized)  Unsteadiness on feet  Other abnormalities of gait and mobility     Problem List Patient Active Problem List   Diagnosis Date Noted  . Memory loss 04/29/2017  . Bipolar I disorder ([place]) 07/30/2016  . Weakness 10/15/2015  . Parkinsonism ([place]) 10/26/2014  . Abnormality of gait 07/25/2014  . High blood pressure   . Tremor     [name] W. 09/20/2017, 10:02 PM  Frazier Butt., PT   [facility] [address] [place], [place], [address] Phone: [phone]   Fax:  [phone]  Name: [name] MRN: [id number] Date of Birth: 03/24/1961

## 2017-09-20 NOTE — Patient Instructions (Signed)
Additions to [employer] (through sidestepping at counter)

## 2017-09-23 ENCOUNTER — Other Ambulatory Visit: Payer: Self-pay | Admitting: Neurology

## 2017-09-23 ENCOUNTER — Encounter: Payer: Self-pay | Admitting: Physical Therapy

## 2017-09-23 ENCOUNTER — Ambulatory Visit: Payer: Medicare Other | Admitting: Physical Therapy

## 2017-09-23 DIAGNOSIS — R278 Other lack of coordination: Secondary | ICD-10-CM

## 2017-09-23 DIAGNOSIS — M6281 Muscle weakness (generalized): Secondary | ICD-10-CM

## 2017-09-23 DIAGNOSIS — R2681 Unsteadiness on feet: Secondary | ICD-10-CM

## 2017-09-23 NOTE — Therapy (Signed)
[facility] [address] [place], [place], [address] Phone: [phone]   Fax:  [phone]  Physical Therapy Treatment  Patient Details  Name: [name] MRN: [id number] Date of Birth: 1961/07/10 Referring Provider: Krista Blue   Encounter Date: 09/23/2017  PT End of Session - 09/23/17 2153    Visit Number  4    Number of Visits  9    Date for PT Re-Evaluation  11/01/17    Authorization Type  UHC Medicare-GCODE every 10th visit    PT Start Time  1016    PT Stop Time  1100    PT Time Calculation (min)  44 min    Activity Tolerance  Patient tolerated treatment well needs several seated rest breaks    Behavior During Therapy  WFL for tasks assessed/performed       Past Medical History:  Diagnosis Date  . High blood pressure   . Tremor     Past Surgical History:  Procedure Laterality Date  . HEMORRHOID SURGERY     1980's  . MULTIPLE TOOTH EXTRACTIONS     2016 Has top plate    There were no vitals filed for this visit.  Subjective Assessment - 09/23/17 1021    Subjective  The last session was a workout.  Wife asks about working on arms to help with dressing.    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    Currently in Pain?  No/denies    Pain Onset  In the past 7 days                      [facility] Adult PT Treatment/Exercise - 09/23/17 0001      Exercises   Exercises  Other Exercises    Other Exercises   Widened BOS lateral weightshifting with added UE reach x 10 reps, then reaching across body x 10; seated PWR! Up  x 10 reps then seated PWR! Rock x 10 reps for coordinated UE activities.  Disccussed with pt/wife using big, effortful motions with UEs to assist with dressing and to perform dressing tasks  seated for safety.          Balance Exercises - 09/23/17 1022      OTAGO PROGRAM   Hip ABductor  10 reps    Ankle Plantorflexors  -- x 10 reps at counter    Ankle Dorsiflexors  -- x 10 reps at counter    Knee Bends  10 reps, support    Backwards Walking  Support    Walking and Turning Around  Assistive device    Sideways Walking  Assistive device    Tandem Stance  10 seconds, support    Tandem Walk  Support    One Leg Stand  10 seconds, support    Sit to Stand  5 reps, bilateral support then 5 reps 1 UE support, 1 rep no UE support    Overall OTAGO Comments  Added tandem stance below (see flow sheet above) to complete HEP       Highlighted exercises in OTAGO to be performed now, and discussed progression of some standing exercises with lessening UE support over time and practice.  PT Education - 09/23/17 2152    Education provided  Yes    Education Details  REcommendation of OT-pt/wife in agreement and PT requested through Dr. Elspeth Cho) Educated  Patient;Spouse    Methods  Explanation    Comprehension  Verbalized understanding      Additions to OTAGO-explanation, demo, handouts-verbalize and demo understanding (pt and wife)    PT Long Term Goals - 09/02/17 1235      PT LONG TERM GOAL #1   Title  Pt will perform HEP with wife's supervision for improved strength, balance and mobility.  TARGET11/16/18    Time  5    Period  Weeks    Status  New    Target Date  10/01/17      PT LONG TERM GOAL #2   Title  Pt will improve TUG to less than or equal to 13.5 seconds for decreased fall risk.    Time  5    Period  Weeks    Status  New    Target Date  10/01/17      PT LONG TERM GOAL #3   Title  Pt will improve 5x sit<>stand to less than or equal to 18 seconds for improved efficiency and safety with transfers.    Time  5    Period  Weeks    Status  New    Target Date  10/01/17      PT LONG TERM GOAL #4   Title  Pt will improve gait velocity to at least 2.62  ft/sec for improved efficiency and safety with gait.    Time  5    Period  Weeks    Status  New    Target Date  10/01/17      PT LONG TERM GOAL #5   Title  Pt/wife will verbalize understanding of fall prevention in home environment.    Time  5    Period  Weeks    Status  New    Target Date  10/01/17            Plan - 09/23/17 2154    Clinical Impression Statement  Completed additions to OTAGO HEP this visit, with pt partipating well with exercises.  Per wife's request/reminder about difficulty with pt lifting UEs overhead for dressing, PT requests OT order from Dr. Krista Blue.  Pt overall seems more alert and less fatigued in treatment sessions.  Pt will continue to benefit from skilled PT to address strength, balance and gait.    Rehab Potential  Fair    Clinical Impairments Affecting Rehab Potential  PMH, history of bipolar disorder, white matter brain changes (wife is present and appears supportive)    PT Frequency  Other (comment) 1x/wk for 1 week, then 2x/wk for 4 weeks    PT Duration  Other (comment) total POC = 5 weeks    PT Treatment/Interventions  ADLs/Self Care Home Management;Gait training;DME Instruction;Functional mobility training;Therapeutic activities;Therapeutic exercise;Balance training;Neuromuscular re-education;Patient/family education    PT Next Visit Plan  [place] additions to [place]; look at [facility]! Moves sitting/standing, other lower extremity/hamstring stretches, pelvic tilts; gait training    Consulted and Agree with Plan of Care  Patient;Family member/caregiver    Family Member Consulted  wife       Patient will benefit from skilled therapeutic intervention in order to improve the following deficits and impairments:  Abnormal gait, Decreased balance, Decreased mobility, Decreased strength, Difficulty walking, Postural dysfunction  Visit Diagnosis: Muscle weakness (generalized)  Unsteadiness on feet     Problem List Patient Active Problem  List   Diagnosis  Date Noted  . Memory loss 04/29/2017  . Bipolar I disorder ([place]) 07/30/2016  . Weakness 10/15/2015  . Parkinsonism ([place]) 10/26/2014  . Abnormality of gait 07/25/2014  . High blood pressure   . Tremor     Tyerra Loretto W. 09/23/2017, 10:01 PM  Frazier Butt., PT   [facility] [address] [place], [place], [address] Phone: [phone]   Fax:  [phone]  Name: Christain Niznik MRN: [id number] Date of Birth: [birth year]

## 2017-09-28 ENCOUNTER — Ambulatory Visit: Payer: Medicare Other | Admitting: Physical Therapy

## 2017-09-30 ENCOUNTER — Ambulatory Visit: Payer: Medicare Other | Admitting: Physical Therapy

## 2017-10-04 ENCOUNTER — Other Ambulatory Visit: Payer: Self-pay | Admitting: Neurology

## 2017-10-12 ENCOUNTER — Encounter: Payer: Self-pay | Admitting: Physical Therapy

## 2017-10-12 ENCOUNTER — Ambulatory Visit: Payer: Medicare Other | Admitting: Physical Therapy

## 2017-10-12 DIAGNOSIS — M6281 Muscle weakness (generalized): Secondary | ICD-10-CM | POA: Diagnosis not present

## 2017-10-12 DIAGNOSIS — R2681 Unsteadiness on feet: Secondary | ICD-10-CM

## 2017-10-12 DIAGNOSIS — R2689 Other abnormalities of gait and mobility: Secondary | ICD-10-CM

## 2017-10-12 DIAGNOSIS — R293 Abnormal posture: Secondary | ICD-10-CM

## 2017-10-12 NOTE — Therapy (Signed)
[facility] [address] [place], [place], [address] Phone: [phone]   Fax:  [phone]  Physical Therapy Treatment  Patient Details  Name: [name] MRN: [id number] Date of Birth: 04-15-1961 Referring Provider: Krista Blue   Encounter Date: 10/12/2017  PT End of Session - 10/12/17 1322    Visit Number  5    Number of Visits  9    Date for PT Re-Evaluation  11/01/17    Authorization Type  UHC Medicare-GCODE every 10th visit    PT Start Time  1146    PT Stop Time  1229    PT Time Calculation (min)  43 min    Activity Tolerance  Patient tolerated treatment well    Behavior During Therapy  WFL for tasks assessed/performed       Past Medical History:  Diagnosis Date  . High blood pressure   . Tremor     Past Surgical History:  Procedure Laterality Date  . HEMORRHOID SURGERY     1980's  . MULTIPLE TOOTH EXTRACTIONS     2016 Has top plate    There were no vitals filed for this visit.  Subjective Assessment - 10/12/17 1149    Subjective  Nothing new, other than got my flu and pneumonia shots last week.  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    Currently in Pain?  No/denies    Pain Onset  In the past 7 days                      [facility] Adult PT Treatment/Exercise - 10/12/17 1220      Ambulation/Gait   Ambulation/Gait  Yes    Ambulation/Gait Assistance  5: Supervision    Ambulation Distance (Feet)  825 Feet 4 minutes; 345 ft    Assistive device  None    Gait Pattern  Step-through pattern;Decreased arm swing - right;Decreased arm swing - left;Decreased step length - right;Decreased step length - left;Decreased  trunk rotation;Trunk flexed    Ambulation Surface  Level    Pre-Gait Activities  Standing with wide BOS, with lateral weightshifting x 10 reps    Gait Comments  Gait with head turns for environmental scanning.  Pt noted to slow gait with environmental tasks      Therapeutic Activites    Therapeutic Activities  Other Therapeutic Activities    Other Therapeutic Activities  Performed squats to pick up cones from floor, x 6 cues, with VCs for widened BOS and squat technique.  Performed 1/2 kneel to stand with UE support, x 5 reps as alternate way to pick up objects from low shelf; also discussed safety awareness with lifting objects from very high or low and need to ask for assistance or supervision from wife.        [facility]) - 10/12/17 1205    PWR! exercises  Moves in sitting    PWR! Up  x 10    PWR! Rock  x 5 reps each side    PWR! Twist  x 3 reps each side    PWR! Step  x 5 reps each side    Comments  PT visual and verbal cues for technique and intensity of movement     Therapist assist for scapular retraction and extension at elbows.  Balance Exercises - 10/12/17 1152      OTAGO PROGRAM   Tandem Stance  10 seconds, support    Tandem Walk  Support    One Leg Stand  10 seconds, support    Sit to Stand  10 reps, no support    Overall OTAGO Comments  Reviewed additions to HEP last visit-pt able to return demo with cues (which wife provides at home)        PT Education - 10/12/17 1322    Education provided  Yes    Education Details  Safety with squatting to pick up low objects from floor    Person(s) Educated  Patient;Spouse    Methods  Explanation;Demonstration    Comprehension  Returned demonstration;Verbalized understanding          PT Long Term Goals - 09/02/17 1235      PT LONG TERM GOAL #1   Title  Pt will perform HEP with wife's supervision for improved strength, balance and mobility.  TARGET11/16/18    Time  5    Period  Weeks    Status  New    Target Date   10/01/17      PT LONG TERM GOAL #2   Title  Pt will improve TUG to less than or equal to 13.5 seconds for decreased fall risk.    Time  5    Period  Weeks    Status  New    Target Date  10/01/17      PT LONG TERM GOAL #3   Title  Pt will improve 5x sit<>stand to less than or equal to 18 seconds for improved efficiency and safety with transfers.    Time  5    Period  Weeks    Status  New    Target Date  10/01/17      PT LONG TERM GOAL #4   Title  Pt will improve gait velocity to at least 2.62 ft/sec for improved efficiency and safety with gait.    Time  5    Period  Weeks    Status  New    Target Date  10/01/17      PT LONG TERM GOAL #5   Title  Pt/wife will verbalize understanding of fall prevention in home environment.    Time  5    Period  Weeks    Status  New    Target Date  10/01/17            Plan - 10/12/17 1323    Clinical Impression Statement  Pt has missed several weeks of therapy due to PT then patient out sick as well as Thanksgiving week; addressed functional activities today after reviewing HEP and introducing PWR! Moves exercises in sitting;  Will plan to check LTGs next visit-pt/wife seem okay to discharge with wife assisting with HEP and to start OT.  Pt could continue to benefit from skilled PT, as therapy has not been able to be consistent due to illness; however, wife is supportive with helping with HEP and PT recommendations at home.    Rehab Potential  Fair    Clinical Impairments Affecting Rehab Potential  PMH, history of bipolar disorder, white matter brain changes (wife is present and appears supportive)    PT Frequency  Other (comment) 1x/wk for 1 week, then 2x/wk for 4  weeks    PT Duration  Other (comment) total POC = 5 weeks    PT Treatment/Interventions  ADLs/Self Care Home Management;Gait training;DME Instruction;Functional mobility training;Therapeutic activities;Therapeutic exercise;Balance training;Neuromuscular re-education;Patient/family  education    PT Next Visit Plan  Check LTGs; plan for discharge (versus renew) next visit    Consulted and Agree with Plan of Care  Patient;Family member/caregiver    Family Member Consulted  wife       Patient will benefit from skilled therapeutic intervention in order to improve the following deficits and impairments:  Abnormal gait, Decreased balance, Decreased mobility, Decreased strength, Difficulty walking, Postural dysfunction  Visit Diagnosis: Unsteadiness on feet  Other abnormalities of gait and mobility  Muscle weakness (generalized)  Abnormal posture     Problem List Patient Active Problem List   Diagnosis Date Noted  . Memory loss 04/29/2017  . Bipolar I disorder ([place]) 07/30/2016  . Weakness 10/15/2015  . Parkinsonism ([place]) 10/26/2014  . Abnormality of gait 07/25/2014  . High blood pressure   . Tremor     Emelie Newsom W. 10/12/2017, 1:27 PM  Frazier Butt., PT   [facility] [address] [place], [place], [address] Phone: [phone]   Fax:  [phone]  Name: [name] MRN: [id number] Date of Birth: 04-11-1961

## 2017-10-14 ENCOUNTER — Ambulatory Visit: Payer: Medicare Other | Admitting: Occupational Therapy

## 2017-10-14 DIAGNOSIS — R2681 Unsteadiness on feet: Secondary | ICD-10-CM

## 2017-10-14 DIAGNOSIS — R41840 Attention and concentration deficit: Secondary | ICD-10-CM

## 2017-10-14 DIAGNOSIS — R278 Other lack of coordination: Secondary | ICD-10-CM

## 2017-10-14 DIAGNOSIS — R29818 Other symptoms and signs involving the nervous system: Secondary | ICD-10-CM

## 2017-10-14 DIAGNOSIS — M6281 Muscle weakness (generalized): Secondary | ICD-10-CM | POA: Diagnosis not present

## 2017-10-14 DIAGNOSIS — R29898 Other symptoms and signs involving the musculoskeletal system: Secondary | ICD-10-CM

## 2017-10-14 DIAGNOSIS — R293 Abnormal posture: Secondary | ICD-10-CM

## 2017-10-14 NOTE — Therapy (Signed)
[facility] [address] [place], [place], [address] Phone: [phone]   Fax:  [phone]  Occupational Therapy Evaluation  Patient Details  Name: [name] MRN: [id number] Date of Birth: 30-Oct-1961 Referring Provider: Dr. Krista Blue   Encounter Date: 10/14/2017  OT End of Session - 10/14/17 1306    Visit Number  1    Number of Visits  17    Date for OT Re-Evaluation  12/13/17    Authorization Type  UHC MCR - G code required    Authorization - Visit Number  1    Authorization - Number of Visits  10    OT Start Time  3295    OT Stop Time  1100    OT Time Calculation (min)  45 min    Activity Tolerance  Patient tolerated treatment well    Behavior During Therapy  Flat affect       Past Medical History:  Diagnosis Date  . High blood pressure   . Tremor     Past Surgical History:  Procedure Laterality Date  . HEMORRHOID SURGERY     1980's  . MULTIPLE TOOTH EXTRACTIONS     2016 Has top plate    There were no vitals filed for this visit.  Subjective Assessment - 10/14/17 1020    Patient is accompained by:  Family member wife    Pertinent History  Drug induced Parkinsonism approx. 5 years, bipolar dz, white matter changes    Patient Stated Goals  Get my hands and arms working better to wash hair, get dressed    Currently in Pain?  No/denies        [facility] OT Assessment - 10/14/17 0001      Assessment   Diagnosis  Drug induced Parkinsonism    Referring Provider  Dr. Krista Blue    Onset Date  -- approx. 5 years    Assessment  Pt with masked face, bradykinesia, mild rigidity, and cognitive deficits. Pt's wife also reports freezing with turns    Prior Therapy  Currently in outpatient P.T.      Precautions   Precautions  Fall      Balance Screen   Has the patient fallen in the past 6 months  Yes    How many times?  -- seeing P.T.       Home  Environment   Bathroom Shower/Tub  Tub/Shower unit;Curtain with  shower chair    Additional Comments  Pt lives in 1 story home with 2 steps to enter    Lives With  Spouse      Prior Function   Level of Independence  Needs assistance with ADLs;Needs assistance with gait    Level of Independence - Bath  Moderate    Dressing  Moderate    Grooming  Supervision/set-up    Vocation  On disability      ADL   Eating/Feeding  Needs assist with cutting food    Grooming  Supervision/safety    Upper Body Bathing  Moderate assistance    Lower Body Bathing  Moderate assistance    Upper Body Dressing  Moderate assistance    Lower Body Dressing  Moderate assistance    Toilet Transfer  Supervision/safety    Toileting - Clothing Manipulation  Minimal assistance    Toileting -  Hygiene  Supervision/safety    Tub/Shower Transfer  Minimal assistance    ADL comments  DME: Shower chair (but sometimes getting down into tub) with min assist.  Dependent for all IADLS d/t cognition and physical limitations. Pt requires cueing for most BADLS for safety and thoroughness      Mobility   Mobility Status Comments  Supervision required w/ walking      Written Expression   Dominant Hand  Right    Handwriting  90% legible with name only      Vision - History   Baseline Vision  Wears glasses all the time    Additional Comments  denies changes, except pt with decr. ability to blink.       Cognition   Overall Cognitive Status  History of cognitive impairments - at baseline Pt has had decline in cognition for the past 5 years      Observation/Other Assessments   Standing Functional Reach Test  Rt = 11.5", Lt = 10"    Other Surveys   Select    Physical Performance Test    Yes    Simulated Eating Time (seconds)  27.69 sec.  (partly d/t cognition/decr. processing).     Simulated Eating Comments  Holding utensil at very end    Donning Doffing Jacket Time (seconds)  93.26 sec with slick lined jacket    Donning Doffing Jacket Comments  3 button/unbutton (while shirt on) 40.69 sec.  with larger buttons only (unable to do smaller buttons)       Sensation   Additional Comments  tingling and numbness in the am in hands      Coordination   9 Hole Peg Test  Right;Left    Right 9 Hole Peg Test  58.62 sec    Left 9 Hole Peg Test  67.25 sec    Tremors  wife reports pt has tremors occasionally - worse with cold weather. None noted with writing or coordination tasks, only noted with donning/doffing jacket      Edema   Edema  none      Tone   Assessment Location  Left Upper Extremity at elbow with extension      ROM / Strength   AROM / PROM / Strength  AROM      AROM   Overall AROM Comments  BUE AROM WFL's except bilateral IR limited and slightly decreased full elbow extension especially Lt. Pt also has slightly limited neck rotation bilaterally      LUE Tone   LUE Tone  Hypertonic                        OT Short Term Goals - 10/14/17 1555      OT SHORT TERM GOAL #1   Title  Pt/family independent with modified PD HEP     Time  4    Period  Weeks    Status  New    Target Date  11/13/17      OT SHORT TERM GOAL #2   Title  Pt to perform dressing with min assist only    Baseline  mod assist    Time  4    Period  Weeks    Status  New      OT SHORT TERM GOAL #3   Title  Pt to perform bathing with min assist only    Baseline  mod assist    Time  4    Period  Weeks    Status  New      OT SHORT TERM GOAL #4   Title  Pt to improve coordination bilaterally as evidenced by reducing speed  on 9 hole peg test by 5 sec.     Baseline  Rt = 58.62 sec. Lt = 67.25 sec    Time  4    Period  Weeks    Status  New      OT SHORT TERM GOAL #5   Title  Pt/family to verbalize understanding with adaptive strategies to increase ease with ADLS    Time  4    Period  Weeks    Status  New        OT Long Term Goals - 10/14/17 1604      OT LONG TERM GOAL #1   Title  Pt/family to verbalize understanding with compensations for cognitive deficits and simple  ways to incorporate cognition into tasks    Time  8    Period  Weeks    Status  New    Target Date  12/13/17      OT LONG TERM GOAL #2   Title  Pt to perform BADLS w/ no more than mod cueing, supervision, and set up    Time  8    Period  Weeks    Status  New      OT LONG TERM GOAL #3   Title  Pt to improve ease with eating as evidenced by performing PPT #2 in 23 sec. or under    Baseline  27.69 sec    Time  8    Period  Weeks    Status  New      OT LONG TERM GOAL #4   Title  Pt to have greater ease donning/doffing jacket as evidenced by performing PPT #4 in 28 sec. or under    Baseline  33.12 sec    Time  8    Period  Weeks    Status  New            Plan - 10/14/17 1307    Clinical Impression Statement  Pt is a 56 y.o. male who presents to outpatient rehab s/p drug induced Parkinsonism for approx. 5 years, bipolar disorder, and white matter changes. Pt has had decline in ability to participate in ADLS and decreased functional use of BUE's. Pt would benefit from O.T. to address bradykinesia, coordination, cognition, functional balance, and increase participation in ADLS and functional UE use.     Occupational Profile and client history currently impacting functional performance  PMH: Drug induced Parkinsonism, bipolar, white matter changes    Occupational performance deficits (Please refer to evaluation for details):  ADL's;IADL's;Leisure    Rehab Potential  Fair    Current Impairments/barriers affecting progress:  severity of cognitive deficits, time since onset    OT Frequency  2x / week    OT Duration  8 weeks however may adjust - dependent on ability to make progress/participate    OT Treatment/Interventions  Self-care/ADL training;DME and/or AE instruction;Splinting;Patient/family education;Therapeutic exercises;Therapeutic activities;Neuromuscular education;Functional Mobility Training;Passive range of motion;Cognitive remediation/compensation;Manual  Therapy;Visual/perceptual remediation/compensation;Moist Heat    Plan  bag ex's specifically for ADL's, PWR! hands (only PWR! Up)    Clinical Decision Making  Several treatment options, min-mod task modification necessary    Consulted and Agree with Plan of Care  Patient;Family member/caregiver    Family Member Consulted  wife       Patient will benefit from skilled therapeutic intervention in order to improve the following deficits and impairments:  Decreased coordination, Decreased range of motion, Impaired flexibility, Decreased safety awareness, Impaired sensation, Improper spinal/pelvic alignment, Decreased activity tolerance,  Decreased knowledge of precautions, Impaired tone, Decreased balance, Decreased knowledge of use of DME, Impaired UE functional use, Decreased cognition, Decreased mobility, Impaired perceived functional ability  Visit Diagnosis: Other symptoms and signs involving the nervous system - Plan: Ot plan of care cert/re-cert  Other symptoms and signs involving the musculoskeletal system - Plan: Ot plan of care cert/re-cert  Unsteadiness on feet - Plan: Ot plan of care cert/re-cert  Abnormal posture - Plan: Ot plan of care cert/re-cert  Attention and concentration deficit - Plan: Ot plan of care cert/re-cert  Other lack of coordination - Plan: Ot plan of care cert/re-cert  G-Codes - 19/37/90 1609    Functional Assessment Tool Used (Outpatient only)  Mod assist for BADLS    Functional Limitation  Self care    Self Care Current Status [phone])  At least 40 percent but less than 60 percent impaired, limited or restricted    Self Care Goal Status ([id number])  At least 20 percent but less than 40 percent impaired, limited or restricted       Problem List Patient Active Problem List   Diagnosis Date Noted  . Memory loss 04/29/2017  . Bipolar I disorder ([place]) 07/30/2016  . Weakness 10/15/2015  . Parkinsonism ([place]) 10/26/2014  . Abnormality of gait 07/25/2014  . High  blood pressure   . Tremor     Carey Bullocks, OTR/L 10/14/2017, 4:11 PM  [facility] [address] [place], [place], [address] Phone: [phone]   Fax:  [phone]  Name: [name] MRN: [id number] Date of Birth: 15-Dec-1960

## 2017-10-15 ENCOUNTER — Ambulatory Visit: Payer: Medicare Other | Admitting: Physical Therapy

## 2017-10-15 ENCOUNTER — Encounter: Payer: Self-pay | Admitting: Physical Therapy

## 2017-10-15 DIAGNOSIS — R2681 Unsteadiness on feet: Secondary | ICD-10-CM

## 2017-10-15 DIAGNOSIS — R293 Abnormal posture: Secondary | ICD-10-CM

## 2017-10-15 DIAGNOSIS — M6281 Muscle weakness (generalized): Secondary | ICD-10-CM | POA: Diagnosis not present

## 2017-10-15 DIAGNOSIS — R2689 Other abnormalities of gait and mobility: Secondary | ICD-10-CM

## 2017-10-15 NOTE — Therapy (Signed)
[facility] [address] [place], [place], [address] Phone: [phone]   Fax:  [phone]  Physical Therapy Treatment  Patient Details  Name: [name] MRN: [id number] Date of Birth: Mar 20, 1961 Referring Provider: Krista Blue   Encounter Date: 10/15/2017  PT End of Session - 10/15/17 1956    Visit Number  6    Number of Visits  9    Date for PT Re-Evaluation  11/01/17    Authorization Type  U[employer] Medicare-GCODE every 10th visit    PT Start Time  0936    PT Stop Time  1027    PT Time Calculation (min)  51 min    Activity Tolerance  Patient tolerated treatment well    Behavior During Therapy  [facility] for tasks assessed/performed       Past Medical History:  Diagnosis Date  . High blood pressure   . Tremor     Past Surgical History:  Procedure Laterality Date  . HEMORRHOID SURGERY     1980's  . MULTIPLE TOOTH EXTRACTIONS     2016 Has top plate    There were no vitals filed for this visit.  Subjective Assessment - 10/15/17 0940    Subjective  Plans for today to be last day with PT; no changes since last visit.    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    Currently in Pain?  No/denies    Pain Onset  --         [facility] PT Assessment - 10/15/17 0942      Observation/Other Assessments   Focus on Therapeutic Outcomes (FOTO)   FOTO status score 50; ABCscale score 43.8 % (PT read questions for pt/wife to answer; provided comparison of pt's eval measures with FOTO; some extra time due to pt/wife disagreement in ABCscale scoring)                  [facility] Adult PT Treatment/Exercise - 10/15/17 0942      Transfers   Transfers  Sit to Stand;Stand to Sit    Sit to Stand  6: Modified independent (Device/Increase  time);Without upper extremity assist;From chair/3-in-1    Five time sit to stand comments   16.37    Stand to Sit  6: Modified independent (Device/Increase time);Without upper extremity assist;To chair/3-in-1      Ambulation/Gait   Ambulation/Gait  Yes    Ambulation/Gait Assistance  5: Supervision    Ambulation Distance (Feet)  500 Feet    Assistive device  None    Gait Pattern  Step-through pattern;Decreased arm swing - right;Decreased arm swing - left;Decreased step length - right;Decreased step length - left;Decreased trunk rotation;Trunk flexed    Ambulation Surface  Level;Indoor    Gait velocity  8.51 sec (3.85 ft/sec)      Standardized Balance Assessment   Standardized Balance Assessment  Timed Up and Go Test      Timed Up and Go Test   TUG  Normal TUG    Normal TUG (seconds)  13.72 13.06; 12.78 (avg = 13.19 sec)      Self-Care   Self-Care  Other Self-Care Comments    Other Self-Care Comments   Fall prevention education provided        [facility]) -  10/15/17 1002    PWR! exercises  Moves in sitting    PWR! Up  x 10    PWR! Rock   x5 reps each side    PWR! Twist  x 5 reps each side    PWR! Step  5 reps each side-single step out to side; 5 reps each side-double step out to each side    Comments  PT provides verbal and visual cues for technique and for intensity          PT Education - 10/15/17 1955    Education provided  Yes    Education Details  Fall prevention, PWR! Moves in sitting added to HEP (had also previously performed last visit and wife helping patient to perform at [employer])    Person(s) Educated  Patient;Spouse    Methods  Explanation;Demonstration;Handout    Comprehension  Verbalized understanding;Returned demonstration Wife verbalizes understanding           PT Long Term Goals - 10/15/17 0941      PT LONG TERM GOAL #1   Title  Pt will perform HEP with wife's supervision for improved strength, balance and mobility.  TARGET11/16/18     Time  5    Period  Weeks    Status  Achieved      PT LONG TERM GOAL #2   Title  Pt will improve TUG to less than or equal to 13.5 seconds for decreased fall risk.    Time  5    Period  Weeks    Status  Achieved      PT LONG TERM GOAL #3   Title  Pt will improve 5x sit<>stand to less than or equal to 18 seconds for improved efficiency and safety with transfers.    Time  5    Period  Weeks    Status  Achieved      PT LONG TERM GOAL #4   Title  Pt will improve gait velocity to at least 2.62 ft/sec for improved efficiency and safety with gait.    Time  5    Period  Weeks    Status  Achieved      PT LONG TERM GOAL #5   Title  Pt/wife will verbalize understanding of fall prevention in home environment.    Time  5    Period  Weeks    Status  Achieved            Plan - 10/15/17 1957    Clinical Impression Statement  Pt and wife feel good about physical therapy exercises and walking and desire to complete PT today.  Pt was evaluated by OT yesterday and OT plans to follow.  Pt has met all LTGs.  Wife appears willing and able to assist patient with exercises at home.  Pt is appropriate for discharge at this time.    Rehab Potential  Fair    Clinical Impairments Affecting Rehab Potential  PMH, history of bipolar disorder, white matter brain changes (wife is present and appears supportive)    PT Frequency  Other (comment) 1x/wk for 1 week, then 2x/wk for 4 weeks    PT Duration  Other (comment) total POC = 5 weeks    PT Treatment/Interventions  ADLs/Self Care Home Management;Gait training;DME Instruction;Functional mobility training;Therapeutic activities;Therapeutic exercise;Balance training;Neuromuscular re-education;Patient/family education    PT Next Visit Plan  Discharge this visit    Consulted and Agree with Plan of Care  Patient;Family member/caregiver    Family Member Consulted  wife       Patient will benefit from skilled therapeutic intervention in order to improve the  following deficits and impairments:  Abnormal gait, Decreased balance, Decreased mobility, Decreased strength, Difficulty walking, Postural dysfunction  Visit Diagnosis: Unsteadiness on feet  Abnormal posture  Other abnormalities of gait and mobility   G-Codes - 2017/10/27 1959    Functional Assessment Tool Used (Outpatient Only)  5x sit<>stand:  16.37 sec, gait velocity 3.85 ft/sec, TUG 13.19 sec    Functional Limitation  Mobility: Walking and moving around    Mobility: Walking and Moving Around Goal Status [phone])  At least 40 percent but less than 60 percent impaired, limited or restricted    Mobility: Walking and Moving Around Discharge Status [phone])  At least 20 percent but less than 40 percent impaired, limited or restricted       Problem List Patient Active Problem List   Diagnosis Date Noted  . Memory loss 04/29/2017  . Bipolar I disorder ([place]) 07/30/2016  . Weakness 10/15/2015  . Parkinsonism ([place]) 10/26/2014  . Abnormality of gait 07/25/2014  . High blood pressure   . Tremor     Jora Galluzzo W. October 27, 2017, 8:01 PM  Frazier Butt., PT   [facility] [address] [place], [place], [address] Phone: [phone]   Fax:  [phone]  Name: Barbara Keng MRN: [id number] Date of Birth: 1961-05-21   PHYSICAL THERAPY DISCHARGE SUMMARY  Visits from Start of Care: 6  Current functional level related to goals / functional outcomes: PT Long Term Goals - 2017-10-27 0941      PT LONG TERM GOAL #1   Title  Pt will perform HEP with wife's supervision for improved strength, balance and mobility.  TARGET11/16/18    Time  5    Period  Weeks    Status  Achieved      PT LONG TERM GOAL #2   Title  Pt will improve TUG to less than or equal to 13.5 seconds for decreased fall risk.    Time  5    Period  Weeks    Status  Achieved      PT LONG TERM GOAL #3   Title  Pt will improve 5x sit<>stand to less than or equal  to 18 seconds for improved efficiency and safety with transfers.    Time  5    Period  Weeks    Status  Achieved      PT LONG TERM GOAL #4   Title  Pt will improve gait velocity to at least 2.62 ft/sec for improved efficiency and safety with gait.    Time  5    Period  Weeks    Status  Achieved      PT LONG TERM GOAL #5   Title  Pt/wife will verbalize understanding of fall prevention in home environment.    Time  5    Period  Weeks    Status  Achieved      Pt has met all LTGs-improved gait velocity, transfers,and TUG scores     Remaining deficits: Posture, balance, endurance; decreased safety awareness   Education / Equipment: Educated patient and wife in HEP (OTAGO, stretches, and PWR! Moves in sitting) as well as fall prevention and walking program-wife verbalizes understanding and assists patient with HEP    Plan: Patient agrees to discharge.  Patient goals were met. Patient is being discharged due to meeting the stated rehab goals.  ?????  Mady Haagensen, PT  10/15/17 8:03 PM Phone: [phone] Fax: [phone]

## 2017-10-18 ENCOUNTER — Ambulatory Visit: Payer: Medicare Other | Attending: Neurology | Admitting: Occupational Therapy

## 2017-10-18 DIAGNOSIS — R41840 Attention and concentration deficit: Secondary | ICD-10-CM | POA: Insufficient documentation

## 2017-10-18 DIAGNOSIS — R293 Abnormal posture: Secondary | ICD-10-CM | POA: Diagnosis present

## 2017-10-18 DIAGNOSIS — R29818 Other symptoms and signs involving the nervous system: Secondary | ICD-10-CM | POA: Diagnosis present

## 2017-10-18 DIAGNOSIS — R29898 Other symptoms and signs involving the musculoskeletal system: Secondary | ICD-10-CM | POA: Diagnosis present

## 2017-10-18 DIAGNOSIS — R278 Other lack of coordination: Secondary | ICD-10-CM | POA: Insufficient documentation

## 2017-10-18 DIAGNOSIS — R2681 Unsteadiness on feet: Secondary | ICD-10-CM | POA: Insufficient documentation

## 2017-10-18 NOTE — Therapy (Signed)
[facility] [address] [place], [place], [address] Phone: [phone]   Fax:  [phone]  Occupational Therapy Treatment  Patient Details  Name: [name] MRN: [id number] Date of Birth: [birth year] Referring Provider: Dr. Krista Blue   Encounter Date: 10/18/2017  OT End of Session - 10/18/17 0855    Visit Number  2    Number of Visits  17    Date for OT Re-Evaluation  12/13/17    Authorization Type  UHC MCR - G code required    Authorization - Visit Number  2    Authorization - Number of Visits  10    OT Start Time  0800    OT Stop Time  0845    OT Time Calculation (min)  45 min    Activity Tolerance  Patient tolerated treatment well       Past Medical History:  Diagnosis Date  . High blood pressure   . Tremor     Past Surgical History:  Procedure Laterality Date  . HEMORRHOID SURGERY     1980's  . MULTIPLE TOOTH EXTRACTIONS     2016 Has top plate    There were no vitals filed for this visit.  Subjective Assessment - 10/18/17 0804    Patient is accompained by:  Family member    Pertinent History  Drug induced Parkinsonism approx. 5 years, bipolar dz, white matter changes    Patient Stated Goals  Get my hands and arms working better to wash hair, get dressed    Currently in Pain?  No/denies                   OT Treatments/Exercises (OP) - 10/18/17 0001      ADLs   UB Dressing  Practiced donning/doffing jacket x 3 with strategies for large amplitude movement with mod cueing      Exercises   Exercises  -- UBE x 5 min. Level 3 keeping RPM > 40      Neurological Re-education Exercises   Other Exercises 1  Pt issued bag exercises for neuro re-educ. and large amplitude movements to increased ease with UB/LB dressing/bathing    Other Exercises 2  PWR! Up in seated x 10 reps with cues for posture, to fully extend elbows, and open fingers fully. Standing: BUE IR with dowel (sliding up back) x 10               OT Education - 10/18/17 0854    Education provided  Yes    Education Details  Bag exercises, PWR! Hands (Up only)     Person(s) Educated  Patient;Spouse    Methods  Explanation;Demonstration;Verbal cues;Handout    Comprehension  Verbalized understanding;Returned demonstration;Verbal cues required;Need further instruction       OT Short Term Goals - 10/18/17 0855      OT SHORT TERM GOAL #1   Title  Pt/family independent with modified PD HEP     Time  4    Period  Weeks    Status  On-going      OT SHORT TERM GOAL #2   Title  Pt to perform dressing with min assist only    Baseline  mod assist    Time  4    Period  Weeks    Status  New      OT SHORT TERM GOAL #3   Title  Pt to perform bathing with min assist only    Baseline  mod  assist    Time  4    Period  Weeks    Status  New      OT SHORT TERM GOAL #4   Title  Pt to improve coordination bilaterally as evidenced by reducing speed on 9 hole peg test by 5 sec.     Baseline  Rt = 58.62 sec. Lt = 67.25 sec    Time  4    Period  Weeks    Status  New      OT SHORT TERM GOAL #5   Title  Pt/family to verbalize understanding with adaptive strategies to increase ease with ADLS    Time  4    Period  Weeks    Status  New        OT Long Term Goals - 10/14/17 1604      OT LONG TERM GOAL #1   Title  Pt/family to verbalize understanding with compensations for cognitive deficits and simple ways to incorporate cognition into tasks    Time  8    Period  Weeks    Status  New    Target Date  12/13/17      OT LONG TERM GOAL #2   Title  Pt to perform BADLS w/ no more than mod cueing, supervision, and set up    Time  8    Period  Weeks    Status  New      OT LONG TERM GOAL #3   Title  Pt to improve ease with eating as evidenced by performing PPT #2 in 23 sec. or under    Baseline  27.69 sec    Time  8    Period  Weeks    Status  New      OT LONG TERM GOAL #4   Title  Pt to have greater ease  donning/doffing jacket as evidenced by performing PPT #4 in 28 sec. or under    Baseline  33.12 sec    Time  8    Period  Weeks    Status  New            Plan - 10/18/17 0855    Clinical Impression Statement  Pt responding well to large amplitude movement with review/reinforcement required d/t cognitive deficits    Rehab Potential  Fair    OT Frequency  2x / week    OT Duration  8 weeks    OT Treatment/Interventions  Self-care/ADL training;DME and/or AE instruction;Splinting;Therapeutic activities;Therapeutic exercise;Functional Mobility Training;Neuromuscular education;Passive range of motion;Patient/family education;Manual Therapy;Visual/perceptual remediation/compensation;Cognitive remediation/compensation    Plan  review bag ex's and PWR! Hands, strategies for ADLS, practice dressing    Consulted and Agree with Plan of Care  Patient;Family member/caregiver    Family Member Consulted  wife       Patient will benefit from skilled therapeutic intervention in order to improve the following deficits and impairments:  Decreased coordination, Decreased range of motion, Impaired flexibility, Decreased safety awareness, Impaired sensation, Improper spinal/pelvic alignment, Decreased activity tolerance, Decreased knowledge of precautions, Impaired tone, Decreased balance, Decreased knowledge of use of DME, Impaired UE functional use, Decreased cognition, Decreased mobility, Impaired perceived functional ability  Visit Diagnosis: Abnormal posture  Other symptoms and signs involving the nervous system  Other symptoms and signs involving the musculoskeletal system    Problem List Patient Active Problem List   Diagnosis Date Noted  . Memory loss 04/29/2017  . Bipolar I disorder ([place]) 07/30/2016  . Weakness 10/15/2015  . Parkinsonism (  [place]) 10/26/2014  . Abnormality of gait 07/25/2014  . High blood pressure   . Tremor     Carey Bullocks, OTR/L 10/18/2017, 8:57 AM  [facility] [address] [place], [place], [address] Phone: [phone]   Fax:  [phone]  Name: Marcel Gary MRN: [id number] Date of Birth: May 12, 1961

## 2017-10-18 NOTE — Patient Instructions (Addendum)
Bag Exercises:  Small trash bag or produce bag works best.  For all exercises, sit with big posture (sit up tall with head up) and use big movements. Perform the following exercises 2 times per day.   Hold bag in one hand. Stretch both arms/hands out to the side as big as you can. Then, pass bag from one hand to the other BEHIND you. Stretch arms back out big after each pass. Repeat 5 times.  Hold bag in both hands in front of you with hands/arms shoulder length apart. Move bag behind your head. Repeat 5 times.  Hold bag in both hands in front of you with hands/arms shoulder length apart. Lift leg and move bag completely under each foot and back. Repeat 5 times on each side.  Hold bag in one hand. Toss bag up and catch with the same/opposite hand. Repeat 5 times with each hand.   PWR! Hands  Then, start with elbows bent and hands closed.  PWR! Hands: Push hands out BIG. Elbows straight, wrists up, fingers open and spread apart BIG. (Can also perform by pushing down on table, chair, knees. Push above head, out to the side, behind you, in front of you.)  Perform at least 10 repetitions 1x/day, but perform PWR! hands throughout the day when you are having trouble using your hands (picking up/manipulating small objects, writing, eating, typing, sewing, buttoning, etc.).

## 2017-10-20 ENCOUNTER — Ambulatory Visit: Payer: Medicare Other | Admitting: Occupational Therapy

## 2017-10-20 DIAGNOSIS — R29898 Other symptoms and signs involving the musculoskeletal system: Secondary | ICD-10-CM

## 2017-10-20 DIAGNOSIS — R2681 Unsteadiness on feet: Secondary | ICD-10-CM

## 2017-10-20 DIAGNOSIS — R293 Abnormal posture: Secondary | ICD-10-CM

## 2017-10-20 DIAGNOSIS — R41840 Attention and concentration deficit: Secondary | ICD-10-CM

## 2017-10-20 DIAGNOSIS — R29818 Other symptoms and signs involving the nervous system: Secondary | ICD-10-CM

## 2017-10-20 DIAGNOSIS — R278 Other lack of coordination: Secondary | ICD-10-CM

## 2017-10-20 NOTE — Therapy (Signed)
[facility] [address] [place], [place], [address] Phone: [phone]   Fax:  [phone]  Occupational Therapy Treatment  Patient Details  Name: [name] MRN: [id number] Date of Birth: 07-10-1961 Referring Provider: Dr. Krista Blue   Encounter Date: 10/20/2017  OT End of Session - 10/20/17 1050    Visit Number  3    Number of Visits  17    Date for OT Re-Evaluation  12/13/17    Authorization Type  UHC MCR - G code required    Authorization - Visit Number  3    Authorization - Number of Visits  10    OT Start Time  0845    OT Stop Time  0930    OT Time Calculation (min)  45 min       Past Medical History:  Diagnosis Date  . High blood pressure   . Tremor     Past Surgical History:  Procedure Laterality Date  . HEMORRHOID SURGERY     1980's  . MULTIPLE TOOTH EXTRACTIONS     2016 Has top plate    There were no vitals filed for this visit.  Subjective Assessment - 10/20/17 0849    Subjective   Per wife, "We've been practicing"    Patient is accompained by:  Family member    Pertinent History  Drug induced Parkinsonism approx. 5 years, bipolar dz, white matter changes    Patient Stated Goals  Get my hands and arms working better to wash hair, get dressed    Currently in Pain?  No/denies                   OT Treatments/Exercises (OP) - 10/20/17 0001      ADLs   UB Dressing  Practiced UB dressing using large amplitude movements for donning/doffing jacket and pull over shirt. Pt required cues for sequencing correctly and opening hands large before pulling sleeve of shirt up or off, or pulling pants/socks on. Pt overall only needed cueing and occasional min assist to pull down shirt in back or straighten (secondary to 2 layers)    LB Dressing  Practiced donning/doffing pants and shoes and socks. Pt able to physically do w/o assist but cues to sit to pull pants on over feet for safety (pt tries to  stand to don pants over feet). Pt also needed cues for sequencing and using large amplitude movement strategies. Pt only needed assist for fully donning socks over thermal underwear (d/t 2 layers)    ADL Comments  Educated pt's wife in how to cue pt effectively, and to avoid over cueing/too much verbal cueing, keep cues simple and direct, and allow pt to process before speaking again. Also recommended consistent cueing and using consistent dressing strategies the same way each time for best carryover and habitual learning.       Neurological Re-education Exercises   Other Exercises 1  Review bag exercises and PWR! Hands ex's for large amplitude movement strategies to incr. ease with ADLS. Pt still requires min to mod simple cueing d/t cognition deficits, however wife present for all education and will help re-inforce and carryover at home.     Other Exercises 2  Sit to stand x 5 using PWR! Up strategies for large amplitude movement with focus on posture               OT Short Term Goals - 10/20/17 1051      OT SHORT TERM GOAL #  1   Title  Pt/family independent with modified PD HEP     Time  4    Period  Weeks    Status  On-going      OT SHORT TERM GOAL #2   Title  Pt to perform dressing with min assist only    Baseline  mod assist    Time  4    Period  Weeks    Status  Achieved      OT SHORT TERM GOAL #3   Title  Pt to perform bathing with min assist only    Baseline  mod assist    Time  4    Period  Weeks    Status  New      OT SHORT TERM GOAL #4   Title  Pt to improve coordination bilaterally as evidenced by reducing speed on 9 hole peg test by 5 sec.     Baseline  Rt = 58.62 sec. Lt = 67.25 sec    Time  4    Period  Weeks    Status  New      OT SHORT TERM GOAL #5   Title  Pt/family to verbalize understanding with adaptive strategies to increase ease with ADLS    Time  4    Period  Weeks    Status  New        OT Long Term Goals - 10/14/17 1604      OT LONG  TERM GOAL #1   Title  Pt/family to verbalize understanding with compensations for cognitive deficits and simple ways to incorporate cognition into tasks    Time  8    Period  Weeks    Status  New    Target Date  12/13/17      OT LONG TERM GOAL #2   Title  Pt to perform BADLS w/ no more than mod cueing, supervision, and set up    Time  8    Period  Weeks    Status  New      OT LONG TERM GOAL #3   Title  Pt to improve ease with eating as evidenced by performing PPT #2 in 23 sec. or under    Baseline  27.69 sec    Time  8    Period  Weeks    Status  New      OT LONG TERM GOAL #4   Title  Pt to have greater ease donning/doffing jacket as evidenced by performing PPT #4 in 28 sec. or under    Baseline  33.12 sec    Time  8    Period  Weeks    Status  New            Plan - 10/20/17 1051    Clinical Impression Statement  Pt met STG #2 with cueing.     Occupational Profile and client history currently impacting functional performance  PMH: Drug induced Parkinsonism, bipolar, white matter changes    Rehab Potential  Fair    Current Impairments/barriers affecting progress:  severity of cognitive deficits, time since onset    OT Frequency  2x / week    OT Duration  8 weeks    OT Treatment/Interventions  Self-care/ADL training;DME and/or AE instruction;Splinting;Therapeutic activities;Therapeutic exercise;Functional Mobility Training;Neuromuscular education;Passive range of motion;Patient/family education;Manual Therapy;Visual/perceptual remediation/compensation;Cognitive remediation/compensation    Plan  adaptive strategies for ADLS including handout and practicing cutting food, opening jars/containers. Discuss potential A/E and strategies for safety with bathing  Consulted and Agree with Plan of Care  Patient;Family member/caregiver    Family Member Consulted  wife       Patient will benefit from skilled therapeutic intervention in order to improve the following deficits and  impairments:  Decreased coordination, Decreased range of motion, Impaired flexibility, Decreased safety awareness, Impaired sensation, Improper spinal/pelvic alignment, Decreased activity tolerance, Decreased knowledge of precautions, Impaired tone, Decreased balance, Decreased knowledge of use of DME, Impaired UE functional use, Decreased cognition, Decreased mobility, Impaired perceived functional ability  Visit Diagnosis: Abnormal posture  Other symptoms and signs involving the nervous system  Other symptoms and signs involving the musculoskeletal system  Unsteadiness on feet  Attention and concentration deficit  Other lack of coordination    Problem List Patient Active Problem List   Diagnosis Date Noted  . Memory loss 04/29/2017  . Bipolar I disorder ([place]) 07/30/2016  . Weakness 10/15/2015  . Parkinsonism ([place]) 10/26/2014  . Abnormality of gait 07/25/2014  . High blood pressure   . Tremor     Carey Bullocks, OTR/L 10/20/2017, 10:54 AM  [facility] [address] [place], [place], [address] Phone: [phone]   Fax:  [phone]  Name: Ramzi Brathwaite MRN: [id number] Date of Birth: 1960-11-26

## 2017-10-26 ENCOUNTER — Encounter: Payer: Medicare Other | Admitting: Occupational Therapy

## 2017-10-28 ENCOUNTER — Encounter: Payer: Medicare Other | Admitting: Occupational Therapy

## 2017-11-01 ENCOUNTER — Ambulatory Visit: Payer: Medicare Other | Admitting: Occupational Therapy

## 2017-11-01 DIAGNOSIS — R278 Other lack of coordination: Secondary | ICD-10-CM

## 2017-11-01 DIAGNOSIS — R2681 Unsteadiness on feet: Secondary | ICD-10-CM

## 2017-11-01 DIAGNOSIS — R29818 Other symptoms and signs involving the nervous system: Secondary | ICD-10-CM

## 2017-11-01 DIAGNOSIS — R293 Abnormal posture: Secondary | ICD-10-CM | POA: Diagnosis not present

## 2017-11-01 DIAGNOSIS — R29898 Other symptoms and signs involving the musculoskeletal system: Secondary | ICD-10-CM

## 2017-11-01 NOTE — Therapy (Signed)
[facility] [address] [place], [place], [address] Phone: [phone]   Fax:  [phone]  Occupational Therapy Treatment  Patient Details  Name: [name] MRN: [id number] Date of Birth: 1960-11-21 Referring Provider (Historical): Dr. Krista Blue   Encounter Date: 11/01/2017  OT End of Session - 11/01/17 1217    Visit Number  4    Number of Visits  17    Date for OT Re-Evaluation  12/13/17    Authorization Type  UHC MCR - G code required    Authorization - Visit Number  4    Authorization - Number of Visits  10    OT Start Time  1100    OT Stop Time  1145    OT Time Calculation (min)  45 min    Activity Tolerance  Patient tolerated treatment well       Past Medical History:  Diagnosis Date  . High blood pressure   . Tremor     Past Surgical History:  Procedure Laterality Date  . HEMORRHOID SURGERY     1980's  . MULTIPLE TOOTH EXTRACTIONS     2016 Has top plate    There were no vitals filed for this visit.  Subjective Assessment - 11/01/17 1103    Patient is accompained by:  Family member wife    Pertinent History  Drug induced Parkinsonism approx. 5 years, bipolar dz, white matter changes    Patient Stated Goals  Get my hands and arms working better to wash hair, get dressed    Currently in Pain?  No/denies                   OT Treatments/Exercises (OP) - 11/01/17 0001      ADLs   ADL Comments  Discussed large movement strategies with ADLS and issued handout. Verbally reviewed all, then demo cutting food and proper hand placement on utensil, opening jars/containers, proper hand placement on containers, and donning/doffing jacket. Therapist then had pt demo with cueing to maintain large ampitude movements. Discussed bathing and A/E needs and strategies for safety (recommended LH sponge for back, seated for all bathing, and crossing foot over leg for washing feet). Made suggestions for keeping  pt moving and exercising t/o day, and avoiding sedentary lifestyle including: walking in the house during commercials, helping fold clothes, put away groceries, and recumbent bike daily.              OT Education - 11/01/17 1106    Education provided  Yes    Education Details  movement strategies for [employer]) Educated  Patient;Spouse    Methods  Explanation;Demonstration;Handout;Verbal cues    Comprehension  Verbalized understanding;Verbal cues required;Need further instruction       OT Short Term Goals - 11/01/17 1218      OT SHORT TERM GOAL #1   Title  Pt/family independent with modified PD HEP     Time  4    Period  Weeks    Status  On-going      OT SHORT TERM GOAL #2   Title  Pt to perform dressing with min assist only    Baseline  mod assist    Time  4    Period  Weeks    Status  Achieved      OT SHORT TERM GOAL #3   Title  Pt to perform bathing with min assist only    Baseline  mod assist  Time  4    Period  Weeks    Status  Achieved      OT SHORT TERM GOAL #4   Title  Pt to improve coordination bilaterally as evidenced by reducing speed on 9 hole peg test by 5 sec.     Baseline  Rt = 58.62 sec. Lt = 67.25 sec    Time  4    Period  Weeks    Status  On-going      OT SHORT TERM GOAL #5   Title  Pt/family to verbalize understanding with adaptive strategies to increase ease with ADLS    Time  4    Period  Weeks    Status  On-going        OT Long Term Goals - 10/14/17 1604      OT LONG TERM GOAL #1   Title  Pt/family to verbalize understanding with compensations for cognitive deficits and simple ways to incorporate cognition into tasks    Time  8    Period  Weeks    Status  New    Target Date  12/13/17      OT LONG TERM GOAL #2   Title  Pt to perform BADLS w/ no more than mod cueing, supervision, and set up    Time  8    Period  Weeks    Status  New      OT LONG TERM GOAL #3   Title  Pt to improve ease with eating as evidenced by  performing PPT #2 in 23 sec. or under    Baseline  27.69 sec    Time  8    Period  Weeks    Status  New      OT LONG TERM GOAL #4   Title  Pt to have greater ease donning/doffing jacket as evidenced by performing PPT #4 in 28 sec. or under    Baseline  33.12 sec    Time  8    Period  Weeks    Status  New            Plan - 11/01/17 1218    Clinical Impression Statement  Pt has met 2 STG's and progressing towards remaining STG's. Pt with increased independence with BADLS however still requires cueing d/t cognitive deficits    Occupational Profile and client history currently impacting functional performance  PMH: Drug induced Parkinsonism, bipolar, white matter changes    Rehab Potential  Fair    Current Impairments/barriers affecting progress:  severity of cognitive deficits, time since onset    OT Frequency  2x / week    OT Duration  8 weeks    OT Treatment/Interventions  Self-care/ADL training;DME and/or AE instruction;Splinting;Therapeutic activities;Therapeutic exercise;Functional Mobility Training;Neuromuscular education;Passive range of motion;Patient/family education;Manual Therapy;Visual/perceptual remediation/compensation;Cognitive remediation/compensation    Plan  simplified coordination HEP, Dynamic standing with coordination activity    Consulted and Agree with Plan of Care  Patient;Family member/caregiver    Family Member Consulted  wife       Patient will benefit from skilled therapeutic intervention in order to improve the following deficits and impairments:  Decreased coordination, Decreased range of motion, Impaired flexibility, Decreased safety awareness, Impaired sensation, Improper spinal/pelvic alignment, Decreased activity tolerance, Decreased knowledge of precautions, Impaired tone, Decreased balance, Decreased knowledge of use of DME, Impaired UE functional use, Decreased cognition, Decreased mobility, Impaired perceived functional ability  Visit  Diagnosis: Abnormal posture  Other symptoms and signs involving the nervous system  Other  symptoms and signs involving the musculoskeletal system  Unsteadiness on feet  Other lack of coordination    Problem List Patient Active Problem List   Diagnosis Date Noted  . Memory loss 04/29/2017  . Bipolar I disorder ([place]) 07/30/2016  . Weakness 10/15/2015  . Parkinsonism ([place]) 10/26/2014  . Abnormality of gait 07/25/2014  . High blood pressure   . Tremor     Carey Bullocks, OTR/L 11/01/2017, 12:20 PM  [facility] [address] [place], [place], [address] Phone: [phone]   Fax:  [phone]  Name: [name] MRN: [id number] Date of Birth: [birth year]

## 2017-11-01 NOTE — Patient Instructions (Signed)
Performing Daily Activities with Big Movements  Pick at least one activity a day and perform with BIG, DELIBERATE movements/effort. This can make the activity easier and turn daily activities into exercise!  If you are standing during the activity, make sure to keep feet apart and stand with good/big/PWR! UP posture.  Examples:  Dressing - Push arms in sleeves, twist when putting on jacket, push foot into pants, open hands to pull down shirt/put on socks/pull up pants  Bathing - Wash/dry with long strokes  Brushing your teeth - Big, slow movements  Cutting food - Long deliberate cuts  Opening jar/bottle - Move as much as you can with each turn  Picking up a cup/bottle - Open hand up big and get object all the way in palm  Hanging up clothes/getting clothes down from closet - Reach with big effort  Putting away groceries/dishes - Reach with big effort  Wiping counter/table - Move in big, long strokes  Stirring while cooking - Exaggerate movement  Sweeping - Move arms in big, long strokes  Vacuuming - Push with big movement  Folding clothes - Exaggerate arm movements  Walking into a store/restaurant - Walk with big steps, swing arms if able  Standing up from a chair/recliner/sofa - Scoot forward, lean forward, and stand with big effort

## 2017-11-04 ENCOUNTER — Ambulatory Visit: Payer: Medicare Other | Admitting: Occupational Therapy

## 2017-11-04 DIAGNOSIS — R293 Abnormal posture: Secondary | ICD-10-CM | POA: Diagnosis not present

## 2017-11-04 DIAGNOSIS — R2681 Unsteadiness on feet: Secondary | ICD-10-CM

## 2017-11-04 DIAGNOSIS — R29898 Other symptoms and signs involving the musculoskeletal system: Secondary | ICD-10-CM

## 2017-11-04 DIAGNOSIS — R278 Other lack of coordination: Secondary | ICD-10-CM

## 2017-11-04 DIAGNOSIS — R41840 Attention and concentration deficit: Secondary | ICD-10-CM

## 2017-11-04 DIAGNOSIS — R29818 Other symptoms and signs involving the nervous system: Secondary | ICD-10-CM

## 2017-11-04 NOTE — Patient Instructions (Signed)
Coordination Exercises  Perform the following exercises for 10 minutes 1 times per day. Perform with both hand(s). Perform using big movements.   Perform "Flicks"/hand stretches (PWR! Hands): Close hands then flick out your fingers with focus on opening hands, pulling wrists back, and extending elbows like you are pushing.  Flipping Cards: Place deck of cards on the table. Flip cards over by opening your hand big to grasp and then turn your palm up big.     Take one card at a time and flick across table  Pick up 5 coins one at a time and hold in palm. Then, move coins from palm to fingertips one at time and place in coin bank/container.  Practice writing: Slow down, write big, and focus on forming each letter.

## 2017-11-04 NOTE — Therapy (Signed)
[facility] [address] [place], [place], [address] Phone: [phone]   Fax:  [phone]  Occupational Therapy Treatment  Patient Details  Name: [name] MRN: [id number] Date of Birth: [birth year] Referring Provider (Historical): Dr. Krista Blue   Encounter Date: 11/04/2017  OT End of Session - 11/04/17 1058    Visit Number  5    Number of Visits  17    Date for OT Re-Evaluation  12/13/17    Authorization Type  UHC MCR - G code required    Authorization - Visit Number  5    Authorization - Number of Visits  10    OT Start Time  1010    OT Stop Time  1100    OT Time Calculation (min)  50 min    Activity Tolerance  Patient tolerated treatment well    Behavior During Therapy  [facility] for tasks assessed/performed       Past Medical History:  Diagnosis Date  . High blood pressure   . Tremor     Past Surgical History:  Procedure Laterality Date  . HEMORRHOID SURGERY     1980's  . MULTIPLE TOOTH EXTRACTIONS     2016 Has top plate    There were no vitals filed for this visit.  Subjective Assessment - 11/04/17 1013    Subjective   Per wife, "He's been trying to walk more in the house"    Patient is accompained by:  Family member wife    Pertinent History  Drug induced Parkinsonism approx. 5 years, bipolar dz, white matter changes    Patient Stated Goals  Get my hands and arms working better to wash hair, get dressed    Currently in Pain?  Yes    Pain Score  5     Pain Location  Head    Pain Orientation  Right;Left    Pain Descriptors / Indicators  Headache    Pain Type  Acute pain    Pain Onset  Today    Pain Frequency  Constant         [facility] OT Assessment - 11/04/17 0001      Observation/Other Assessments   Observations  Pt appeared more interactive today with more animation (less masked face), less cueing required (except to open hands big), and increased processing speed with verbal instructions                OT Treatments/Exercises (OP) - 11/04/17 0001      Exercises   Exercises  -- UBE x 5 min. Level 3 keeping RPM > 40      Fine Motor Coordination   Other Fine Motor Exercises  Pt/wife issued simplified coordination HEP - see pt instructions for details. Pt performed each with cueing to open fingers big before each ex      Neurological Re-education Exercises   Other Exercises 1  Dynamic standing with large step patterns while flipping cards and placing behind him. Pt required max cues to open hands big for each card. Pt also called out card for cognitive component.     Other Exercises 2  Standing at countertop: bilateral cross reaching to toss scarves to each side for LE wt. shifts, trunk rotation bilaterally, and full elbow extension BUE's.              OT Education - 11/04/17 1033    Education provided  Yes    Education Details  coordination HEP  Person(s) Educated  Patient;Spouse    Methods  Explanation;Demonstration;Handout;Verbal cues    Comprehension  Verbalized understanding;Returned demonstration;Verbal cues required       OT Short Term Goals - 11/01/17 1218      OT SHORT TERM GOAL #1   Title  Pt/family independent with modified PD HEP     Time  4    Period  Weeks    Status  On-going      OT SHORT TERM GOAL #2   Title  Pt to perform dressing with min assist only    Baseline  mod assist    Time  4    Period  Weeks    Status  Achieved      OT SHORT TERM GOAL #3   Title  Pt to perform bathing with min assist only    Baseline  mod assist    Time  4    Period  Weeks    Status  Achieved      OT SHORT TERM GOAL #4   Title  Pt to improve coordination bilaterally as evidenced by reducing speed on 9 hole peg test by 5 sec.     Baseline  Rt = 58.62 sec. Lt = 67.25 sec    Time  4    Period  Weeks    Status  On-going      OT SHORT TERM GOAL #5   Title  Pt/family to verbalize understanding with adaptive strategies to increase ease with ADLS     Time  4    Period  Weeks    Status  On-going        OT Long Term Goals - 10/14/17 1604      OT LONG TERM GOAL #1   Title  Pt/family to verbalize understanding with compensations for cognitive deficits and simple ways to incorporate cognition into tasks    Time  8    Period  Weeks    Status  New    Target Date  12/13/17      OT LONG TERM GOAL #2   Title  Pt to perform BADLS w/ no more than mod cueing, supervision, and set up    Time  8    Period  Weeks    Status  New      OT LONG TERM GOAL #3   Title  Pt to improve ease with eating as evidenced by performing PPT #2 in 23 sec. or under    Baseline  27.69 sec    Time  8    Period  Weeks    Status  New      OT LONG TERM GOAL #4   Title  Pt to have greater ease donning/doffing jacket as evidenced by performing PPT #4 in 28 sec. or under    Baseline  33.12 sec    Time  8    Period  Weeks    Status  New            Plan - 11/04/17 1200    Clinical Impression Statement  Pt with increased overall function today. Pt also participating more in therapy (see observation section). Pt making progress towards all remaining STG's    Occupational Profile and client history currently impacting functional performance  PMH: Drug induced Parkinsonism, bipolar, white matter changes    Rehab Potential  Fair    Current Impairments/barriers affecting progress:  severity of cognitive deficits, time since onset    OT Frequency  2x /  week    OT Duration  8 weeks    OT Treatment/Interventions  Self-care/ADL training;DME and/or AE instruction;Splinting;Therapeutic activities;Therapeutic exercise;Functional Mobility Training;Neuromuscular education;Passive range of motion;Patient/family education;Manual Therapy;Visual/perceptual remediation/compensation;Cognitive remediation/compensation    Plan  continue progress towards STG's    Consulted and Agree with Plan of Care  Patient;Family member/caregiver    Family Member Consulted  wife        Patient will benefit from skilled therapeutic intervention in order to improve the following deficits and impairments:  Decreased coordination, Decreased range of motion, Impaired flexibility, Decreased safety awareness, Impaired sensation, Improper spinal/pelvic alignment, Decreased activity tolerance, Decreased knowledge of precautions, Impaired tone, Decreased balance, Decreased knowledge of use of DME, Impaired UE functional use, Decreased cognition, Decreased mobility, Impaired perceived functional ability  Visit Diagnosis: Other symptoms and signs involving the nervous system  Other symptoms and signs involving the musculoskeletal system  Unsteadiness on feet  Other lack of coordination  Attention and concentration deficit    Problem List Patient Active Problem List   Diagnosis Date Noted  . Memory loss 04/29/2017  . Bipolar I disorder ([place]) 07/30/2016  . Weakness 10/15/2015  . Parkinsonism ([place]) 10/26/2014  . Abnormality of gait 07/25/2014  . High blood pressure   . Tremor     Carey Bullocks, OTR/L 11/04/2017, 12:02 PM  [facility] [address] [place], [place], [address] Phone: [phone]   Fax:  [phone]  Name: [name] MRN: [id number] Date of Birth: 10/25/1961

## 2017-11-15 ENCOUNTER — Ambulatory Visit: Payer: Medicare Other | Admitting: Occupational Therapy

## 2017-11-15 DIAGNOSIS — R278 Other lack of coordination: Secondary | ICD-10-CM

## 2017-11-15 DIAGNOSIS — R41840 Attention and concentration deficit: Secondary | ICD-10-CM

## 2017-11-15 DIAGNOSIS — R2681 Unsteadiness on feet: Secondary | ICD-10-CM

## 2017-11-15 DIAGNOSIS — R293 Abnormal posture: Secondary | ICD-10-CM | POA: Diagnosis not present

## 2017-11-15 DIAGNOSIS — R29898 Other symptoms and signs involving the musculoskeletal system: Secondary | ICD-10-CM

## 2017-11-15 DIAGNOSIS — R29818 Other symptoms and signs involving the nervous system: Secondary | ICD-10-CM

## 2017-11-15 NOTE — Therapy (Signed)
[facility] [address] [place], [place], [address] Phone: [phone]   Fax:  [phone]  Occupational Therapy Treatment  Patient Details  Name: [name] MRN: [id number] Date of Birth: 1961/06/29 Referring Provider (Historical): Dr. Krista Blue   Encounter Date: 11/15/2017  OT End of Session - 11/15/17 1121    Visit Number  6    Number of Visits  17    Date for OT Re-Evaluation  12/13/17    Authorization Type  UHC MCR - G code required    Authorization - Visit Number  6    Authorization - Number of Visits  10    OT Start Time  0930    OT Stop Time  1015    OT Time Calculation (min)  45 min    Activity Tolerance  Patient tolerated treatment well    Behavior During Therapy  [facility] for tasks assessed/performed       Past Medical History:  Diagnosis Date  . High blood pressure   . Tremor     Past Surgical History:  Procedure Laterality Date  . HEMORRHOID SURGERY     1980's  . MULTIPLE TOOTH EXTRACTIONS     2016 Has top plate    There were no vitals filed for this visit.  Subjective Assessment - 11/15/17 0933    Subjective   I just feel a little sluggish    Patient is accompained by:  Family member    Pertinent History  Drug induced Parkinsonism approx. 5 years, bipolar dz, white matter changes    Patient Stated Goals  Get my hands and arms working better to wash hair, get dressed    Currently in Pain?  No/denies         [facility] OT Assessment - 11/15/17 0001      Coordination   Right 9 Hole Peg Test  58.90 sec    Left 9 Hole Peg Test  57.94 sec               OT Treatments/Exercises (OP) - 11/15/17 0001      Exercises   Exercises  -- UBE x 5 min. Level 3 keeping RPM > 40       Fine Motor Coordination   Other Fine Motor Exercises  Reviewed coordination HEP - Pt demo each with cues      Neurological Re-education Exercises   Other Exercises 1  Standing: IR with cane BUE's for large amplitude  movements in prep for ADLS (reaching to pull down shirt, to pull up pants in back), followed by IR/ER stretching BUE's with towel exercise for large amplitude movement    Other Exercises 2  Modified PWR! moves for quadraped over table in standing  x 10 reps each with mod to max cueing, extra time.                OT Short Term Goals - 11/15/17 1121      OT SHORT TERM GOAL #1   Title  Pt/family independent with modified PD HEP     Time  4    Period  Weeks    Status  Achieved      OT SHORT TERM GOAL #2   Title  Pt to perform dressing with min assist only    Baseline  mod assist    Time  4    Period  Weeks    Status  Achieved      OT SHORT TERM GOAL #3  Title  Pt to perform bathing with min assist only    Baseline  mod assist    Time  4    Period  Weeks    Status  Achieved      OT SHORT TERM GOAL #4   Title  Pt to improve coordination bilaterally as evidenced by reducing speed on 9 hole peg test by 5 sec.     Baseline  Rt = 58.62 sec. Lt = 67.25 sec    Time  4    Period  Weeks    Status  Partially Met Met on Lt (57.94 sec), not met on Rt      OT SHORT TERM GOAL #5   Title  Pt/family to verbalize understanding with adaptive strategies to increase ease with ADLS    Time  4    Period  Weeks    Status  On-going        OT Long Term Goals - 10/14/17 1604      OT LONG TERM GOAL #1   Title  Pt/family to verbalize understanding with compensations for cognitive deficits and simple ways to incorporate cognition into tasks    Time  8    Period  Weeks    Status  New    Target Date  12/13/17      OT LONG TERM GOAL #2   Title  Pt to perform BADLS w/ no more than mod cueing, supervision, and set up    Time  8    Period  Weeks    Status  New      OT LONG TERM GOAL #3   Title  Pt to improve ease with eating as evidenced by performing PPT #2 in 23 sec. or under    Baseline  27.69 sec    Time  8    Period  Weeks    Status  New      OT LONG TERM GOAL #4   Title  Pt  to have greater ease donning/doffing jacket as evidenced by performing PPT #4 in 28 sec. or under    Baseline  33.12 sec    Time  8    Period  Weeks    Status  New            Plan - 11/15/17 1123    Clinical Impression Statement  Pt has made improvement in coordination Lt hand. Pt slightly more with flat affect today     Occupational Profile and client history currently impacting functional performance  PMH: Drug induced Parkinsonism, bipolar, white matter changes    Rehab Potential  Fair    Current Impairments/barriers affecting progress:  severity of cognitive deficits, time since onset    OT Frequency  2x / week    OT Duration  8 weeks    OT Treatment/Interventions  Self-care/ADL training;DME and/or AE instruction;Splinting;Therapeutic activities;Therapeutic exercise;Functional Mobility Training;Neuromuscular education;Passive range of motion;Patient/family education;Manual Therapy;Visual/perceptual remediation/compensation;Cognitive remediation/compensation    Plan  review ADL strategies and practice dressing again. Assess remaining STG #5.     Consulted and Agree with Plan of Care  Patient;Family member/caregiver    Family Member Consulted  wife       Patient will benefit from skilled therapeutic intervention in order to improve the following deficits and impairments:  Decreased coordination, Decreased range of motion, Impaired flexibility, Decreased safety awareness, Impaired sensation, Improper spinal/pelvic alignment, Decreased activity tolerance, Decreased knowledge of precautions, Impaired tone, Decreased balance, Decreased knowledge of use of DME, Impaired UE  functional use, Decreased cognition, Decreased mobility, Impaired perceived functional ability  Visit Diagnosis: Other symptoms and signs involving the nervous system  Other symptoms and signs involving the musculoskeletal system  Unsteadiness on feet  Other lack of coordination  Attention and concentration  deficit  Abnormal posture    Problem List Patient Active Problem List   Diagnosis Date Noted  . Memory loss 04/29/2017  . Bipolar I disorder ([place]) 07/30/2016  . Weakness 10/15/2015  . Parkinsonism ([place]) 10/26/2014  . Abnormality of gait 07/25/2014  . High blood pressure   . Tremor     Carey Bullocks, OTR/L 11/15/2017, 11:25 AM  [facility] [address] [place], [place], [address] Phone: [phone]   Fax:  [phone]  Name: [name] MRN: [id number] Date of Birth: 07/12/1961

## 2017-11-17 ENCOUNTER — Encounter: Payer: Self-pay | Admitting: Occupational Therapy

## 2017-11-17 ENCOUNTER — Ambulatory Visit: Payer: Medicare Other | Attending: Neurology | Admitting: Occupational Therapy

## 2017-11-17 DIAGNOSIS — R29818 Other symptoms and signs involving the nervous system: Secondary | ICD-10-CM | POA: Diagnosis present

## 2017-11-17 DIAGNOSIS — R278 Other lack of coordination: Secondary | ICD-10-CM

## 2017-11-17 DIAGNOSIS — R293 Abnormal posture: Secondary | ICD-10-CM | POA: Diagnosis present

## 2017-11-17 DIAGNOSIS — R2681 Unsteadiness on feet: Secondary | ICD-10-CM | POA: Diagnosis present

## 2017-11-17 DIAGNOSIS — R29898 Other symptoms and signs involving the musculoskeletal system: Secondary | ICD-10-CM

## 2017-11-17 DIAGNOSIS — R41840 Attention and concentration deficit: Secondary | ICD-10-CM | POA: Diagnosis present

## 2017-11-17 NOTE — Therapy (Signed)
[facility] [address] [place], [place], [address] Phone: [phone]   Fax:  [phone]  Occupational Therapy Treatment  Patient Details  Name: [name] MRN: [id number] Date of Birth: 1961-08-04 Referring Provider (Historical): Dr. Krista Blue   Encounter Date: 11/17/2017  OT End of Session - 11/17/17 1318    Visit Number  7    Number of Visits  17    Date for OT Re-Evaluation  12/13/17    Authorization Type  UHC MCR - G code required    Authorization - Visit Number  7    Authorization - Number of Visits  10    OT Start Time  1318    OT Stop Time  1400    OT Time Calculation (min)  42 min    Activity Tolerance  Patient tolerated treatment well    Behavior During Therapy  [facility] for tasks assessed/performed       Past Medical History:  Diagnosis Date  . High blood pressure   . Tremor     Past Surgical History:  Procedure Laterality Date  . HEMORRHOID SURGERY     1980's  . MULTIPLE TOOTH EXTRACTIONS     2016 Has top plate    There were no vitals filed for this visit.  Subjective Assessment - 11/17/17 1628    Pertinent History  Drug induced Parkinsonism approx. 5 years, bipolar dz, white matter changes    Patient Stated Goals  Get my hands and arms working better to wash hair, get dressed    Currently in Pain?  No/denies               Treatment: dynamic rock and reach to place grade clothespins on a vertical antennae, targets under feet to maintain wider stance, mod v.c for big movements. Arm bike x 5 mins level 1 for conditioning, pt was able to maintain 40 rpm.            OT Education - 11/17/17 1638    Education provided  Yes    Education Details  Reveiwed adapted strategies for fastening buttons with PWR! hands prior to task, donning/ doffing jacket with big movements, donning/ doffing shoes and tying, Bag exercises for simulated ADLS( behind head, behind back , gather in hand,  underfoot) min-mod v.c/ demonstration    Person(s) Educated  Patient;Spouse    Methods  Explanation;Demonstration;Verbal cues    Comprehension  Returned demonstration;Verbalized understanding;Verbal cues required       OT Short Term Goals - 11/15/17 1121      OT SHORT TERM GOAL #1   Title  Pt/family independent with modified PD HEP     Time  4    Period  Weeks    Status  Achieved      OT SHORT TERM GOAL #2   Title  Pt to perform dressing with min assist only    Baseline  mod assist    Time  4    Period  Weeks    Status  Achieved      OT SHORT TERM GOAL #3   Title  Pt to perform bathing with min assist only    Baseline  mod assist    Time  4    Period  Weeks    Status  Achieved      OT SHORT TERM GOAL #4   Title  Pt to improve coordination bilaterally as evidenced by reducing speed on 9 hole peg test by 5 sec.  Baseline  Rt = 58.62 sec. Lt = 67.25 sec    Time  4    Period  Weeks    Status  Partially Met Met on Lt (57.94 sec), not met on Rt      OT SHORT TERM GOAL #5   Title  Pt/family to verbalize understanding with adaptive strategies to increase ease with ADLS    Time  4    Period  Weeks    Status  On-going        OT Long Term Goals - 10/14/17 1604      OT LONG TERM GOAL #1   Title  Pt/family to verbalize understanding with compensations for cognitive deficits and simple ways to incorporate cognition into tasks    Time  8    Period  Weeks    Status  New    Target Date  12/13/17      OT LONG TERM GOAL #2   Title  Pt to perform BADLS w/ no more than mod cueing, supervision, and set up    Time  8    Period  Weeks    Status  New      OT LONG TERM GOAL #3   Title  Pt to improve ease with eating as evidenced by performing PPT #2 in 23 sec. or under    Baseline  27.69 sec    Time  8    Period  Weeks    Status  New      OT LONG TERM GOAL #4   Title  Pt to have greater ease donning/doffing jacket as evidenced by performing PPT #4 in 28 sec. or under     Baseline  33.12 sec    Time  8    Period  Weeks    Status  New            Plan - 11/17/17 1629    Clinical Impression Statement  Pt is progressing towards goals for adapted strategies for ADLS    Rehab Potential  Fair    Current Impairments/barriers affecting progress:  severity of cognitive deficits, time since onset    OT Frequency  2x / week    OT Duration  8 weeks    OT Treatment/Interventions  Self-care/ADL training;DME and/or AE instruction;Splinting;Therapeutic activities;Therapeutic exercise;Functional Mobility Training;Neuromuscular education;Passive range of motion;Patient/family education;Manual Therapy;Visual/perceptual remediation/compensation;Cognitive remediation/compensation    Plan  continue to reinforce big movements with ADLS/ functional activity    Consulted and Agree with Plan of Care  Patient;Family member/caregiver    Family Member Consulted  wife       Patient will benefit from skilled therapeutic intervention in order to improve the following deficits and impairments:  Decreased coordination, Decreased range of motion, Impaired flexibility, Decreased safety awareness, Impaired sensation, Improper spinal/pelvic alignment, Decreased activity tolerance, Decreased knowledge of precautions, Impaired tone, Decreased balance, Decreased knowledge of use of DME, Impaired UE functional use, Decreased cognition, Decreased mobility, Impaired perceived functional ability  Visit Diagnosis: Other symptoms and signs involving the nervous system  Other symptoms and signs involving the musculoskeletal system  Unsteadiness on feet  Other lack of coordination  Attention and concentration deficit  Abnormal posture    Problem List Patient Active Problem List   Diagnosis Date Noted  . Memory loss 04/29/2017  . Bipolar I disorder ([place]) 07/30/2016  . Weakness 10/15/2015  . Parkinsonism ([place]) 10/26/2014  . Abnormality of gait 07/25/2014  . High blood pressure   .  Tremor  RINE,KATHRYN 11/17/2017, 4:42 PM  [facility] [address] [place], [place], [address] Phone: [phone]   Fax:  [phone]  Name: [name] MRN: [id number] Date of Birth: 03/22/1961

## 2017-11-22 ENCOUNTER — Ambulatory Visit: Payer: Medicare Other | Admitting: Occupational Therapy

## 2017-11-22 DIAGNOSIS — R29818 Other symptoms and signs involving the nervous system: Secondary | ICD-10-CM

## 2017-11-22 DIAGNOSIS — R41840 Attention and concentration deficit: Secondary | ICD-10-CM

## 2017-11-22 DIAGNOSIS — R2681 Unsteadiness on feet: Secondary | ICD-10-CM

## 2017-11-22 DIAGNOSIS — R278 Other lack of coordination: Secondary | ICD-10-CM

## 2017-11-22 DIAGNOSIS — R293 Abnormal posture: Secondary | ICD-10-CM

## 2017-11-22 DIAGNOSIS — R29898 Other symptoms and signs involving the musculoskeletal system: Secondary | ICD-10-CM

## 2017-11-22 NOTE — Therapy (Signed)
[facility] [address] [place], [place], [address] Phone: [phone]   Fax:  [phone]  Occupational Therapy Treatment  Patient Details  Name: [name] MRN: [id number] Date of Birth: 01-20-1961 Referring Provider (Historical): Dr. Krista Blue   Encounter Date: 11/22/2017  OT End of Session - 11/22/17 1203    Visit Number  8    Number of Visits  17    Date for OT Re-Evaluation  12/13/17    Authorization Type  UHC MCR - G code required    OT Start Time  1100    OT Stop Time  1145    OT Time Calculation (min)  45 min    Activity Tolerance  Patient tolerated treatment well    Behavior During Therapy  [facility] for tasks assessed/performed       Past Medical History:  Diagnosis Date  . High blood pressure   . Tremor     Past Surgical History:  Procedure Laterality Date  . HEMORRHOID SURGERY     1980's  . MULTIPLE TOOTH EXTRACTIONS     2016 Has top plate    There were no vitals filed for this visit.  Subjective Assessment - 11/22/17 1107    Patient is accompained by:  Family member wife    Pertinent History  Drug induced Parkinsonism approx. 5 years, bipolar dz, white matter changes    Patient Stated Goals  Get my hands and arms working better to wash hair, get dressed    Currently in Pain?  No/denies                   OT Treatments/Exercises (OP) - 11/22/17 0001      ADLs   ADL Comments  Reviewed and assessed STG #5.       Exercises   Exercises  -- UBE  x 5 min. keeping RPM > 40      Neurological Re-education Exercises   Other Exercises 1  PWR! Twist and Step (modified) in supine x 10 reps each.     Other Exercises 2  Dynamic standing/stepping pattern while cross reaching and ipsilateral tossing of scarves (bilateral sides). Followed by static standing to toss scarf in air BUE's. Rocking stepping patterns to remove and replace clothespins on antenna with focus on large amplitude movements:  stepping, reaching, and rocking               OT Short Term Goals - 11/22/17 1203      OT SHORT TERM GOAL #1   Title  Pt/family independent with modified PD HEP     Time  4    Period  Weeks    Status  Achieved      OT SHORT TERM GOAL #2   Title  Pt to perform dressing with min assist only    Baseline  mod assist    Time  4    Period  Weeks    Status  Achieved      OT SHORT TERM GOAL #3   Title  Pt to perform bathing with min assist only    Baseline  mod assist    Time  4    Period  Weeks    Status  Achieved      OT SHORT TERM GOAL #4   Title  Pt to improve coordination bilaterally as evidenced by reducing speed on 9 hole peg test by 5 sec.     Baseline  Rt = 58.62 sec. Lt =  67.25 sec    Time  4    Period  Weeks    Status  Partially Met Met on Lt (57.94 sec), not met on Rt      OT SHORT TERM GOAL #5   Title  Pt/family to verbalize understanding with adaptive strategies to increase ease with ADLS    Time  4    Period  Weeks    Status  Achieved        OT Long Term Goals - 10/14/17 1604      OT LONG TERM GOAL #1   Title  Pt/family to verbalize understanding with compensations for cognitive deficits and simple ways to incorporate cognition into tasks    Time  8    Period  Weeks    Status  New    Target Date  12/13/17      OT LONG TERM GOAL #2   Title  Pt to perform BADLS w/ no more than mod cueing, supervision, and set up    Time  8    Period  Weeks    Status  New      OT LONG TERM GOAL #3   Title  Pt to improve ease with eating as evidenced by performing PPT #2 in 23 sec. or under    Baseline  27.69 sec    Time  8    Period  Weeks    Status  New      OT LONG TERM GOAL #4   Title  Pt to have greater ease donning/doffing jacket as evidenced by performing PPT #4 in 28 sec. or under    Baseline  33.12 sec    Time  8    Period  Weeks    Status  New            Plan - 11/22/17 1204    Clinical Impression Statement  Pt making steady progress  with ADLS and bigger movements with cueing    Occupational Profile and client history currently impacting functional performance  PMH: Drug induced Parkinsonism, bipolar, white matter changes    Rehab Potential  Fair    OT Frequency  2x / week    OT Duration  8 weeks    OT Treatment/Interventions  Self-care/ADL training;DME and/or AE instruction;Splinting;Therapeutic activities;Therapeutic exercise;Functional Mobility Training;Neuromuscular education;Passive range of motion;Patient/family education;Manual Therapy;Visual/perceptual remediation/compensation;Cognitive remediation/compensation    Plan  continue to reinforce big movements with ADLS/ functional activity, UBE x 10 min., coordination, PWR! supine moves (modified) for functional bed mobility    Consulted and Agree with Plan of Care  Patient;Family member/caregiver    Family Member Consulted  wife       Patient will benefit from skilled therapeutic intervention in order to improve the following deficits and impairments:  Decreased coordination, Decreased range of motion, Impaired flexibility, Decreased safety awareness, Impaired sensation, Improper spinal/pelvic alignment, Decreased activity tolerance, Decreased knowledge of precautions, Impaired tone, Decreased balance, Decreased knowledge of use of DME, Impaired UE functional use, Decreased cognition, Decreased mobility, Impaired perceived functional ability  Visit Diagnosis: Other symptoms and signs involving the nervous system  Other symptoms and signs involving the musculoskeletal system  Unsteadiness on feet  Other lack of coordination  Attention and concentration deficit  Abnormal posture    Problem List Patient Active Problem List   Diagnosis Date Noted  . Memory loss 04/29/2017  . Bipolar I disorder ([place]) 07/30/2016  . Weakness 10/15/2015  . Parkinsonism ([place]) 10/26/2014  . Abnormality of gait 07/25/2014  .  High blood pressure   . Tremor     Carey Bullocks, OTR/L 11/22/2017, 12:05 PM  [facility] [address] [place], [place], [address] Phone: [phone]   Fax:  [phone]  Name: [name] MRN: [id number] Date of Birth: 04-29-1961

## 2017-11-25 ENCOUNTER — Ambulatory Visit: Payer: Medicare Other | Admitting: Occupational Therapy

## 2017-11-25 ENCOUNTER — Other Ambulatory Visit: Payer: Self-pay | Admitting: Internal Medicine

## 2017-11-25 DIAGNOSIS — R29818 Other symptoms and signs involving the nervous system: Secondary | ICD-10-CM

## 2017-11-25 DIAGNOSIS — Z720 Tobacco use: Secondary | ICD-10-CM

## 2017-11-25 DIAGNOSIS — R29898 Other symptoms and signs involving the musculoskeletal system: Secondary | ICD-10-CM

## 2017-11-25 DIAGNOSIS — R293 Abnormal posture: Secondary | ICD-10-CM

## 2017-11-25 NOTE — Therapy (Signed)
[facility] [address] [place], [place], [address] Phone: [phone]   Fax:  [phone]  Occupational Therapy Treatment  Patient Details  Name: [name] MRN: [id number] Date of Birth: 12-13-1960 Referring Provider: Krista Blue   Encounter Date: 11/25/2017  OT End of Session - 11/25/17 1559    Visit Number  9    Number of Visits  17    Date for OT Re-Evaluation  12/13/17    Authorization Type  UHC MCR - G code required    OT Start Time  1400    OT Stop Time  1445    OT Time Calculation (min)  45 min    Activity Tolerance  Patient tolerated treatment well    Behavior During Therapy  [facility] for tasks assessed/performed       Past Medical History:  Diagnosis Date  . High blood pressure   . Tremor     Past Surgical History:  Procedure Laterality Date  . HEMORRHOID SURGERY     1980's  . MULTIPLE TOOTH EXTRACTIONS     2016 Has top plate    There were no vitals filed for this visit.  Subjective Assessment - 11/25/17 1410    Pertinent History  Drug induced Parkinsonism approx. 5 years, bipolar dz, white matter changes    Patient Stated Goals  Get my hands and arms working better to wash hair, get dressed    Currently in Pain?  No/denies                   OT Treatments/Exercises (OP) - 11/25/17 0001      ADLs   UB Dressing  Practiced donning/doffing jacket x 2 with strategies for large amplitude movements    Functional Mobility  Reviewed technique and large amplitude movements for bed mobility including: rolling in bed, scooting in bed, and getting from sit to/from supine      Exercises   Exercises  -- UBE x 5 min. Level 3 keeping RPM > 40      Neurological Re-education Exercises   Other Exercises 1  PWR! Twist in modified quadraped standing over table x 10 reps with mod cueing for full twist, elbow extension, and following hand with eyes. Pt/wife issued this ex as HEP and encouraged to continue to  keep trunk rotation and prevent stiffness in back    Other Exercises 2  Supine: PWR! Twist movement through functional activity (cross reaching for scarves)               OT Short Term Goals - 11/22/17 1203      OT SHORT TERM GOAL #1   Title  Pt/family independent with modified PD HEP     Time  4    Period  Weeks    Status  Achieved      OT SHORT TERM GOAL #2   Title  Pt to perform dressing with min assist only    Baseline  mod assist    Time  4    Period  Weeks    Status  Achieved      OT SHORT TERM GOAL #3   Title  Pt to perform bathing with min assist only    Baseline  mod assist    Time  4    Period  Weeks    Status  Achieved      OT SHORT TERM GOAL #4   Title  Pt to improve coordination bilaterally as evidenced by reducing  speed on 9 hole peg test by 5 sec.     Baseline  Rt = 58.62 sec. Lt = 67.25 sec    Time  4    Period  Weeks    Status  Partially Met Met on Lt (57.94 sec), not met on Rt      OT SHORT TERM GOAL #5   Title  Pt/family to verbalize understanding with adaptive strategies to increase ease with ADLS    Time  4    Period  Weeks    Status  Achieved        OT Long Term Goals - 10/14/17 1604      OT LONG TERM GOAL #1   Title  Pt/family to verbalize understanding with compensations for cognitive deficits and simple ways to incorporate cognition into tasks    Time  8    Period  Weeks    Status  New    Target Date  12/13/17      OT LONG TERM GOAL #2   Title  Pt to perform BADLS w/ no more than mod cueing, supervision, and set up    Time  8    Period  Weeks    Status  New      OT LONG TERM GOAL #3   Title  Pt to improve ease with eating as evidenced by performing PPT #2 in 23 sec. or under    Baseline  27.69 sec    Time  8    Period  Weeks    Status  New      OT LONG TERM GOAL #4   Title  Pt to have greater ease donning/doffing jacket as evidenced by performing PPT #4 in 28 sec. or under    Baseline  33.12 sec    Time  8    Period   Weeks    Status  New            Plan - 11/25/17 1559    Clinical Impression Statement  Pt making steady progress with ADLS and bigger movements with cueing. Pt more engaged and alert when consistently exercising    Occupational Profile and client history currently impacting functional performance  PMH: Drug induced Parkinsonism, bipolar, white matter changes    Rehab Potential  Fair    Current Impairments/barriers affecting progress:  severity of cognitive deficits, time since onset    OT Frequency  2x / week    OT Duration  8 weeks    OT Treatment/Interventions  Self-care/ADL training;DME and/or AE instruction;Splinting;Therapeutic activities;Therapeutic exercise;Functional Mobility Training;Neuromuscular education;Passive range of motion;Patient/family education;Manual Therapy;Visual/perceptual remediation/compensation;Cognitive remediation/compensation    Plan  coordination, dynamic standing/reaching, UBE x 8 min    Consulted and Agree with Plan of Care  Patient;Family member/caregiver    Family Member Consulted  wife       Patient will benefit from skilled therapeutic intervention in order to improve the following deficits and impairments:  Decreased coordination, Decreased range of motion, Impaired flexibility, Decreased safety awareness, Impaired sensation, Improper spinal/pelvic alignment, Decreased activity tolerance, Decreased knowledge of precautions, Impaired tone, Decreased balance, Decreased knowledge of use of DME, Impaired UE functional use, Decreased cognition, Decreased mobility, Impaired perceived functional ability  Visit Diagnosis: Other symptoms and signs involving the nervous system  Other symptoms and signs involving the musculoskeletal system  Abnormal posture    Problem List Patient Active Problem List   Diagnosis Date Noted  . Memory loss 04/29/2017  . Bipolar I disorder ([place]) 07/30/2016  .  Weakness 10/15/2015  . Parkinsonism ([place]) 10/26/2014  .  Abnormality of gait 07/25/2014  . High blood pressure   . Tremor     Carey Bullocks, OTR/L 11/25/2017, 4:01 PM  [facility] [address] [place], [place], [address] Phone: [phone]   Fax:  [phone]  Name: Jasper Hanf MRN: [id number] Date of Birth: Aug 08, 1961

## 2017-11-29 ENCOUNTER — Ambulatory Visit: Payer: Medicare Other | Admitting: Occupational Therapy

## 2017-11-29 DIAGNOSIS — R29818 Other symptoms and signs involving the nervous system: Secondary | ICD-10-CM | POA: Diagnosis not present

## 2017-11-29 DIAGNOSIS — R2681 Unsteadiness on feet: Secondary | ICD-10-CM

## 2017-11-29 DIAGNOSIS — R41840 Attention and concentration deficit: Secondary | ICD-10-CM

## 2017-11-29 DIAGNOSIS — R278 Other lack of coordination: Secondary | ICD-10-CM

## 2017-11-29 DIAGNOSIS — R29898 Other symptoms and signs involving the musculoskeletal system: Secondary | ICD-10-CM

## 2017-11-29 DIAGNOSIS — R293 Abnormal posture: Secondary | ICD-10-CM

## 2017-11-29 NOTE — Therapy (Signed)
[facility] [address] [place], [place], [address] Phone: [phone]   Fax:  [phone]  Occupational Therapy Treatment  Patient Details  Name: [name] MRN: [id number] Date of Birth: [birth year] Referring Provider: Krista Blue   Encounter Date: 11/29/2017  OT End of Session - 11/29/17 1153    Visit Number  10    Number of Visits  17    Date for OT Re-Evaluation  12/13/17    Authorization Type  UHC MCR - G code required    OT Start Time  1100    OT Stop Time  1145    OT Time Calculation (min)  45 min    Activity Tolerance  Patient tolerated treatment well       Past Medical History:  Diagnosis Date  . High blood pressure   . Tremor     Past Surgical History:  Procedure Laterality Date  . HEMORRHOID SURGERY     1980's  . MULTIPLE TOOTH EXTRACTIONS     2016 Has top plate    There were no vitals filed for this visit.  Subjective Assessment - 11/29/17 1105    Subjective   Just sluggish this morning    Patient is accompained by:  Family member    Pertinent History  Drug induced Parkinsonism approx. 5 years, bipolar dz, white matter changes    Patient Stated Goals  Get my hands and arms working better to wash hair, get dressed    Currently in Pain?  No/denies                   OT Treatments/Exercises (OP) - 11/29/17 0001      Exercises   Exercises  -- UBE x 8 min. Level 3 keeping ROM > 40      Neurological Re-education Exercises   Other Exercises 1  Standing: weight shifts laterally and forward/backwards with reaching activities    Other Exercises 2  Stepping patterns bilaterally for backwards stepping to retrieve medium sized pegs and placing in pegboard on vertical surface               OT Short Term Goals - 11/22/17 1203      OT SHORT TERM GOAL #1   Title  Pt/family independent with modified PD HEP     Time  4    Period  Weeks    Status  Achieved      OT SHORT TERM GOAL  #2   Title  Pt to perform dressing with min assist only    Baseline  mod assist    Time  4    Period  Weeks    Status  Achieved      OT SHORT TERM GOAL #3   Title  Pt to perform bathing with min assist only    Baseline  mod assist    Time  4    Period  Weeks    Status  Achieved      OT SHORT TERM GOAL #4   Title  Pt to improve coordination bilaterally as evidenced by reducing speed on 9 hole peg test by 5 sec.     Baseline  Rt = 58.62 sec. Lt = 67.25 sec    Time  4    Period  Weeks    Status  Partially Met Met on Lt (57.94 sec), not met on Rt      OT SHORT TERM GOAL #5   Title  Pt/family to verbalize  understanding with adaptive strategies to increase ease with ADLS    Time  4    Period  Weeks    Status  Achieved        OT Long Term Goals - 10/14/17 1604      OT LONG TERM GOAL #1   Title  Pt/family to verbalize understanding with compensations for cognitive deficits and simple ways to incorporate cognition into tasks    Time  8    Period  Weeks    Status  New    Target Date  12/13/17      OT LONG TERM GOAL #2   Title  Pt to perform BADLS w/ no more than mod cueing, supervision, and set up    Time  8    Period  Weeks    Status  New      OT LONG TERM GOAL #3   Title  Pt to improve ease with eating as evidenced by performing PPT #2 in 23 sec. or under    Baseline  27.69 sec    Time  8    Period  Weeks    Status  New      OT LONG TERM GOAL #4   Title  Pt to have greater ease donning/doffing jacket as evidenced by performing PPT #4 in 28 sec. or under    Baseline  33.12 sec    Time  8    Period  Weeks    Status  New            Plan - 11/29/17 1154    Clinical Impression Statement  Pt progressing with dynamic standing balance, endurance, and alertness    Rehab Potential  Fair    Current Impairments/barriers affecting progress:  severity of cognitive deficits, time since onset    OT Frequency  2x / week    OT Duration  8 weeks    OT  Treatment/Interventions  Self-care/ADL training;DME and/or AE instruction;Splinting;Therapeutic activities;Therapeutic exercise;Functional Mobility Training;Neuromuscular education;Passive range of motion;Patient/family education;Manual Therapy;Visual/perceptual remediation/compensation;Cognitive remediation/compensation    Plan  review bag exercises, PWR! Up seated, and PWR! Twist in modified quadraped    Consulted and Agree with Plan of Care  Patient;Family member/caregiver    Family Member Consulted  wife       Patient will benefit from skilled therapeutic intervention in order to improve the following deficits and impairments:  Decreased coordination, Decreased range of motion, Impaired flexibility, Decreased safety awareness, Impaired sensation, Improper spinal/pelvic alignment, Decreased activity tolerance, Decreased knowledge of precautions, Impaired tone, Decreased balance, Decreased knowledge of use of DME, Impaired UE functional use, Decreased cognition, Decreased mobility, Impaired perceived functional ability  Visit Diagnosis: Other symptoms and signs involving the nervous system  Other symptoms and signs involving the musculoskeletal system  Abnormal posture  Unsteadiness on feet  Other lack of coordination  Attention and concentration deficit    Problem List Patient Active Problem List   Diagnosis Date Noted  . Memory loss 04/29/2017  . Bipolar I disorder ([place]) 07/30/2016  . Weakness 10/15/2015  . Parkinsonism ([place]) 10/26/2014  . Abnormality of gait 07/25/2014  . High blood pressure   . Tremor     Carey Bullocks, OTR/L 11/29/2017, 12:01 PM  [facility] [address] [place], [place], [address] Phone: [phone]   Fax:  [phone]  Name: [name] MRN: [id number] Date of Birth: [birth year]

## 2017-12-01 ENCOUNTER — Ambulatory Visit: Payer: Medicare Other | Admitting: Occupational Therapy

## 2017-12-01 DIAGNOSIS — R29818 Other symptoms and signs involving the nervous system: Secondary | ICD-10-CM

## 2017-12-01 DIAGNOSIS — R29898 Other symptoms and signs involving the musculoskeletal system: Secondary | ICD-10-CM

## 2017-12-01 DIAGNOSIS — R293 Abnormal posture: Secondary | ICD-10-CM

## 2017-12-01 DIAGNOSIS — R278 Other lack of coordination: Secondary | ICD-10-CM

## 2017-12-01 DIAGNOSIS — R2681 Unsteadiness on feet: Secondary | ICD-10-CM

## 2017-12-01 NOTE — Therapy (Signed)
[facility] [address] [place], [place], [address] Phone: [phone]   Fax:  [phone]  Occupational Therapy Treatment  Patient Details  Name: [name] MRN: [id number] Date of Birth: December 16, 1960 Referring Provider: Krista Blue   Encounter Date: 12/01/2017  OT End of Session - 12/01/17 1307    Visit Number  11    Number of Visits  17    Date for OT Re-Evaluation  12/13/17    Authorization Type  UHC MCR - G code required    OT Start Time  1232    OT Stop Time  1315    OT Time Calculation (min)  43 min    Activity Tolerance  Patient tolerated treatment well    Behavior During Therapy  WFL for tasks assessed/performed       Past Medical History:  Diagnosis Date  . High blood pressure   . Tremor     Past Surgical History:  Procedure Laterality Date  . HEMORRHOID SURGERY     1980's  . MULTIPLE TOOTH EXTRACTIONS     2016 Has top plate    There were no vitals filed for this visit.  Subjective Assessment - 12/01/17 1233    Patient is accompained by:  Family member    Pertinent History  Drug induced Parkinsonism approx. 5 years, bipolar dz, white matter changes    Patient Stated Goals  Get my hands and arms working better to wash hair, get dressed    Currently in Pain?  No/denies                   OT Treatments/Exercises (OP) - 12/01/17 0001      Exercises   Exercises  -- UBE x 8 min. Level 3, keeping RPM > 40      Neurological Re-education Exercises   Other Exercises 1  Reviewed bag ex's, PWR! Up seated, PWR! Twist modified quadraped, and PWR! Hands up. Pt demo each x 10 reps. ALso did PWR! Twist in standing per wife request               OT Short Term Goals - 11/22/17 1203      OT SHORT TERM GOAL #1   Title  Pt/family independent with modified PD HEP     Time  4    Period  Weeks    Status  Achieved      OT SHORT TERM GOAL #2   Title  Pt to perform dressing with min assist  only    Baseline  mod assist    Time  4    Period  Weeks    Status  Achieved      OT SHORT TERM GOAL #3   Title  Pt to perform bathing with min assist only    Baseline  mod assist    Time  4    Period  Weeks    Status  Achieved      OT SHORT TERM GOAL #4   Title  Pt to improve coordination bilaterally as evidenced by reducing speed on 9 hole peg test by 5 sec.     Baseline  Rt = 58.62 sec. Lt = 67.25 sec    Time  4    Period  Weeks    Status  Partially Met Met on Lt (57.94 sec), not met on Rt      OT SHORT TERM GOAL #5   Title  Pt/family to verbalize understanding with adaptive strategies  to increase ease with ADLS    Time  4    Period  Weeks    Status  Achieved        OT Long Term Goals - 12/01/17 1308      OT LONG TERM GOAL #1   Title  Pt/family to verbalize understanding with compensations for cognitive deficits and simple ways to incorporate cognition into tasks    Time  8    Period  Weeks    Status  On-going      OT LONG TERM GOAL #2   Title  Pt to perform BADLS w/ no more than mod cueing, supervision, and set up    Time  8    Period  Weeks    Status  On-going      OT LONG TERM GOAL #3   Title  Pt to improve ease with eating as evidenced by performing PPT #2 in 23 sec. or under    Baseline  27.69 sec    Time  8    Period  Weeks    Status  On-going      OT LONG TERM GOAL #4   Title  Pt to have greater ease donning/doffing jacket as evidenced by performing PPT #4 in 28 sec. or under    Baseline  33.12 sec    Time  8    Period  Weeks    Status  On-going            Plan - 12/01/17 1308    Clinical Impression Statement  Pt progressing with dynamic standing balance, endurance, and alertness. Anticipate d/c next week    Occupational Profile and client history currently impacting functional performance  PMH: Drug induced Parkinsonism, bipolar, white matter changes    Rehab Potential  Fair    OT Frequency  2x / week    OT Duration  8 weeks    OT  Treatment/Interventions  Self-care/ADL training;DME and/or AE instruction;Splinting;Therapeutic activities;Therapeutic exercise;Functional Mobility Training;Neuromuscular education;Passive range of motion;Patient/family education;Manual Therapy;Visual/perceptual remediation/compensation;Cognitive remediation/compensation    Plan  review coordination HEP, begin assessing remaining goals - d/c end of next week    Consulted and Agree with Plan of Care  Patient;Family member/caregiver    Family Member Consulted  wife       Patient will benefit from skilled therapeutic intervention in order to improve the following deficits and impairments:  Decreased coordination, Decreased range of motion, Impaired flexibility, Decreased safety awareness, Impaired sensation, Improper spinal/pelvic alignment, Decreased activity tolerance, Decreased knowledge of precautions, Impaired tone, Decreased balance, Decreased knowledge of use of DME, Impaired UE functional use, Decreased cognition, Decreased mobility, Impaired perceived functional ability  Visit Diagnosis: Other symptoms and signs involving the nervous system  Other symptoms and signs involving the musculoskeletal system  Abnormal posture  Unsteadiness on feet  Other lack of coordination    Problem List Patient Active Problem List   Diagnosis Date Noted  . Memory loss 04/29/2017  . Bipolar I disorder ([place]) 07/30/2016  . Weakness 10/15/2015  . Parkinsonism ([place]) 10/26/2014  . Abnormality of gait 07/25/2014  . High blood pressure   . Tremor     Carey Bullocks, OTR/L 12/01/2017, 1:10 PM  [facility] [address] [place], [place], [address] Phone: [phone]   Fax:  [phone]  Name: [name] MRN: [id number] Date of Birth: 14-Nov-1961

## 2017-12-03 ENCOUNTER — Ambulatory Visit
Admission: RE | Admit: 2017-12-03 | Discharge: 2017-12-03 | Disposition: A | Discharge status: Home / Self Care | Payer: Medicare Other | Source: Ambulatory Visit | Attending: Internal Medicine | Admitting: Internal Medicine

## 2017-12-03 DIAGNOSIS — Z720 Tobacco use: Secondary | ICD-10-CM

## 2017-12-06 ENCOUNTER — Ambulatory Visit: Payer: Medicare Other | Admitting: Occupational Therapy

## 2017-12-06 DIAGNOSIS — R278 Other lack of coordination: Secondary | ICD-10-CM

## 2017-12-06 DIAGNOSIS — R29818 Other symptoms and signs involving the nervous system: Secondary | ICD-10-CM

## 2017-12-06 DIAGNOSIS — R29898 Other symptoms and signs involving the musculoskeletal system: Secondary | ICD-10-CM

## 2017-12-06 NOTE — Therapy (Signed)
[facility] [address] [place], [place], [address] Phone: [phone]   Fax:  [phone]  Occupational Therapy Treatment  Patient Details  Name: [name] MRN: [id number] Date of Birth: [birth year] Referring Provider: Krista Blue   Encounter Date: 12/06/2017  OT End of Session - 12/06/17 1406    Visit Number  12    Number of Visits  17    Date for OT Re-Evaluation  12/13/17    Authorization Type  UHC MCR - G code required    OT Start Time  1230    OT Stop Time  1315    OT Time Calculation (min)  45 min    Activity Tolerance  Patient tolerated treatment well    Behavior During Therapy  WFL for tasks assessed/performed       Past Medical History:  Diagnosis Date  . High blood pressure   . Tremor     Past Surgical History:  Procedure Laterality Date  . HEMORRHOID SURGERY     1980's  . MULTIPLE TOOTH EXTRACTIONS     2016 Has top plate    There were no vitals filed for this visit.  Subjective Assessment - 12/06/17 1235    Subjective   I'm fair    Patient is accompained by:  Family member    Pertinent History  Drug induced Parkinsonism approx. 5 years, bipolar dz, white matter changes    Patient Stated Goals  Get my hands and arms working better to wash hair, get dressed    Currently in Pain?  No/denies                   OT Treatments/Exercises (OP) - 12/06/17 0001      ADLs   UB Dressing  Pt practiced donning/doffing pullover shirt and coat. Pt only needed assist to hold thermal underneath shirt down while pulling top shirt off    LB Dressing  Pt donned/doffed pants and shoes w/o assist.     ADL Comments  Pt/wife given POP support group info and drumming class for PD info. Encouraged pt/caregiver to attend POP and pt to attend drumming class      Exercises   Exercises  -- UBE x 8 min. Level 3, attempting to keep RPM > 40      Fine Motor Coordination (Hand/Wrist)   Other Fine Motor Exercises   Reviewed coordination HEP with pt/family               OT Short Term Goals - 11/22/17 1203      OT SHORT TERM GOAL #1   Title  Pt/family independent with modified PD HEP     Time  4    Period  Weeks    Status  Achieved      OT SHORT TERM GOAL #2   Title  Pt to perform dressing with min assist only    Baseline  mod assist    Time  4    Period  Weeks    Status  Achieved      OT SHORT TERM GOAL #3   Title  Pt to perform bathing with min assist only    Baseline  mod assist    Time  4    Period  Weeks    Status  Achieved      OT SHORT TERM GOAL #4   Title  Pt to improve coordination bilaterally as evidenced by reducing speed on 9 hole peg test by 5  sec.     Baseline  Rt = 58.62 sec. Lt = 67.25 sec    Time  4    Period  Weeks    Status  Partially Met Met on Lt (57.94 sec), not met on Rt      OT SHORT TERM GOAL #5   Title  Pt/family to verbalize understanding with adaptive strategies to increase ease with ADLS    Time  4    Period  Weeks    Status  Achieved        OT Long Term Goals - 12/06/17 1407      OT LONG TERM GOAL #1   Title  Pt/family to verbalize understanding with compensations for cognitive deficits and simple ways to incorporate cognition into tasks    Time  8    Period  Weeks    Status  Achieved      OT LONG TERM GOAL #2   Title  Pt to perform BADLS w/ no more than mod cueing, supervision, and set up    Time  8    Period  Weeks    Status  Partially Met Met in clinic, inconsistent at home      OT LONG TERM GOAL #3   Title  Pt to improve ease with eating as evidenced by performing PPT #2 in 23 sec. or under    Baseline  27.69 sec    Time  8    Period  Weeks    Status  On-going      OT LONG TERM GOAL #4   Title  Pt to have greater ease donning/doffing jacket as evidenced by performing PPT #4 in 28 sec. or under    Baseline  33.12 sec    Time  8    Period  Weeks    Status  On-going            Plan - 12/06/17 1408    Clinical  Impression Statement  Pt has met LTG #1 and inconsistent with meeting LTG #2.     Occupational Profile and client history currently impacting functional performance  PMH: Drug induced Parkinsonism, bipolar, white matter changes    Rehab Potential  Fair    Current Impairments/barriers affecting progress:  severity of cognitive deficits, time since onset    OT Frequency  2x / week    OT Duration  8 weeks    OT Treatment/Interventions  Self-care/ADL training;DME and/or AE instruction;Splinting;Therapeutic activities;Therapeutic exercise;Functional Mobility Training;Neuromuscular education;Passive range of motion;Patient/family education;Manual Therapy;Visual/perceptual remediation/compensation;Cognitive remediation/compensation    Plan  Re-assess STG #4 and LTG's #3 and #4. D/C next session    Consulted and Agree with Plan of Care  Patient;Family member/caregiver    Family Member Consulted  wife       Patient will benefit from skilled therapeutic intervention in order to improve the following deficits and impairments:  Decreased coordination, Decreased range of motion, Impaired flexibility, Decreased safety awareness, Impaired sensation, Improper spinal/pelvic alignment, Decreased activity tolerance, Decreased knowledge of precautions, Impaired tone, Decreased balance, Decreased knowledge of use of DME, Impaired UE functional use, Decreased cognition, Decreased mobility, Impaired perceived functional ability  Visit Diagnosis: Other symptoms and signs involving the nervous system  Other symptoms and signs involving the musculoskeletal system  Other lack of coordination    Problem List Patient Active Problem List   Diagnosis Date Noted  . Memory loss 04/29/2017  . Bipolar I disorder ([facility]) 07/30/2016  . Weakness 10/15/2015  . Parkinsonism ([facility]) 10/26/2014  .  Abnormality of gait 07/25/2014  . High blood pressure   . Tremor     Carey Bullocks, OTR/L 12/06/2017, 2:10 PM  [facility] [address] [place], [place], [address] Phone: [phone]   Fax:  [phone]  Name: Shyhiem Beeney MRN: [id number] Date of Birth: Jan 24, 1961

## 2017-12-08 ENCOUNTER — Ambulatory Visit: Payer: Medicare Other | Admitting: Occupational Therapy

## 2017-12-08 DIAGNOSIS — R29818 Other symptoms and signs involving the nervous system: Secondary | ICD-10-CM

## 2017-12-08 DIAGNOSIS — R278 Other lack of coordination: Secondary | ICD-10-CM

## 2017-12-08 DIAGNOSIS — R293 Abnormal posture: Secondary | ICD-10-CM

## 2017-12-08 DIAGNOSIS — R2681 Unsteadiness on feet: Secondary | ICD-10-CM

## 2017-12-08 DIAGNOSIS — R29898 Other symptoms and signs involving the musculoskeletal system: Secondary | ICD-10-CM

## 2017-12-08 NOTE — Therapy (Signed)
[facility] [address] [place], [place], [address] Phone: [phone]   Fax:  [phone]  Occupational Therapy Treatment  Patient Details  Name: [name] MRN: [id number] Date of Birth: [birth year] Referring Provider: Krista Blue   Encounter Date: 12/08/2017  OT End of Session - 12/08/17 1407    Visit Number  13    Number of Visits  17    Date for OT Re-Evaluation  12/13/17    Authorization Type  UHC MCR -     OT Start Time  1315    OT Stop Time  1400    OT Time Calculation (min)  45 min    Activity Tolerance  Patient tolerated treatment well       Past Medical History:  Diagnosis Date  . High blood pressure   . Tremor     Past Surgical History:  Procedure Laterality Date  . HEMORRHOID SURGERY     1980's  . MULTIPLE TOOTH EXTRACTIONS     2016 Has top plate    There were no vitals filed for this visit.  Subjective Assessment - 12/08/17 1329    Patient is accompained by:  Family member    Pertinent History  Drug induced Parkinsonism approx. 5 years, bipolar dz, white matter changes    Patient Stated Goals  Get my hands and arms working better to wash hair, get dressed    Currently in Pain?  No/denies         [facility] OT Assessment - 12/08/17 0001      Observation/Other Assessments   Simulated Eating Time (seconds)  22.94 sec    Donning Doffing Jacket Time (seconds)  26.88 sec               OT Treatments/Exercises (OP) - 12/08/17 0001      ADLs   UB Dressing  Practiced donning/doffing jacket x 2 with adaptive strategies reinforced    ADL Comments  Assessed/reviewed remaining goals and progress to date with pt/family friend      Exercises   Exercises  -- UBE x 8 min. (4 min. forwards, 4 min. backwards      Neurological Re-education Exercises   Other Exercises 1  Modified quadraped PWR! Twist x 20 reps with cues for large amplitude movements and to follow hand with head/eyes    Other  Exercises 2  PWR! Hands with cues for full elbow and wrist extension especially on Rt side               OT Short Term Goals - 12/08/17 1408      OT SHORT TERM GOAL #1   Title  Pt/family independent with modified PD HEP     Time  4    Period  Weeks    Status  Achieved      OT SHORT TERM GOAL #2   Title  Pt to perform dressing with min assist only    Baseline  mod assist    Time  4    Period  Weeks    Status  Achieved      OT SHORT TERM GOAL #3   Title  Pt to perform bathing with min assist only    Baseline  mod assist    Time  4    Period  Weeks    Status  Achieved      OT SHORT TERM GOAL #4   Title  Pt to improve coordination bilaterally as evidenced by reducing speed  on 9 hole peg test by 5 sec.     Baseline  Rt = 58.62 sec. Lt = 67.25 sec    Time  4    Period  Weeks    Status  Partially Met Met on Lt (57.94 sec), not met on Rt (56.65 sec)      OT SHORT TERM GOAL #5   Title  Pt/family to verbalize understanding with adaptive strategies to increase ease with ADLS    Time  4    Period  Weeks    Status  Achieved        OT Long Term Goals - 12/08/17 1408      OT LONG TERM GOAL #1   Title  Pt/family to verbalize understanding with compensations for cognitive deficits and simple ways to incorporate cognition into tasks    Time  8    Period  Weeks    Status  Achieved      OT LONG TERM GOAL #2   Title  Pt to perform BADLS w/ no more than mod cueing, supervision, and set up    Time  8    Period  Weeks    Status  Partially Met Met in clinic, inconsistent at home      OT LONG TERM GOAL #3   Title  Pt to improve ease with eating as evidenced by performing PPT #2 in 23 sec. or under    Baseline  27.69 sec    Time  8    Period  Weeks    Status  Achieved 22.94 sec      OT LONG TERM GOAL #4   Title  Pt to have greater ease donning/doffing jacket as evidenced by performing PPT #4 in 28 sec. or under    Baseline  33.12 sec    Time  8    Period  Weeks     Status  Achieved 26.88 sec            Plan - 12/08/17 1409    Clinical Impression Statement  Pt has met LTG's.     Occupational performance deficits (Please refer to evaluation for details):  ADL's;IADL's;Leisure    OT Treatment/Interventions  Self-care/ADL training;DME and/or AE instruction;Splinting;Therapeutic activities;Therapeutic exercise;Functional Mobility Training;Neuromuscular education;Passive range of motion;Patient/family education;Manual Therapy;Visual/perceptual remediation/compensation;Cognitive remediation/compensation    Plan  D/C O.Donnajean Lopes and Agree with Plan of Care  Patient;Family member/caregiver    Family Member Consulted  Friend       Patient will benefit from skilled therapeutic intervention in order to improve the following deficits and impairments:  Decreased coordination, Decreased range of motion, Impaired flexibility, Decreased safety awareness, Impaired sensation, Improper spinal/pelvic alignment, Decreased activity tolerance, Decreased knowledge of precautions, Impaired tone, Decreased balance, Decreased knowledge of use of DME, Impaired UE functional use, Decreased cognition, Decreased mobility, Impaired perceived functional ability  Visit Diagnosis: Other symptoms and signs involving the nervous system  Other symptoms and signs involving the musculoskeletal system  Other lack of coordination  Unsteadiness on feet  Abnormal posture    Problem List Patient Active Problem List   Diagnosis Date Noted  . Memory loss 04/29/2017  . Bipolar I disorder ([place]) 07/30/2016  . Weakness 10/15/2015  . Parkinsonism ([facility]) 10/26/2014  . Abnormality of gait 07/25/2014  . High blood pressure   . Tremor    OCCUPATIONAL THERAPY DISCHARGE SUMMARY  Visits from Start of Care: 13  Current functional level related to goals / functional outcomes: See above  Remaining deficits: Bradykinesia Engineer, civil (consulting) / Equipment: Pt/wife provided with  PD specific HEP, Adaptive strategies for ADLS,  POP support group info, and ways to incorporate cognitive tasks into daily routine Plan: Patient agrees to discharge.  Patient goals were met. Patient is being discharged due to meeting the stated rehab goals.  ?????        Carey Bullocks, OTR/L 12/08/2017, 2:10 PM  [facility] [address] [place], [place], [address] Phone: [phone]   Fax:  [phone]  Name: [name] MRN: [id number] Date of Birth: 1961/06/29

## 2018-05-27 ENCOUNTER — Encounter: Payer: Self-pay | Admitting: Podiatry

## 2018-05-27 ENCOUNTER — Ambulatory Visit (INDEPENDENT_AMBULATORY_CARE_PROVIDER_SITE_OTHER): Payer: Medicare Other

## 2018-05-27 ENCOUNTER — Ambulatory Visit: Payer: Medicare Other | Admitting: Podiatry

## 2018-05-27 DIAGNOSIS — L600 Ingrowing nail: Secondary | ICD-10-CM

## 2018-05-27 DIAGNOSIS — S99922A Unspecified injury of left foot, initial encounter: Secondary | ICD-10-CM | POA: Diagnosis not present

## 2018-05-27 NOTE — Patient Instructions (Signed)

## 2018-05-27 NOTE — Progress Notes (Signed)
Subjective:   Patient ID: Grace Isaac, male   DOB: 57 y.o.   MRN: [id number]   HPI Patient presents with severely thickened damaged hallux nails bilateral that they can no longer cut and they are increasingly sore and on the left lateral foot he has had an injury where he turned his foot and they are worried about fracture   ROS      Objective:  Physical Exam  Neurovascular status was found to be intact with patient having good digital perfusion of bilateral.  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  Patient wants procedure performed understanding what would be required and at this point I explained the surgery and risks associated with it.  Patient is willing to accept risk and at this point I infiltrated each hallux 60 mg like Marcaine mixture under sterile conditions I removed the hallux nails exposed matrix I applied phenol 5 applications 30 seconds followed by alcohol lavage and sterile dressing and instructed on soaks and reappoint.  Encouraged to call with any questions he may have  X-ray was negative for fracture of the left foot with mild soft tissue swelling noted

## 2018-06-09 ENCOUNTER — Other Ambulatory Visit: Payer: Self-pay | Admitting: Internal Medicine

## 2018-06-09 DIAGNOSIS — R911 Solitary pulmonary nodule: Secondary | ICD-10-CM

## 2018-06-20 ENCOUNTER — Ambulatory Visit
Admission: RE | Admit: 2018-06-20 | Discharge: 2018-06-20 | Disposition: A | Discharge status: Home / Self Care | Payer: Medicare Other | Source: Ambulatory Visit | Attending: Internal Medicine | Admitting: Internal Medicine

## 2018-06-20 DIAGNOSIS — R911 Solitary pulmonary nodule: Secondary | ICD-10-CM

## 2018-09-28 ENCOUNTER — Other Ambulatory Visit: Payer: Self-pay | Admitting: Physician Assistant

## 2018-10-03 ENCOUNTER — Other Ambulatory Visit: Payer: Self-pay | Admitting: Neurology

## 2018-10-25 IMAGING — CT CT CHEST LUNG CANCER SCREENING LOW DOSE W/O CM
1 of 5 series · 15 of 40 positions shown, 19 images · non-contrast
Comparison: None.

CLINICAL DATA: 55-year-old male with 80 pack-year history of
smoking. Lung cancer screening.

EXAM:
CT CHEST WITHOUT CONTRAST LOW-DOSE FOR LUNG CANCER SCREENING
TECHNIQUE: Multidetector CT imaging of the chest was performed following the
standard protocol without IV contrast.

[Series 3: lung windows · axial · 0.67mm/px · z∈[-326,+22]mm · 15 of 313 slices shown, 19 images]
[im 17/313  mediastinal]
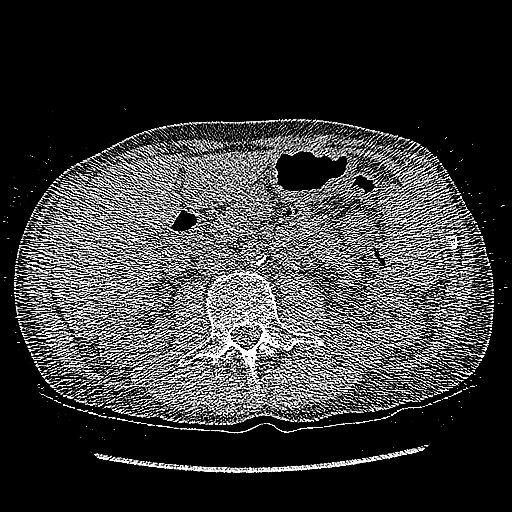
[im 17/313  lung]
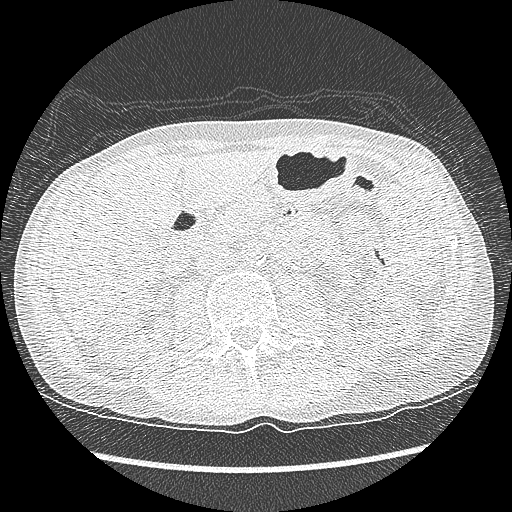
[im 33/313  lung]
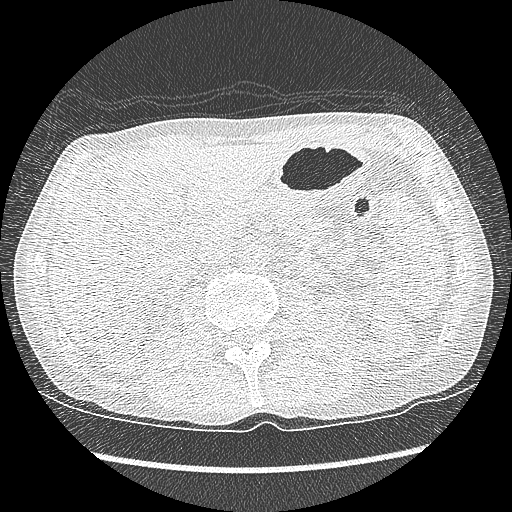
[im 66/313  lung]
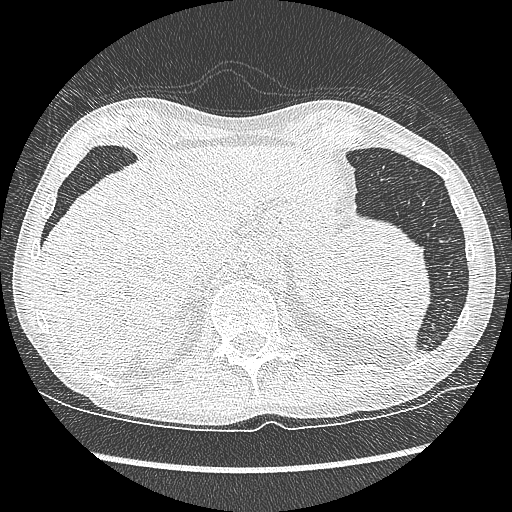
[im 83/313  lung]
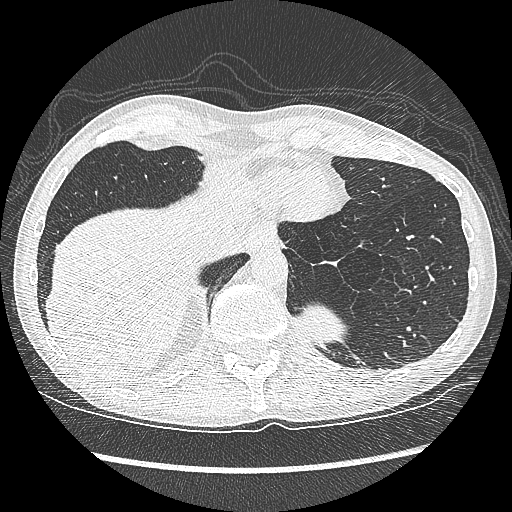
[im 99/313  mediastinal]
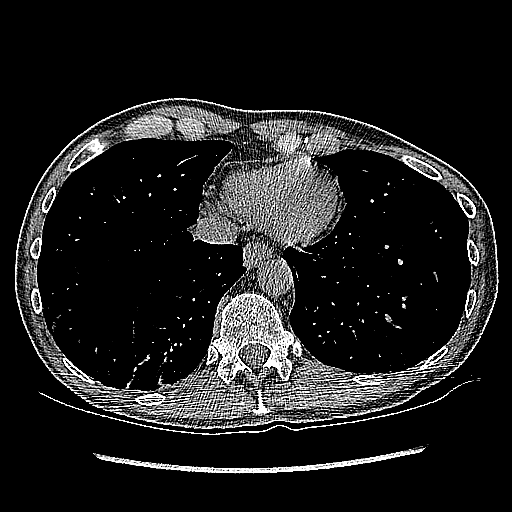
[im 99/313  lung]
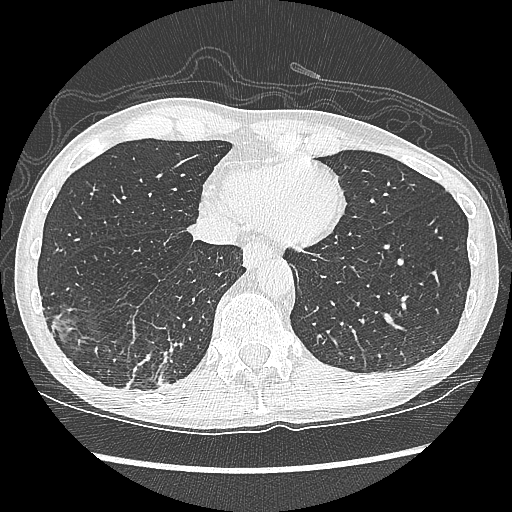
[im 115/313  lung]
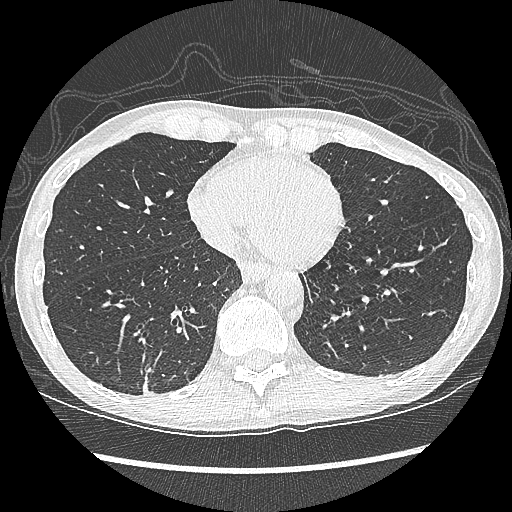
[im 132/313  lung]
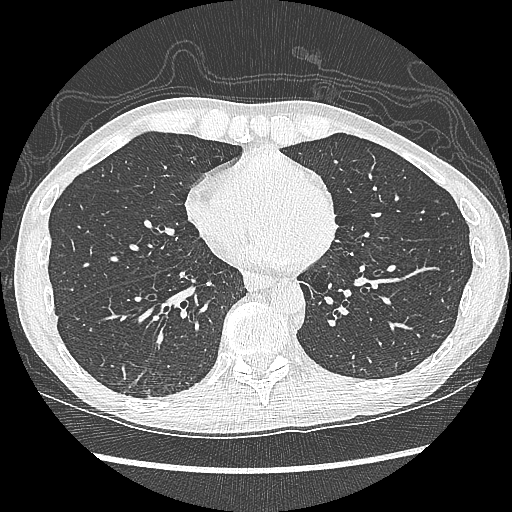
[im 165/313  lung]
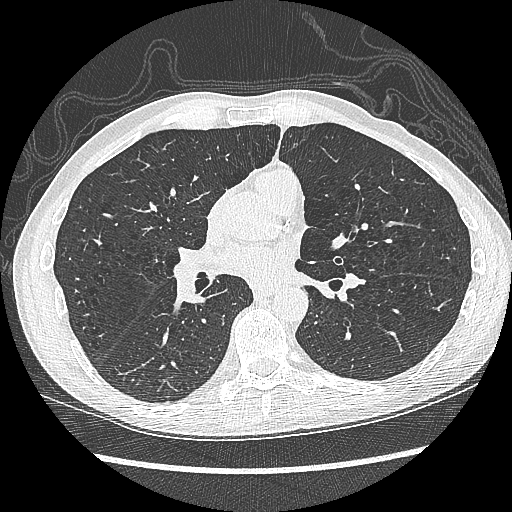
[im 181/313  mediastinal]
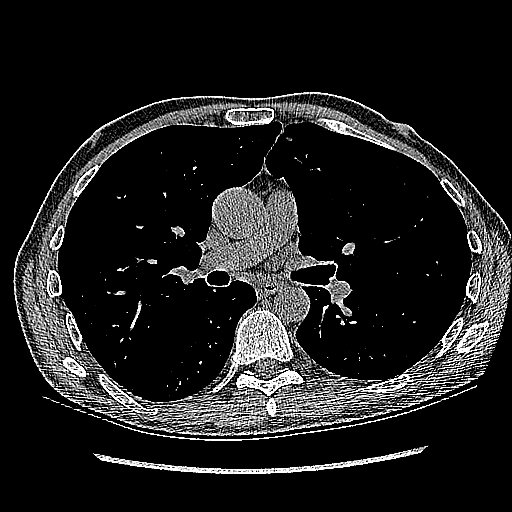
[im 181/313  lung]
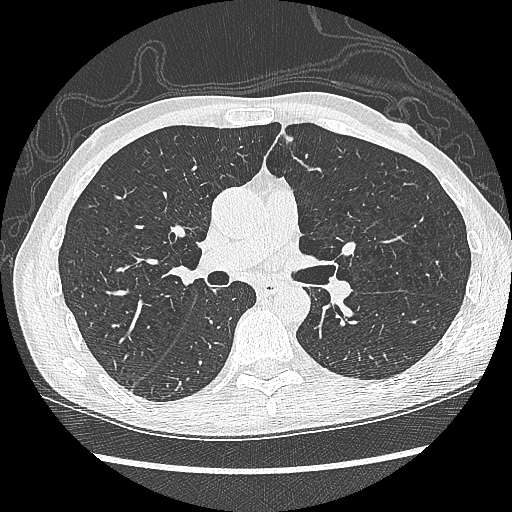
[im 198/313  lung]
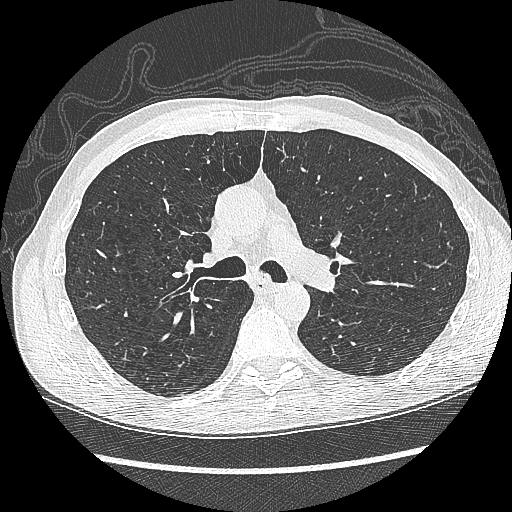
[im 214/313  lung]
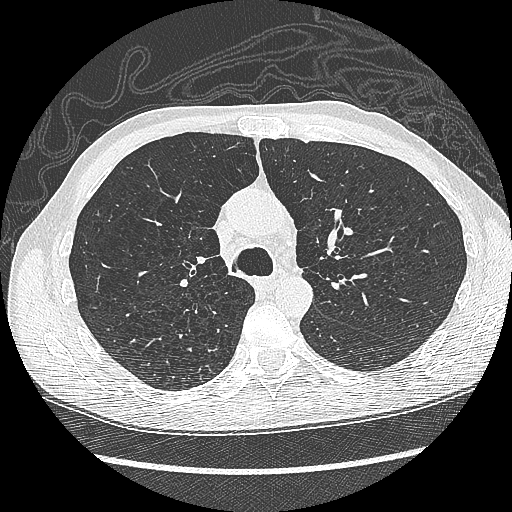
[im 230/313  lung]
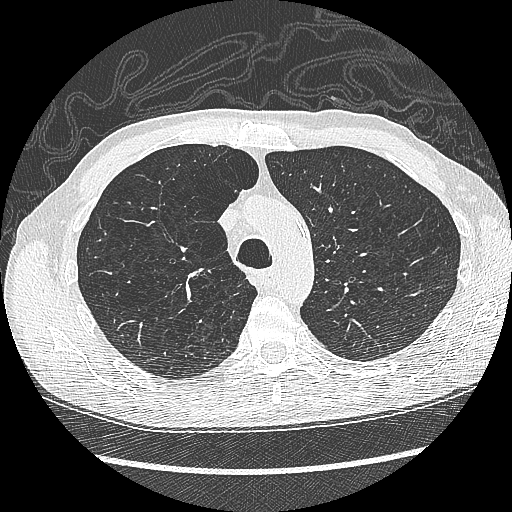
[im 263/313  mediastinal]
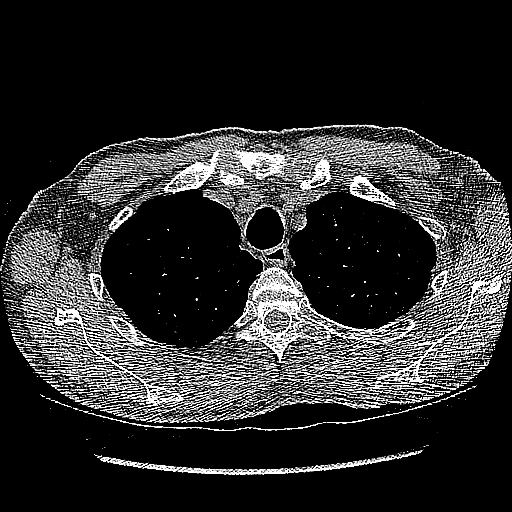
[im 263/313  lung]
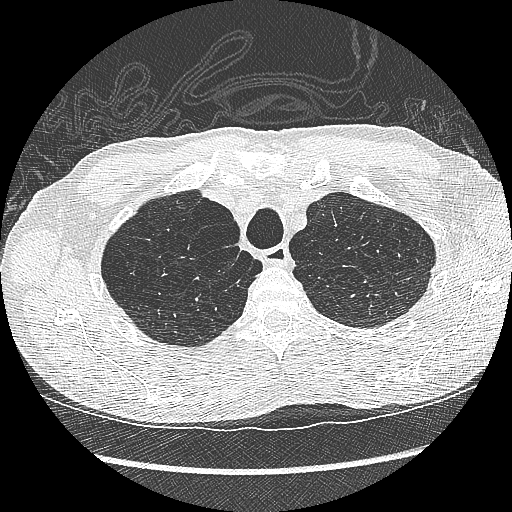
[im 280/313  lung]
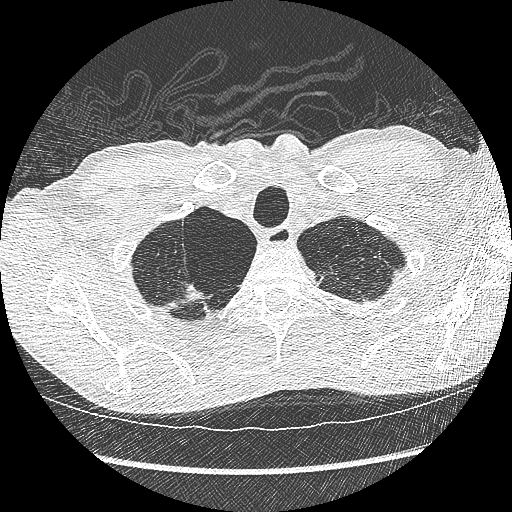
[im 296/313  lung]
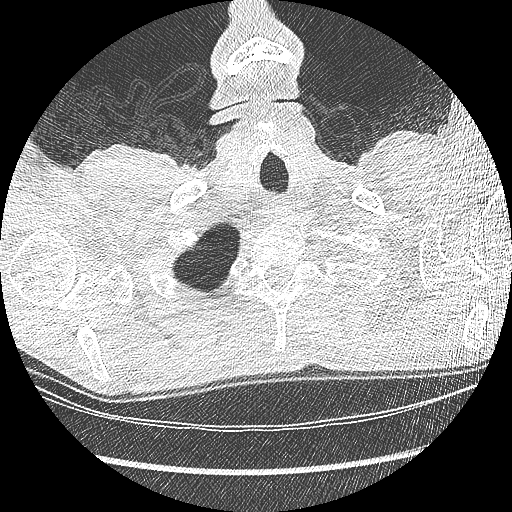

[15 of 40 positions shown; findings below may reference images not displayed]

FINDINGS: Cardiovascular: The heart size is normal. No pericardial effusion.
Coronary artery calcification is noted. Atherosclerotic
calcification is noted in the wall of the thoracic aorta.

Mediastinum/Nodes: No mediastinal lymphadenopathy. No evidence for
gross hilar lymphadenopathy although assessment is limited by the
lack of intravenous contrast on today's study. The esophagus has
normal imaging features. There is no axillary lymphadenopathy.

Lungs/Pleura: Mucus is identified in the trachea and left mainstem
bronchus. Emphysema noted with bullous change in the upper lobes.
Bronchial wall thickening noted throughout. Scattered bilateral
pulmonary nodules are evident. Dominant nodular opacity is a a
subpleural posterior right lower lobe nodule (image 140) with volume
derived mean diameter of 6.7 mm. No focal airspace consolidation. No
pulmonary edema or pleural effusion.

Upper Abdomen: 3 mm nonobstructing stone identified lower pole right
kidney.

Musculoskeletal: Bone windows reveal no worrisome lytic or sclerotic
osseous lesions.
IMPRESSION: 1. Lung-Rads category 3, probably benign findings. Dominant
posterior right lower lobe pulmonary nodule may be atelectatic.
Short-term follow-up in 6 months is recommended with repeat low-dose
chest CT without contrast (please use the following order, "CT CHEST
LCS NODULE FOLLOW-UP W/O CM").
2. Emphysema.
3. Coronary artery atherosclerosis.
4. 3 mm nonobstructing right renal stone.

## 2018-11-01 NOTE — Progress Notes (Signed)
[facility]  PATIENT: [name] DOB: January 09, 1961   REASON FOR VISIT: Follow-up for secondary Parkinson's, tremor HISTORY FROM: Patient and wife    HISTORY OF PRESENT ILLNESS: He has PMHx of HTN, hypothyroidism, on supplement, bipolar disorder, taking risperidone 4 mg daily for more than ten years, lithium 358m tid, report level was within normal limit, he is also taking alprazolam 137mtid. His psychiatrist is Dr. CuCandis SchatzHe had a lot of stress recently, his son died after struck by a truck in August 2013, his wife suffered stroke in August 2011.  He complains of bilateral hand tremor over the past few years, getting worse since 2013, fairly symmtric, if he holds anything more than 5 Lbs, for more than 10 minutes, he has right arm and hand shaking, he spills water when holding a glass of water. He denies significant gait difficulty, denies loss sense of smell. He denies family history of tremor.  Lab in July 25th 2014, showed normal CBC.   MRI of the brain done 07/19/2013 without acute findings. His tremor gets worse if he is anxious.  UPDATE Sep 9th 2015:He has gradually declining functional status, he is no longer working, sit at home most of the time, is no longer driving, he was recently evaluated by his psychiatrist Dr. CuCandis Schatzn June 2015, Lithium was decreased from 90020maily to 600 every day, he is also taking Risperdal 4 mg every day He has worsening memory trouble, constipation, restless he also reported worsening depression, to the point of suicidal thought sometimes   UPDATE Oct 26 2014: Sinemet 25/100 3 times a day since Sep 2015, has been very helpful, his tremor has much improved, he continue has mild unsteady gait, recently complains of bilateral heel pain, thick skin at left medial heel.He is still taking Risperdal 4 mg every night, Xanax 1 mg half to one tablet as needed, lithium 300 mg 2 tablets every night, Synthroid 75 g once every  day  UPDATE Oct 15 2015: His tremor is better with sinemet,  He complains of right foot pain, planning on to have surgery soon. Today his wife is also concerned about his slow worsening gait difficulty, unsteady gait, difficulty to clear his feet from the floor, worsening bilateral upper extremity weakness, bilateral deltoid muscle wasting   UPDATE January 28 2016: EMG nerve conduction study in October 30 2015 was normal, there was no evidence of large fiber peripheral neuropathy, right lumbar or right cervical radiculopathy. He is planning on to have right foot surgery for benign mass in January 30 2016  He is still on Sinemet 25/100 mg 3 times a day, which does help his tremor  UPDATE Jul 30 2016: Patient reported increased tremor, bradykinesia, memory loss since his lithium was increased from 600 mg to 900 mg every night per family this is based on laboratory evaluation, there is no significant change on patient mood,  In addition he is also taking Risperdal 4 mg once every night, trazodone 50 mg every night, thyroid supplement. He is very frustrated about his slow movement, memory loss, bilateral hands tremor, cannot even clip his fingernails  [facility] is [facility] PA [name] ([phone] April 29 2017: He is with his wife, he has worsening memory loss, "just sit there",  Watch TV, he needs help dressing, bathing, wear depends.  He drinks 3 boost a day, lack of appetite, he sleeps well.  He is now back to lithium 600m42m  daily,  He is so flat. His wife is going to retire April 30 2017 to take care of him.  This is a sudden change compared to 01/11/2012, he was a Librarian, academic of a warehouse, this a sudden change since his son died of accident in 11-Jan-2012.  He had diagnosis of bipolar disorder since 2008/01/11,   We have reviewed repeat CT chest without contrast on June 12th 2018: Stable right lung base subpleural mass,  I was able to talk with his  psychiatrist PA Jimmy Moat,, updated on the information, and also concerned about medication side effect  UPDATE Sept 11 2018:YY He is accompanied by his wife at today's clinical visit, wife is apparently very frustrated about his flat affect, lack of motivations, he needs more help in his daily activity, personal care  He is on lower dose of risperidone 72m qhs, trazodone 520mqhs,  Lithium 60073mhs, he continue to take Sinemet 25/100 mg 3 times a day, which has helped his hand tremor.  I personally reviewed MRI of the brain on May 09 2017, there is generalized atrophy mild supratentorial small vessel disease Extensive laboratory evaluation showed normal ESR, CPK, CBC showed mild elevated WBC 11 point 9, vitamin D was decreased 18 point 7, CMP showed mild elevated glucose 107 UPDATE 12/18/19CM.  [name] returns for follow-up with his wife with secondary Parkinson's disease.  He continues to require more help with his daily activities and personal care.  He lacks motivation.  He also has bipolar disorder.  He has occasional falls no injury.  Last fall was in July 2019.  He is currently on Sinemet 3 times daily which has helped his hand tremor.  MRI of the brain in May 09, 2017 with generalized atrophy mild supratentorial small vessel disease.  He returns for reevaluation REVIEW OF SYSTEMS: Full 14 system review of systems performed and notable only for those listed, all others are neg:  Constitutional: Fatigue   Cardiovascular: neg Ear/Nose/Throat: neg  Skin: neg Eyes: neg Respiratory: neg Gastroitestinal: Constipation Hematology/Lymphatic: neg  Endocrine: neg Musculoskeletal:neg Allergy/Immunology: neg Neurological: Tremors, memory loss Psychiatric: Bipolar disorder Sleep : neg   ALLERGIES: Allergies  Allergen Reactions  . Codeine Other (See Comments)    Agitation/mean   . Lamisil [Terbinafine Hcl]     Lost taste buds for 2 years    HOME  MEDICATIONS: Outpatient Medications Prior to Visit  Medication Sig Dispense Refill  . ALPRAZolam (XANAX) 1 MG tablet Take 1 mg by mouth 3 (three) times daily.    . aMarland KitchenLODipine (NORVASC) 5 MG tablet Take 5 mg by mouth daily.    . aMarland Kitchenorvastatin (LIPITOR) 20 MG tablet Take 20 mg by mouth at bedtime.    . carbidopa-levodopa (SINEMET IR) 25-100 MG tablet Take 1 tablet by mouth 3 (three) times daily. Please call [phone] schedule follow up. 270 tablet 0  . Cholecalciferol (VITAMIN D3) 1000 units CAPS Take 1,000 Units by mouth daily.    . lMarland Kitchenvothyroxine (SYNTHROID, LEVOTHROID) 75 MCG tablet Take 75 mcg by mouth daily before breakfast.    . lithium carbonate 300 MG capsule Take 600 mg by mouth at bedtime.     . risperidone (RISPERDAL) 4 MG tablet Take 4 mg by mouth at bedtime.    . traZODone (DESYREL) 50 MG tablet TAKE ONE TO TWO TABLETS BY MOUTH BY MOUTH EVERY NIGHT AT BEDTIME AS NEEDED 180 tablet 0  . gabapentin (NEURONTIN) 100 MG capsule Take 100 mg 2 (two) times daily  by mouth.    . mupirocin ointment (BACTROBAN) 2 % Place 1 application into the nose 3 (three) times daily.    . risperiDONE (RISPERDAL) 3 MG tablet Take 4 mg by mouth at bedtime.      No facility-administered medications prior to visit.     PAST MEDICAL HISTORY: Past Medical History:  Diagnosis Date  . High blood pressure   . Tremor     PAST SURGICAL HISTORY: Past Surgical History:  Procedure Laterality Date  . HEMORRHOID SURGERY     1980's  . MULTIPLE TOOTH EXTRACTIONS     2016 Has top plate    FAMILY HISTORY: Family History  Problem Relation Age of Onset  . Heart attack Mother   . Liver disease Father     SOCIAL HISTORY: Social History   Socioeconomic History  . Marital status: Married    Spouse name: [name]  . Number of children: 1  . Years of education: 70  . Highest education level: Not on file  Occupational History    Comment: Not working  Social Needs  . Financial resource strain: Not on file  .  Food insecurity:    Worry: Not on file    Inability: Not on file  . Transportation needs:    Medical: Not on file    Non-medical: Not on file  Tobacco Use  . Smoking status: Current Every Day Smoker    Packs/day: 1.00    Types: Cigarettes  . Smokeless tobacco: Never Used  . Tobacco comment: One Pack daily  Substance and Sexual Activity  . Alcohol use: No    Alcohol/week: 2.0 standard drinks    Types: 2 Cans of beer per week  . Drug use: No    Comment: Quit 2009  . Sexual activity: Not on file  Lifestyle  . Physical activity:    Days per week: Not on file    Minutes per session: Not on file  . Stress: Not on file  Relationships  . Social connections:    Talks on phone: Not on file    Gets together: Not on file    Attends religious service: Not on file    Active member of club or organization: Not on file    Attends meetings of clubs or organizations: Not on file    Relationship status: Not on file  . Intimate partner violence:    Fear of current or ex partner: Not on file    Emotionally abused: Not on file    Physically abused: Not on file    Forced sexual activity: Not on file  Other Topics Concern  . Not on file  Social History Narrative   Patient lives at home with with [name]). Patient is not working at this time. Patient has a 12th grade education.    Caffeine - four mountain dews daily.   Right handed.   Patient has one child.     PHYSICAL EXAM  Vitals:   11/02/18 1331  BP: 108/63  Pulse: 83  Weight: 159 lb (72.1 kg)  Height: 6' (1.829 m)   Body mass index is 21.56 kg/m.  Generalized: Well developed, in no acute distress, well-groomed Head: normocephalic and atraumatic,. Oropharynx benign flat expression Neck: Supple,  Musculoskeletal: No deformity   Neurological examination   Mentation: Alert  MMSE - Mini Mental State Exam 11/02/2018 07/27/2017 10/26/2014  Not completed: (No Data) - -  Orientation to time _0 Orientation to Place  5 5  4  Registration _0 Attention/ Calculation _1 Recall _2 Language- name 2 objects _3 Language- repeat _4 Language- follow 3 step command _5 Language- follow 3 step command-comments took the paper in his left hand - -  Language- read & follow direction 1 1 0  Write a sentence _6 Copy design _7 Total score _8 Follows all commands speech and language fluent.  Slow to answer questions  Cranial nerve II-XII: Pupils were equal round reactive to light extraocular movements were full, visual field were full on confrontational test.  Rare blink.  Facial sensation and strength were normal. hearing was intact to finger rubbing bilaterally. Uvula tongue midline. head turning and shoulder shrug were normal and symmetric.Tongue protrusion into cheek strength was normal. Motor: Mild to moderate bilateral upper nuchal rigidity no significant weakness.   Sensory: normal and symmetric to light touch,  Coordination: finger-nose-finger,  no dysmetria, bradykinesia Reflexes: Brachioradialis 2/2, biceps 2/2, triceps 2/2, patellar 2/2, Achilles 2/2, plantar responses were flexor bilaterally. Gait and Station: Rising up from seated position with push off, mildly unsteady gait decreased arm swing bilaterally no assistive device DIAGNOSTIC DATA (LABS, IMAGING, TESTING) - I reviewed patient records, labs, notes, testing and imaging myself where available.  Lab Results  Component Value Date   WBC 11.9 (H) 04/29/2017   HGB 14.2 04/29/2017   HCT 43.3 04/29/2017   MCV 90 04/29/2017   PLT 201 04/29/2017      Component Value Date/Time   NA 143 04/29/2017 0827   K 4.4 04/29/2017 0827   CL 103 04/29/2017 0827   CO2 26 04/29/2017 0827   GLUCOSE 107 (H) 04/29/2017 0827   BUN 10 04/29/2017 0827   CREATININE 1.00 04/29/2017 0827   CALCIUM 10.2 04/29/2017 0827   PROT 7.3 04/29/2017 0827   ALBUMIN 4.7 04/29/2017 0827   AST 12 04/29/2017 0827   ALT 14 04/29/2017 0827    ALKPHOS 116 04/29/2017 0827   BILITOT 0.5 04/29/2017 0827   GFRNONAA 83 04/29/2017 0827   GFRAA 96 04/29/2017 0827    Lab Results  Component Value Date   VITAMINB12 782 04/29/2017   Lab Results  Component Value Date   TSH 4.410 04/29/2017      ASSESSMENT AND PLAN 57 y.o. year old male  with Parkinson's features , probably due to long-term side effects of long-term psychotropic medications such as Risperdal and lithium. EMG nerve conduction study showed no evidence of large fiber peripheral neuropathy, myopathy, right cervical or lumbar radiculopath. Worsening Memory Loss likely due to polypharmacy and mood disorder  PLAN: Continue Sinemet at current dose Be careful with ambulation due to risk for falls Memory score stable Follow-up yearly and as needed Dennie Bible, [facility], [facility], APRN  [facility] [address], [place], [place] [address] [phone]

## 2018-11-02 ENCOUNTER — Ambulatory Visit: Payer: Medicare Other | Admitting: Nurse Practitioner

## 2018-11-02 ENCOUNTER — Encounter: Payer: Self-pay | Admitting: Nurse Practitioner

## 2018-11-02 VITALS — BP 108/63 | HR 83 | Ht 72.0 in | Wt 159.0 lb

## 2018-11-02 DIAGNOSIS — R269 Unspecified abnormalities of gait and mobility: Secondary | ICD-10-CM | POA: Diagnosis not present

## 2018-11-02 DIAGNOSIS — R413 Other amnesia: Secondary | ICD-10-CM

## 2018-11-02 DIAGNOSIS — G2119 Other drug induced secondary parkinsonism: Secondary | ICD-10-CM

## 2018-11-02 MED ORDER — CARBIDOPA-LEVODOPA 25-100 MG PO TABS: 1.0000 | ORAL_TABLET | Freq: Three times a day (TID) | ORAL | 3 refills | Status: DC

## 2018-11-02 NOTE — Patient Instructions (Signed)
Continue Sinemet at current dose Be careful with ambulation due to risk for falls Memory score stable Follow-up yearly and as needed

## 2018-11-03 NOTE — Progress Notes (Signed)
I have reviewed and agreed above plan. 

## 2018-11-08 ENCOUNTER — Encounter: Payer: Self-pay | Admitting: Emergency Medicine

## 2018-11-08 DIAGNOSIS — F411 Generalized anxiety disorder: Secondary | ICD-10-CM | POA: Insufficient documentation

## 2018-11-11 ENCOUNTER — Ambulatory Visit: Payer: Self-pay | Admitting: Physician Assistant

## 2018-11-21 ENCOUNTER — Other Ambulatory Visit: Payer: Self-pay | Admitting: Physician Assistant

## 2018-11-21 NOTE — Telephone Encounter (Signed)
Need to review paper chart  

## 2018-12-05 ENCOUNTER — Ambulatory Visit: Payer: Medicare Other | Admitting: Physician Assistant

## 2018-12-05 ENCOUNTER — Encounter: Payer: Self-pay | Admitting: Physician Assistant

## 2018-12-05 DIAGNOSIS — F411 Generalized anxiety disorder: Secondary | ICD-10-CM

## 2018-12-05 DIAGNOSIS — F319 Bipolar disorder, unspecified: Secondary | ICD-10-CM

## 2018-12-05 NOTE — Progress Notes (Signed)
[facility]  Patient ID: [name],  MRN: [id number]  PCP: Velna Hatchet, MD  Date of Evaluation: 12/05/2018 Time spent:15 minutes  Chief Complaint:  Chief Complaint    Follow-up      HISTORY/CURRENT STATUS: HPI  Here for routine med check. Accompanied by [name].   Patient states he is doing about the same.  He still of course dealing with the physical is a Parkinson's disease.  [name] quit her job a couple of years ago to be at home with him and that has helped him a lot mentally as well as physically.  He has reported in the past that he stays depressed all the time but did not say that today.  He did report that his son is getting married in March and he is disappointed because his son will not come to help him be fitted for the suit and things like that.  Overall he feels that his medicines are working well.  States he agrees.  She states that he sleeps a lot.  He does not do very much of anything.  He sits on the couch and watches TV.    He had labs drawn a few months ago at his PCPs office.  Patient denies increased energy with decreased need for sleep, no increased talkativeness, no racing thoughts, no impulsivity or risky behaviors, no increased spending, no increased libido, no grandiosity.  He saw the neurology nurse practitioner in December.  Apparently things are stable as far as the Parkinson's goes.  Individual Medical History/ Review of Systems: Changes? :No    Past medications for mental health diagnoses include: Lithium, Risperdal, Xanax, Synthroid, Latuda, trazodone, Depakote, Lamictal  Allergies: Codeine and Lamisil [terbinafine hcl]  Current Medications:  Current Outpatient Medications:  .  ALPRAZolam (XANAX) 1 MG tablet, Take 1 mg by mouth 3 (three) times daily., Disp: , Rfl:  .  amLODipine (NORVASC) 5 MG tablet, Take 5 mg by mouth daily., Disp: , Rfl:  .  atorvastatin (LIPITOR) 20 MG tablet, Take 20 mg by mouth at bedtime., Disp: ,  Rfl:  .  carbidopa-levodopa (SINEMET IR) 25-100 MG tablet, Take 1 tablet by mouth 3 (three) times daily. Please call [phone] to schedule follow up., Disp: 270 tablet, Rfl: 3 .  Cholecalciferol (VITAMIN D3) 1000 units CAPS, Take 1,000 Units by mouth daily., Disp: , Rfl:  .  levothyroxine (SYNTHROID, LEVOTHROID) 75 MCG tablet, Take 75 mcg by mouth daily before breakfast., Disp: , Rfl:  .  lithium carbonate 300 MG capsule, TAKE TWO CAPSULES BY MOUTH AT BEDTIME, Disp: 60 capsule, Rfl: 0 .  risperidone (RISPERDAL) 4 MG tablet, Take 4 mg by mouth at bedtime., Disp: , Rfl:  .  traZODone (DESYREL) 50 MG tablet, TAKE ONE TO TWO TABLETS BY MOUTH BY MOUTH EVERY NIGHT AT BEDTIME AS NEEDED, Disp: 180 tablet, Rfl: 0 Medication Side Effects: none  Family Medical/ Social History: Changes? No  MENTAL HEALTH EXAM:  There were no vitals taken for this visit.There is no height or weight on file to calculate BMI.  General Appearance: Casual and Well Groomed  Eye Contact:  Good  Speech:  Clear and Coherent  Volume:  Normal  Mood:  Euthymic  Affect:  Flat  Thought Process:  Goal Directed  Orientation:  Full (Time, Place, and Person)  Thought Content: Logical   Suicidal Thoughts:  No  Homicidal Thoughts:  No  Memory:  WNL  Judgement:  Good  Insight:  Good  Psychomotor Activity:  Decreased, Psychomotor Retardation and Shuffling Gait  Concentration:  Concentration: Fair and Attention Span: Fair  Recall:  Good  Fund of Knowledge: Good  Language: Good  Assets:  Desire for Improvement  ADL's:  Intact  Cognition: WNL  Prognosis:  Fair    DIAGNOSES:    ICD-10-CM   1. Bipolar I disorder ([place]) F31.9   2. Generalized anxiety disorder F41.1     Receiving Psychotherapy: No    RECOMMENDATIONS: Get labs from PCPs office.  They will sign a medical release form for [place] to obtain those records. Continue Xanax 1 mg 3 times daily as needed. Continue lithium 300 mg 2 daily. Continue Synthroid 75 mcg  daily. Continue Risperdal 4 mg daily. Continue trazodone 50 mg q. at bedtime PRN. Return in 6 months or sooner as needed.  Donnal Moat, PA-C

## 2018-12-07 ENCOUNTER — Telehealth: Payer: Self-pay | Admitting: Physician Assistant

## 2018-12-07 NOTE — Telephone Encounter (Signed)
Reviewed labs from PCP.  See phone note.

## 2018-12-08 NOTE — Telephone Encounter (Signed)
Please call with results

## 2018-12-09 NOTE — Telephone Encounter (Signed)
Called wife [name] and she verbalized understanding and will keep him on his same doses.

## 2018-12-26 ENCOUNTER — Other Ambulatory Visit: Payer: Self-pay | Admitting: Physician Assistant

## 2019-01-03 ENCOUNTER — Other Ambulatory Visit: Payer: Self-pay

## 2019-01-03 MED ORDER — ALPRAZOLAM 1 MG PO TABS: 1.0000 mg | ORAL_TABLET | Freq: Three times a day (TID) | ORAL | 5 refills | Status: DC | PRN

## 2019-01-03 NOTE — Progress Notes (Signed)
Refill request from [facility] [phone] [place] for Alprazolam 1 mg 1 tablet tid prn anxiety. Last fill 11/22/2018 #90. Next office visit 06/08/2019. Normally receives 5 additional refills. Request called into his pharmacy.

## 2019-01-09 ENCOUNTER — Other Ambulatory Visit: Payer: Self-pay | Admitting: Physician Assistant

## 2019-01-09 NOTE — Telephone Encounter (Signed)
Review paper chart

## 2019-03-17 ENCOUNTER — Other Ambulatory Visit: Payer: Self-pay | Admitting: Physician Assistant

## 2019-03-29 ENCOUNTER — Other Ambulatory Visit: Payer: Self-pay | Admitting: Physician Assistant

## 2019-04-27 ENCOUNTER — Other Ambulatory Visit (HOSPITAL_COMMUNITY): Payer: Self-pay | Admitting: Internal Medicine

## 2019-04-27 ENCOUNTER — Telehealth (HOSPITAL_COMMUNITY): Payer: Self-pay | Admitting: *Deleted

## 2019-04-27 DIAGNOSIS — L97509 Non-pressure chronic ulcer of other part of unspecified foot with unspecified severity: Secondary | ICD-10-CM

## 2019-04-27 NOTE — Telephone Encounter (Signed)
The above patient or their representative was contacted and gave the following answers to these questions:         Do you have any of the following symptoms?   Fever                    Cough                   Shortness of breath- emphysema  Do  you have any of the following other symptoms? no   muscle pain         vomiting,        diarrhea        rash         weakness        red eye        abdominal pain         bruising          bruising or bleeding              joint pain           severe headache    Have you been in contact with someone who was or has been sick in the past 2 weeks?  no  Yes                 Unsure                         Unable to assess   Does the person that you were in contact with have any of the following symptoms?   Cough         shortness of breath           muscle pain         vomiting,            diarrhea            rash            weakness           fever            red eye           abdominal pain           bruising  or  bleeding                joint pain                severe headache               Have you  or someone you have been in contact with traveled internationally in th last month?         If yes, which countries? no   Have you  or someone you have been in contact with traveled outside [place] in th last month?         If yes, which state and city? no   COMMENTS OR ACTION PLAN FOR THIS PATIENT:

## 2019-04-28 ENCOUNTER — Ambulatory Visit (HOSPITAL_COMMUNITY)
Admission: RE | Admit: 2019-04-28 | Discharge: 2019-04-28 | Disposition: A | Discharge status: Home / Self Care | Payer: Medicare Other | Source: Ambulatory Visit | Attending: Family | Admitting: Family

## 2019-04-28 ENCOUNTER — Other Ambulatory Visit: Payer: Self-pay

## 2019-04-28 DIAGNOSIS — L97509 Non-pressure chronic ulcer of other part of unspecified foot with unspecified severity: Secondary | ICD-10-CM | POA: Diagnosis not present

## 2019-04-29 ENCOUNTER — Other Ambulatory Visit: Payer: Self-pay | Admitting: Physician Assistant

## 2019-05-30 ENCOUNTER — Other Ambulatory Visit: Payer: Self-pay | Admitting: Physician Assistant

## 2019-06-01 ENCOUNTER — Other Ambulatory Visit: Payer: Self-pay

## 2019-06-01 ENCOUNTER — Encounter (HOSPITAL_BASED_OUTPATIENT_CLINIC_OR_DEPARTMENT_OTHER): Payer: Medicare Other | Attending: Internal Medicine

## 2019-06-01 ENCOUNTER — Other Ambulatory Visit: Payer: Self-pay | Admitting: Internal Medicine

## 2019-06-01 DIAGNOSIS — L97511 Non-pressure chronic ulcer of other part of right foot limited to breakdown of skin: Secondary | ICD-10-CM | POA: Diagnosis not present

## 2019-06-01 DIAGNOSIS — J449 Chronic obstructive pulmonary disease, unspecified: Secondary | ICD-10-CM | POA: Insufficient documentation

## 2019-06-01 DIAGNOSIS — I10 Essential (primary) hypertension: Secondary | ICD-10-CM | POA: Insufficient documentation

## 2019-06-01 DIAGNOSIS — F0390 Unspecified dementia without behavioral disturbance: Secondary | ICD-10-CM | POA: Diagnosis not present

## 2019-06-01 DIAGNOSIS — R911 Solitary pulmonary nodule: Secondary | ICD-10-CM

## 2019-06-01 DIAGNOSIS — G9009 Other idiopathic peripheral autonomic neuropathy: Secondary | ICD-10-CM | POA: Insufficient documentation

## 2019-06-01 DIAGNOSIS — G20 Parkinson's disease: Secondary | ICD-10-CM | POA: Insufficient documentation

## 2019-06-08 ENCOUNTER — Ambulatory Visit: Payer: Self-pay | Admitting: Physician Assistant

## 2019-06-08 DIAGNOSIS — L97511 Non-pressure chronic ulcer of other part of right foot limited to breakdown of skin: Secondary | ICD-10-CM | POA: Diagnosis not present

## 2019-06-09 ENCOUNTER — Ambulatory Visit
Admission: RE | Admit: 2019-06-09 | Discharge: 2019-06-09 | Disposition: A | Discharge status: Home / Self Care | Payer: Medicare Other | Source: Ambulatory Visit | Attending: Internal Medicine | Admitting: Internal Medicine

## 2019-06-09 ENCOUNTER — Other Ambulatory Visit: Payer: Self-pay | Admitting: Internal Medicine

## 2019-06-09 ENCOUNTER — Other Ambulatory Visit: Payer: Self-pay

## 2019-06-09 DIAGNOSIS — Z720 Tobacco use: Secondary | ICD-10-CM

## 2019-06-09 DIAGNOSIS — R911 Solitary pulmonary nodule: Secondary | ICD-10-CM

## 2019-06-14 ENCOUNTER — Other Ambulatory Visit: Payer: Self-pay | Admitting: Physician Assistant

## 2019-06-16 DIAGNOSIS — L97511 Non-pressure chronic ulcer of other part of right foot limited to breakdown of skin: Secondary | ICD-10-CM | POA: Diagnosis not present

## 2019-07-04 ENCOUNTER — Other Ambulatory Visit: Payer: Self-pay | Admitting: Physician Assistant

## 2019-07-05 NOTE — Telephone Encounter (Signed)
appt 08/31

## 2019-07-17 ENCOUNTER — Ambulatory Visit (INDEPENDENT_AMBULATORY_CARE_PROVIDER_SITE_OTHER): Payer: Medicare Other | Admitting: Physician Assistant

## 2019-07-17 ENCOUNTER — Encounter: Payer: Self-pay | Admitting: Physician Assistant

## 2019-07-17 ENCOUNTER — Other Ambulatory Visit: Payer: Self-pay

## 2019-07-17 DIAGNOSIS — F319 Bipolar disorder, unspecified: Secondary | ICD-10-CM

## 2019-07-17 DIAGNOSIS — F411 Generalized anxiety disorder: Secondary | ICD-10-CM | POA: Diagnosis not present

## 2019-07-17 NOTE — Progress Notes (Signed)
[facility]  Patient ID: [name],  MRN: [id number]  PCP: Velna Hatchet, MD   Date of Evaluation: 07/17/2019 Time spent:15 minutes  Chief Complaint:  Chief Complaint    Anxiety; Depression; Follow-up     Virtual Visit via Telephone Note  I connected with patient by a video enabled telemedicine application or telephone, with their informed consent, and verified patient privacy and that I am speaking with the correct person using two identifiers.  I am private, in my home and the patient is home.   I discussed the limitations, risks, security and privacy concerns of performing an evaluation and management service by telephone and the availability of in person appointments. I also discussed with the patient that there may be a patient responsible charge related to this service. The patient expressed understanding and agreed to proceed.   I discussed the assessment and treatment plan with the patient. The patient was provided an opportunity to ask questions and all were answered. The patient agreed with the plan and demonstrated an understanding of the instructions.   The patient was advised to call back or seek an in-person evaluation if the symptoms worsen or if the condition fails to improve as anticipated.  I provided 15 minutes of non-face-to-face time during this encounter.  HISTORY/CURRENT STATUS: HPI For routine med check.  [name] is also on the phone.  He does not really do a lot.  The Parkinson's is about the same.  He does not want to go anywhere although his wife says she does not want him out anyway due to the coronavirus pandemic threat and he is at high risk.  Basically he watches TV, naps, and gets enjoyment watching some stray cats that live near their home.  No suicidal or homicidal thoughts.  Patient denies increased energy with decreased need for sleep, no increased talkativeness, no racing thoughts, no impulsivity or risky behaviors, no  increased spending, no increased libido, no grandiosity.  They report no changes in musculoskeletal or neurologic symptoms.  Individual Medical History/ Review of Systems: Changes? :Yes  had wound on toe.  Saw wound care dr. for awhile and healed now.   Past medications for mental health diagnoses include: Lithium, Risperdal, Xanax, Synthroid, Latuda, trazodone, Depakote, Lamictal  Allergies: Codeine and Lamisil [terbinafine hcl]  Current Medications:  Current Outpatient Medications:  .  ALPRAZolam (XANAX) 1 MG tablet, TAKE ONE TABLET BY MOUTH THREE TIMES A DAY AS NEEDED FOR ANXIETY, Disp: 90 tablet, Rfl: 4 .  amLODipine (NORVASC) 5 MG tablet, Take 5 mg by mouth daily., Disp: , Rfl:  .  atorvastatin (LIPITOR) 20 MG tablet, Take 20 mg by mouth at bedtime., Disp: , Rfl:  .  carbidopa-levodopa (SINEMET IR) 25-100 MG tablet, Take 1 tablet by mouth 3 (three) times daily. Please call [phone] to schedule follow up., Disp: 270 tablet, Rfl: 3 .  Cholecalciferol (VITAMIN D3) 1000 units CAPS, Take 1,000 Units by mouth daily., Disp: , Rfl:  .  levothyroxine (SYNTHROID, LEVOTHROID) 75 MCG tablet, TAKE ONE TABLET BY MOUTH EVERY MORNING, Disp: 90 tablet, Rfl: 2 .  lithium carbonate 300 MG capsule, TAKE TWO CAPSULES BY MOUTH AT BEDTIME, Disp: 60 capsule, Rfl: 4 .  risperidone (RISPERDAL) 4 MG tablet, TAKE 1 TABLET BY MOUTH EVERY NIGHT AT BEDTIME, Disp: 60 tablet, Rfl: 4 .  traZODone (DESYREL) 50 MG tablet, TAKE 1-2 TABLETS BY MOUTH AT BEDTIME AS NEEDED (Patient taking differently: 50 mg at bedtime. ), Disp: 180 tablet, Rfl: 0 Medication  Side Effects: none  Family Medical/ Social History: Changes? No  MENTAL HEALTH EXAM:  There were no vitals taken for this visit.There is no height or weight on file to calculate BMI.  General Appearance: unable to assess  Eye Contact:  unable to assess  Speech:  Clear and Coherent  Volume:  Normal  Mood:  Euthymic  Affect:  unable to assess  Thought Process:  Goal  Directed  Orientation:  Full (Time, Place, and Person)  Thought Content: Logical   Suicidal Thoughts:  No  Homicidal Thoughts:  No  Memory:  WNL  Judgement:  Good  Insight:  Good  Psychomotor Activity:  unable to assess  Concentration:  Concentration: Good  Recall:  Good  Fund of Knowledge: Good  Language: Good  Assets:  Desire for Improvement  ADL's:  Intact  Cognition: WNL  Prognosis:  Good  Last labs were 09/28/2018 per his PCP, Dr. Ardeth Perfect.  Lithium was 0.6, BUN and creatinine were normal at 14/1.05, and TSH was 2.1.  DIAGNOSES:    ICD-10-CM   1. Bipolar I disorder ([place])  F31.9   2. Generalized anxiety disorder  F41.1     Receiving Psychotherapy: No    RECOMMENDATIONS:  Continue Xanax 1 mg 3 times daily as needed. Continue lithium 300 mg, 2 p.o. nightly. Continue Risperdal 4 mg nightly. Continue trazodone 50 mg nightly as needed. Return in 6 months.  Donnal Moat, PA-C   This record has been created using [employer].  Chart creation errors have been sought, but may not always have been located and corrected. Such creation errors do not reflect on the standard of medical care.

## 2019-09-05 ENCOUNTER — Other Ambulatory Visit: Payer: Self-pay | Admitting: Physician Assistant

## 2019-10-10 ENCOUNTER — Emergency Department (HOSPITAL_COMMUNITY)
Admission: EM | Admit: 2019-10-10 | Discharge: 2019-10-10 | Disposition: A | Discharge status: Home / Self Care | Payer: Medicare Other | Attending: Emergency Medicine | Admitting: Emergency Medicine

## 2019-10-10 ENCOUNTER — Encounter (HOSPITAL_COMMUNITY): Payer: Self-pay

## 2019-10-10 ENCOUNTER — Other Ambulatory Visit: Payer: Self-pay

## 2019-10-10 DIAGNOSIS — G20 Parkinson's disease: Secondary | ICD-10-CM | POA: Insufficient documentation

## 2019-10-10 DIAGNOSIS — Z79899 Other long term (current) drug therapy: Secondary | ICD-10-CM | POA: Insufficient documentation

## 2019-10-10 DIAGNOSIS — F17210 Nicotine dependence, cigarettes, uncomplicated: Secondary | ICD-10-CM | POA: Insufficient documentation

## 2019-10-10 DIAGNOSIS — F319 Bipolar disorder, unspecified: Secondary | ICD-10-CM | POA: Insufficient documentation

## 2019-10-10 DIAGNOSIS — E079 Disorder of thyroid, unspecified: Secondary | ICD-10-CM | POA: Diagnosis not present

## 2019-10-10 DIAGNOSIS — R197 Diarrhea, unspecified: Secondary | ICD-10-CM | POA: Diagnosis not present

## 2019-10-10 DIAGNOSIS — I10 Essential (primary) hypertension: Secondary | ICD-10-CM | POA: Insufficient documentation

## 2019-10-10 HISTORY — DX: Disorder of thyroid, unspecified: E07.9

## 2019-10-10 HISTORY — DX: Bipolar disorder, unspecified: F31.9

## 2019-10-10 HISTORY — DX: Anxiety disorder, unspecified: F41.9

## 2019-10-10 HISTORY — DX: Polyneuropathy, unspecified: G62.9

## 2019-10-10 HISTORY — DX: Parkinson's disease without dyskinesia, without mention of fluctuations: G20.A1

## 2019-10-10 HISTORY — DX: Pure hypercholesterolemia, unspecified: E78.00

## 2019-10-10 HISTORY — DX: Parkinson's disease: G20

## 2019-10-10 HISTORY — DX: Depression, unspecified: F32.A

## 2019-10-10 LAB — CBC
HCT: 38.5 % — ABNORMAL LOW (ref 39.0–52.0)
Hemoglobin: 11.7 g/dL — ABNORMAL LOW (ref 13.0–17.0)
MCH: 25.5 pg — ABNORMAL LOW (ref 26.0–34.0)
MCHC: 30.4 g/dL (ref 30.0–36.0)
MCV: 84.1 fL (ref 80.0–100.0)
Platelets: 202 10*3/uL (ref 150–400)
RBC: 4.58 MIL/uL (ref 4.22–5.81)
RDW: 20.2 % — ABNORMAL HIGH (ref 11.5–15.5)
WBC: 12.2 10*3/uL — ABNORMAL HIGH (ref 4.0–10.5)
nRBC: 0 % (ref 0.0–0.2)

## 2019-10-10 LAB — BASIC METABOLIC PANEL
Anion gap: 13 (ref 5–15)
BUN: 9 mg/dL (ref 6–20)
CO2: 24 mmol/L (ref 22–32)
Calcium: 9.2 mg/dL (ref 8.9–10.3)
Chloride: 104 mmol/L (ref 98–111)
Creatinine, Ser: 1.03 mg/dL (ref 0.61–1.24)
GFR calc Af Amer: >60 mL/min (ref >60)
GFR calc non Af Amer: >60 mL/min (ref >60)
Glucose, Bld: 98 mg/dL (ref 70–99)
Potassium: 3.5 mmol/L (ref 3.5–5.1)
Sodium: 141 mmol/L (ref 135–145)

## 2019-10-10 LAB — URINALYSIS, ROUTINE W REFLEX MICROSCOPIC
Bilirubin Urine: NEGATIVE
Glucose, UA: NEGATIVE mg/dL
Hgb urine dipstick: NEGATIVE
Ketones, ur: NEGATIVE mg/dL
Leukocytes,Ua: NEGATIVE
Nitrite: NEGATIVE
Protein, ur: NEGATIVE mg/dL
Specific Gravity, Urine: 1.009 (ref 1.005–1.030)
pH: 7 (ref 5.0–8.0)

## 2019-10-10 LAB — CBG MONITORING, ED: Glucose-Capillary: 98 mg/dL (ref 70–99)

## 2019-10-10 MED ORDER — CARBIDOPA-LEVODOPA 25-100 MG PO TABS: 1.0000 | ORAL_TABLET | Freq: Three times a day (TID) | ORAL | Status: DC

## 2019-10-10 MED ORDER — SODIUM CHLORIDE 0.9 % IV BOLUS
1000.0000 mL | Freq: Once | INTRAVENOUS | Status: AC
  Administered 2019-10-10: 14:00:00 1000 mL via INTRAVENOUS

## 2019-10-10 MED ORDER — CARBIDOPA-LEVODOPA 25-100 MG PO TABS
1.0000 | ORAL_TABLET | Freq: Once | ORAL | Status: AC
  Administered 2019-10-10: 15:00:00 1 via ORAL
  Filled 2019-10-10: qty 1

## 2019-10-10 NOTE — Discharge Instructions (Signed)
Drink plenty of fluids and get plenty of rest.  I have attached information about dietary modification to help reduce diarrhea.  Can continue with Imodium as needed for diarrhea.  Continue with home medications as prescribed.  Call PCP tomorrow to schedule follow-up for reevaluation.  Return to the emergency department if any concerning signs or symptoms develop such as persistent vomiting, high fevers, loss of consciousness, or blood in stools.

## 2019-10-10 NOTE — ED Provider Notes (Signed)
[facility] EMERGENCY DEPARTMENT Provider Note   CSN: [id number] Arrival date & time: 10/10/19  1137     History   Chief Complaint Chief Complaint  Patient presents with  . Dehydration    HPI Destined Gariepy is a 58 y.o. male with history of bipolar 1 disorder, anxiety, depression, hypertension, hyperlipidemia, Parkinson's disease, peripheral neuropathy presenting for evaluation of acute onset, persistent diarrhea for the last 2 weeks.  Wife provides most of the history, reports that he will have loose stools after every meal.  Has had 2-3 episodes of loose stools which are nonbloody.  Denies nausea, vomiting, abdominal pain, chest pain, shortness of breath, fever, chills, or cough.  No known sick contacts.  Has tried Imodium with some relief.  Went to his PCP yesterday who did blood work.  Patient's wife called back to the office today and was told to seek evaluation in the ED for treatment of dehydration.  No recent travel, no recent treatment with antibiotics.     The history is provided by the patient and the spouse.    Past Medical History:  Diagnosis Date  . Anxiety   . Bipolar 1 disorder ([place])   . Depression   . High blood pressure   . High cholesterol   . Parkinson's disease ([place])   . Peripheral neuropathy   . Thyroid disease   . Tremor     Patient Active Problem List   Diagnosis Date Noted  . GAD (generalized anxiety disorder) 11/08/2018  . Memory loss 04/29/2017  . Bipolar I disorder ([place]) 07/30/2016  . Weakness 10/15/2015  . Parkinsonism ([place]) 10/26/2014  . Abnormality of gait 07/25/2014  . High blood pressure   . Tremor     Past Surgical History:  Procedure Laterality Date  . HEMORRHOID SURGERY     1980's  . MULTIPLE TOOTH EXTRACTIONS     2016 Has top plate        Home Medications    Prior to Admission medications   Medication Sig Start Date End Date Taking? Authorizing Provider  ALPRAZolam Duanne Moron) 1 MG tablet TAKE ONE TABLET  BY MOUTH THREE TIMES A DAY AS NEEDED FOR ANXIETY 07/05/19   Hurst, Helene Kelp T, PA-C  amLODipine (NORVASC) 5 MG tablet Take 5 mg by mouth daily.    [provider]  atorvastatin (LIPITOR) 20 MG tablet Take 20 mg by mouth at bedtime.    [provider]  carbidopa-levodopa (SINEMET IR) 25-100 MG tablet Take 1 tablet by mouth 3 (three) times daily. Please call [phone] to schedule follow up. 11/02/18   Dennie Bible, NP  Cholecalciferol (VITAMIN D3) 1000 units CAPS Take 1,000 Units by mouth daily.    [provider]  levothyroxine (SYNTHROID) 75 MCG tablet TAKE ONE TABLET BY MOUTH EVERY MORNING 09/05/19   Donnal Moat T, PA-C  lithium carbonate 300 MG capsule TAKE TWO CAPSULES BY MOUTH AT BEDTIME 03/30/19   Hurst, Helene Kelp T, PA-C  risperidone (RISPERDAL) 4 MG tablet TAKE 1 TABLET BY MOUTH EVERY NIGHT AT BEDTIME 05/01/19   Hurst, Helene Kelp T, PA-C  traZODone (DESYREL) 50 MG tablet TAKE 1-2 TABLETS BY MOUTH AT BEDTIME AS NEEDED Patient taking differently: 50 mg at bedtime.  06/15/19   Addison Lank, PA-C    Family History Family History  Problem Relation Age of Onset  . Heart attack Mother   . Liver disease Father     Social History Social History   Tobacco Use  . Smoking status:  Current Some Day Smoker    Packs/day: 0.10    Types: Cigarettes  . Smokeless tobacco: Never Used  . Tobacco comment: only occasionally smokes  Substance Use Topics  . Alcohol use: No  . Drug use: No    Comment: Quit 2009     Allergies   Codeine and Lamisil [terbinafine hcl]   Review of Systems Review of Systems  Constitutional: Negative for chills and fever.  Respiratory: Negative for shortness of breath.   Cardiovascular: Negative for chest pain.  Gastrointestinal: Positive for diarrhea. Negative for abdominal pain, constipation, nausea and vomiting.  All other systems reviewed and are negative.    Physical Exam Updated Vital Signs BP 111/73   Pulse 69   Temp 98.9  F (37.2 C) (Oral)   Resp (!) 21   Ht 6' (1.829 m)   Wt 63 kg   SpO2 96%   BMI 18.85 kg/m   Physical Exam Vitals signs and nursing note reviewed.  Constitutional:      General: He is not in acute distress.    Appearance: He is well-developed.     Comments: Resting comfortably in bed  HENT:     Head: Normocephalic and atraumatic.     Mouth/Throat:     Mouth: Mucous membranes are moist.  Eyes:     General:        Right eye: No discharge.        Left eye: No discharge.     Conjunctiva/sclera: Conjunctivae normal.  Neck:     Musculoskeletal: Normal range of motion and neck supple.     Vascular: No JVD.     Trachea: No tracheal deviation.  Cardiovascular:     Rate and Rhythm: Normal rate and regular rhythm.     Pulses: Normal pulses.     Heart sounds: Normal heart sounds.  Pulmonary:     Effort: Pulmonary effort is normal.     Breath sounds: Normal breath sounds.  Abdominal:     General: Abdomen is flat. Bowel sounds are normal. There is no distension.     Palpations: Abdomen is soft.     Tenderness: There is no abdominal tenderness. There is no guarding or rebound.  Skin:    General: Skin is warm and dry.     Findings: No erythema.     Comments: Mildly decreased skin turgor  Neurological:     Mental Status: He is alert.  Psychiatric:        Behavior: Behavior normal.     Comments: Flat affect      ED Treatments / Results  Labs (all labs ordered are listed, but only abnormal results are displayed) Labs Reviewed  CBC - Abnormal; Notable for the following components:      Result Value   WBC 12.2 (*)    Hemoglobin 11.7 (*)    HCT 38.5 (*)    MCH 25.5 (*)    RDW 20.2 (*)    All other components within normal limits  BASIC METABOLIC PANEL  URINALYSIS, ROUTINE W REFLEX MICROSCOPIC  CBG MONITORING, ED    EKG None  Radiology No results found.  Procedures Procedures (including critical care time)  Medications Ordered in ED Medications  sodium chloride  0.9 % bolus 1,000 mL (0 mLs Intravenous Stopped 10/10/19 1500)  carbidopa-levodopa (SINEMET IR) 25-100 MG per tablet immediate release 1 tablet (1 tablet Oral Given 10/10/19 1520)     Initial Impression / Assessment and Plan / ED Course  I have reviewed  the triage vital signs and the nursing notes.  Pertinent labs & imaging results that were available during my care of the patient were reviewed by me and considered in my medical decision making (see chart for details).        Patient presents for evaluation of diarrhea for 2 weeks.  He is afebrile, vital signs are stable.  He is nontoxic in appearance.  Examination of the abdomen is benign with no tenderness or peritoneal signs.  Lab work reviewed by me shows mild nonspecific leukocytosis, mild anemia, no metabolic derangements, no renal insufficiency.  UA does not suggest UTI or nephrolithiasis.  He is not orthostatic.  He was given a liter of IV fluids in the ED.  Doubt acute surgical abdominal pathology given reassuring physical examination and blood work.  He was unable to give [place] a stool sample in the ED.  Discussed dietary modification, management with Imodium and close PCP follow-up.  Discussed strict ED return precautions.  Patient and wife verbalized understanding of and agreement with plan and patient stable for discharge home at this time.   Final Clinical Impressions(s) / ED Diagnoses   Final diagnoses:  Diarrhea, unspecified type    ED Discharge Orders    None       Renita Papa, PA-C 10/10/19 [place], Adam, DO 10/10/19 1550

## 2019-10-10 NOTE — ED Triage Notes (Signed)
Pt reports diarrhea and no appetite for the past 2 weeks, pt sent here from PCP office for treatment of dehydration. Pt alert. Denies pain, n/v.

## 2019-10-24 ENCOUNTER — Other Ambulatory Visit: Payer: Self-pay | Admitting: Physician Assistant

## 2019-10-25 ENCOUNTER — Other Ambulatory Visit: Payer: Self-pay | Admitting: Physician Assistant

## 2019-11-21 NOTE — Progress Notes (Signed)
PATIENT: [name] DOB: 05-22-1961  REASON FOR VISIT: follow up HISTORY FROM: patient  HISTORY OF PRESENT ILLNESS: Today 11/22/19  HISTORY  HISTORY OF PRESENT ILLNESS: He has PMHx of HTN, hypothyroidism, on supplement, bipolar disorder, taking risperidone 4 mg daily for more than ten years, lithium 331m tid, report level was within normal limit, he is also taking alprazolam 176mtid. His psychiatrist is Dr. CuCandis SchatzHe had a lot of stress recently, his son died after struck by a truck in August 2013, his wife suffered stroke in August 2011.  He complains of bilateral hand tremor over the past few years, getting worse since 2013, fairly symmtric, if he holds anything more than 5 Lbs, for more than 10 minutes, he has right arm and hand shaking, he spills water when holding a glass of water. He denies significant gait difficulty, denies loss sense of smell. He denies family history of tremor.  Lab in July 25th 2014, showed normal CBC.   MRI of the brain done 07/19/2013 without acute findings. His tremor gets worse if he is anxious.  UPDATE Sep 9th 2015:He has gradually declining functional status, he is no longer working, sit at home most of the time, is no longer driving, he was recently evaluated by his psychiatrist Dr. CuCandis Schatzn June 2015, Lithium was decreased from 9003maily to 600 every day, he is also taking Risperdal 4 mg every day He has worsening memory trouble, constipation, restless he also reported worsening depression, to the point of suicidal thought sometimes   UPDATE Oct 26 2014: Sinemet 25/100 3 times a day since Sep 2015, has been very helpful, his tremor has much improved, he continue has mild unsteady gait, recently complains of bilateral heel pain, thick skin at left medial heel.He is still taking Risperdal 4 mg every night, Xanax 1 mg half to one tablet as needed, lithium 300 mg 2 tablets every night, Synthroid 75 g once every day  UPDATE Oct 15 2015: His tremor is better with sinemet, He complains of right foot pain, planning on to have surgery soon. Today his wife is also concerned about his slow worsening gait difficulty, unsteady gait, difficulty to clear his feet from the floor, worsening bilateral upper extremity weakness, bilateral deltoid muscle wasting   UPDATE January 28 2016: EMG nerve conduction study in October 30 2015 was normal, there was no evidence of large fiber peripheral neuropathy, right lumbar or right cervical radiculopathy. He is planning on to have right foot surgery for benign mass in January 30 2016  He is still on Sinemet 25/100 mg 3 times a day, which does help his tremor  UPDATE Jul 30 2016: Patient reported increased tremor, bradykinesia, memory loss since his lithium was increased from 600 mg to 900 mg every night per family this is based on laboratory evaluation, there is no significant change on patient mood,  In addition he is also taking Risperdal 4 mg once every night, trazodone 50 mg every night, thyroid supplement. He is very frustrated about his slow movement, memory loss, bilateral hands tremor, cannot even clip his fingernails  He is under [facility] Jimmy [name] ([phone] April 29 2017: He is with his wife, he has worsening memory loss, "just sit there", Watch TV, he needs help dressing, bathing, wear depends. He drinks 3 boost a day, lack of appetite, he sleeps well.  He is now back to lithium 600m61mily, He is so flat.  His wife is going to retire April 30 2017 to take care of him.  This is a sudden change compared to 2012/01/06, he was a Librarian, academic of a warehouse, this a sudden change since his son died of accident in 01/06/2012. He had diagnosis of bipolar disorder since Jan 06, 2008,   We have reviewed repeat CT chest without contrast on June 12th 2018: Stable right lung base subpleural mass,  I was able to talk with his psychiatrist Jimmy Jimmy Moat,, updated on the information, and also concerned about medication side effect  UPDATE Sept 11 2018:YY He is accompanied by his wife at today's clinical visit, wife is apparently very frustrated about his flat affect, lack of motivations,he needs more help inhis daily activity, personal care  He is on lower dose of risperidone 36m qhs, trazodone 530mqhs, Lithium 60075mhs, he continue to take Sinemet 25/100 mg 3 times a day, which has helped his hand tremor.  I personally reviewed MRI of the brain on May 09 2017, there is generalized atrophy mild supratentorial small vessel disease Extensive laboratory evaluation showed normal ESR, CPK, CBC showed mild elevated WBC 11 point 9, vitamin D was decreased 18 point 7, CMP showed mild elevated glucose 107 UPDATE 12/18/19CM.  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  He continues to require more help with his daily activities and personal care.  He lacks motivation.  He also has bipolar disorder.  He has occasional falls no injury.  Last fall was in July 2019.  He is currently on Sinemet 3 times daily which has helped his hand tremor.  MRI of the brain in May 09, 2017 with generalized atrophy mild supratentorial small vessel disease.  He returns for reevaluation  Update 11/22/2019 SS: [name] a 58 11ar old male with history of secondary Parkinson's and bipolar disorder.  Parkinson features, probably due to long-term side effects of long-term psychotropic medications such as Risperdal and lithium.  Previous EMG nerve conduction study showed no evidence of large fiber peripheral neuropathy, myopathy, right cervical or lumbar radiculopathy.  He remains on Sinemet 3 times daily, which has been helpful for his hand tremor.  His wife reports increase in drooling, gait instability, and falling.  He requires much more assistance with his ADLs.  He has complained of his feet hurting, he is now  taking gabapentin.  She reports he does not have much stamina, he stays depressed.  He sleeps well.  In the last year he has lost 20 pounds, he is now taking boost.  He is using a rolling walker at times, but not always.  He presents today for follow-up accompanied by his wife.  REVIEW OF SYSTEMS: Out of a complete 14 system review of symptoms, the patient complains only of the following symptoms, and all other reviewed systems are negative.  Gait instability, falls  ALLERGIES: Allergies  Allergen Reactions  . Codeine Other (See Comments)    Agitation/mean   . Lamisil [Terbinafine Hcl]     Lost taste buds for 2 years    HOME MEDICATIONS: Outpatient Medications Prior to Visit  Medication Sig Dispense Refill  . ALPRAZolam (XANAX) 1 MG tablet TAKE ONE TABLET BY MOUTH THREE TIMES A DAY AS NEEDED FOR ANXIETY (Patient taking differently: Taking 2x daily. Will take the 3rd dose if needed.) 90 tablet 4  . amLODipine (NORVASC) 5 MG tablet Take 5 mg by mouth daily.    . aMarland Kitchenorvastatin (LIPITOR) 20 MG tablet Take  20 mg by mouth at bedtime.    . carbidopa-levodopa (SINEMET IR) 25-100 MG tablet Take 1 tablet by mouth 3 (three) times daily. Please call [phone] to schedule follow up. 270 tablet 3  . Cholecalciferol (VITAMIN D3) 1000 units CAPS Take 1,000 Units by mouth daily.    Marland Kitchen gabapentin (NEURONTIN) 100 MG capsule     . levothyroxine (SYNTHROID) 75 MCG tablet TAKE ONE TABLET BY MOUTH EVERY MORNING 90 tablet 1  . lithium carbonate 300 MG capsule TAKE TWO CAPSULES BY MOUTH AT BEDTIME 60 capsule 4  . risperidone (RISPERDAL) 4 MG tablet TAKE 1 TABLET BY MOUTH EVERY NIGHT AT BEDTIME 60 tablet 4  . traZODone (DESYREL) 50 MG tablet TAKE 1-2 TABLETS BY MOUTH AT BEDTIME AS NEEDED (Patient taking differently: 50 mg at bedtime. ) 180 tablet 0   No facility-administered medications prior to visit.    PAST MEDICAL HISTORY: Past Medical History:  Diagnosis Date  . Anxiety   . Bipolar 1 disorder ([place])   .  Depression   . High blood pressure   . High cholesterol   . Parkinson's disease ([place])   . Peripheral neuropathy   . Thyroid disease   . Tremor     PAST SURGICAL HISTORY: Past Surgical History:  Procedure Laterality Date  . HEMORRHOID SURGERY     1980's  . MULTIPLE TOOTH EXTRACTIONS     2016 Has top plate    FAMILY HISTORY: Family History  Problem Relation Age of Onset  . Heart attack Mother   . Liver disease Father     SOCIAL HISTORY: Social History   Socioeconomic History  . Marital status: Married    Spouse name: [name]  . Number of children: 1  . Years of education: 41  . Highest education level: Not on file  Occupational History    Comment: Not working  Tobacco Use  . Smoking status: Current Some Day Smoker    Packs/day: 0.10    Types: Cigarettes  . Smokeless tobacco: Never Used  . Tobacco comment: only occasionally smokes  Substance and Sexual Activity  . Alcohol use: No  . Drug use: No    Comment: Quit 2009  . Sexual activity: Not on file  Other Topics Concern  . Not on file  Social History Narrative   Patient lives at home with with his wife [name]). Patient is not working at this time. Patient has a 12th grade education.    Caffeine - four mountain dews daily.   Right handed.   Patient has one child.   Social Determinants of Health   Financial Resource Strain:   . Difficulty of Paying Living Expenses: Not on file  Food Insecurity:   . Worried About Charity fundraiser in the Last Year: Not on file  . Ran Out of Food in the Last Year: Not on file  Transportation Needs:   . Lack of Transportation (Medical): Not on file  . Lack of Transportation (Non-Medical): Not on file  Physical Activity:   . Days of Exercise per Week: Not on file  . Minutes of Exercise per Session: Not on file  Stress:   . Feeling of Stress : Not on file  Social Connections:   . Frequency of Communication with Friends and Family: Not on file  . Frequency of Social  Gatherings with Friends and Family: Not on file  . Attends Religious Services: Not on file  . Active Member of Clubs or Organizations: Not on file  .  Attends Archivist Meetings: Not on file  . Marital Status: Not on file  Intimate Partner Violence:   . Fear of Current or Ex-Partner: Not on file  . Emotionally Abused: Not on file  . Physically Abused: Not on file  . Sexually Abused: Not on file    PHYSICAL EXAM  Vitals:   11/22/19 1415  BP: 121/81  Pulse: 87  Temp: (!) 97.5 F (36.4 C)  Weight: 138 lb 3.2 oz (62.7 kg)  Height: 6' (1.829 m)   Body mass index is 18.74 kg/m.  Generalized: Well developed, in no acute distress  MMSE - Mini Mental State Exam 11/22/2019 11/02/2018 07/27/2017  Not completed: - (No Data) -  Orientation to time _0 Orientation to Place _1 Registration _2 Attention/ Calculation _3 Recall _4 Language- name 2 objects _5 Language- repeat _6 Language- follow 3 step command _7 Language- follow 3 step command-comments - took the paper in his left hand -  Language- read & follow direction _8 Write a sentence _9 Copy design _10 Total score _11 Neurological examination  Mentation: Alert oriented, has a history is provided by his wife. Follows all commands speech somewhat slowed, flat affect Cranial nerve II-XII: Pupils were equal round reactive to light. Extraocular movements were full, visual field were full on confrontational test. Facial sensation and strength were normal. Head turning and shoulder shrug  were normal and symmetric. Motor: Good strength of all extremities, bradykinesia noted Sensory: Sensory testing is intact to soft touch on all 4 extremities. No evidence of extinction is noted.  Coordination: Cerebellar testing reveals good finger-nose-finger and heel-to-shin bilaterally.  Mild to moderate impaired toe taps bilaterally Gait and station: Able to rise from seated position  without pushoff, mildly unsteady gait, wide-based, decreased arm swing bilaterally, no assistive device Reflexes: Deep tendon reflexes are symmetric.   DIAGNOSTIC DATA (LABS, IMAGING, TESTING) - I reviewed patient records, labs, notes, testing and imaging myself where available.  Lab Results  Component Value Date   WBC 12.2 (H) 10/10/2019   HGB 11.7 (L) 10/10/2019   HCT 38.5 (L) 10/10/2019   MCV 84.1 10/10/2019   PLT 202 10/10/2019      Component Value Date/Time   NA 141 10/10/2019 1159   NA 143 04/29/2017 0827   K 3.5 10/10/2019 1159   CL 104 10/10/2019 1159   CO2 24 10/10/2019 1159   GLUCOSE 98 10/10/2019 1159   BUN 9 10/10/2019 1159   BUN 10 04/29/2017 0827   CREATININE 1.03 10/10/2019 1159   CALCIUM 9.2 10/10/2019 1159   PROT 7.3 04/29/2017 0827   ALBUMIN 4.7 04/29/2017 0827   AST 12 04/29/2017 0827   ALT 14 04/29/2017 0827   ALKPHOS 116 04/29/2017 0827   BILITOT 0.5 04/29/2017 0827   GFRNONAA >60 10/10/2019 1159   GFRAA >60 10/10/2019 1159   No results found for: CHOL, HDL, LDLCALC, LDLDIRECT, TRIG, CHOLHDL No results found for: HGBA1C Lab Results  Component Value Date   VITAMINB12 782 04/29/2017   Lab Results  Component Value Date   TSH 4.410 04/29/2017      ASSESSMENT AND PLAN 59 y.o. year old male  has a past medical history of Anxiety, Bipolar 1 disorder ([place]), Depression, High blood pressure, High cholesterol, Parkinson's disease ([place]), Peripheral neuropathy,  Thyroid disease, and Tremor. here with:  1.  Parkinsonism, likely due to long-term side effects of long-term psychotropic medications, risperidone and lithium. -He reports increase in drooling, and gait instability, falls, low stamina -He has previously responded very well to Sinemet, will increase Sinemet 25/100 mg 1.5 tablets 3 times a day -We discussed PT/OT, if no improvement we can consider a course of therapy, his wife agrees, gait training may be helpful for his condition -We may consider  getting DaTscan in the future to determine if true Parkinson's disease -He will follow-up in 6 months with Dr. Krista Blue   2.  Worsening memory loss, likely due to polypharmacy, mood disorder -Memory score appears stable   I spent 25 minutes with the patient. 50% of this time was spent discussing his plan of care.  Butler Denmark, AGNP-C, DNP 11/22/2019, 2:23 PM [facility] [address] [place], [place] [address] [phone]

## 2019-11-22 ENCOUNTER — Ambulatory Visit: Payer: Medicare Other | Admitting: Neurology

## 2019-11-22 ENCOUNTER — Encounter: Payer: Self-pay | Admitting: Neurology

## 2019-11-22 ENCOUNTER — Other Ambulatory Visit: Payer: Self-pay

## 2019-11-22 VITALS — BP 121/81 | HR 87 | Temp 97.5°F | Ht 72.0 in | Wt 138.2 lb

## 2019-11-22 DIAGNOSIS — G2119 Other drug induced secondary parkinsonism: Secondary | ICD-10-CM

## 2019-11-22 DIAGNOSIS — R413 Other amnesia: Secondary | ICD-10-CM | POA: Diagnosis not present

## 2019-11-22 MED ORDER — CARBIDOPA-LEVODOPA 25-100 MG PO TABS: 1.5000 | ORAL_TABLET | Freq: Three times a day (TID) | ORAL | 3 refills | Status: DC

## 2019-11-23 ENCOUNTER — Other Ambulatory Visit: Payer: Self-pay | Admitting: Physician Assistant

## 2019-12-26 NOTE — Progress Notes (Signed)
I have reviewed and agreed above plan. 

## 2020-01-12 ENCOUNTER — Encounter: Payer: Self-pay | Admitting: Physician Assistant

## 2020-01-12 ENCOUNTER — Ambulatory Visit (INDEPENDENT_AMBULATORY_CARE_PROVIDER_SITE_OTHER): Payer: Medicare Other | Admitting: Physician Assistant

## 2020-01-12 DIAGNOSIS — F319 Bipolar disorder, unspecified: Secondary | ICD-10-CM | POA: Diagnosis not present

## 2020-01-12 DIAGNOSIS — G4700 Insomnia, unspecified: Secondary | ICD-10-CM | POA: Diagnosis not present

## 2020-01-12 DIAGNOSIS — F411 Generalized anxiety disorder: Secondary | ICD-10-CM | POA: Diagnosis not present

## 2020-01-12 DIAGNOSIS — G2119 Other drug induced secondary parkinsonism: Secondary | ICD-10-CM

## 2020-01-12 NOTE — Progress Notes (Signed)
[facility]  Patient ID: [name],  MRN: [id number]  PCP: Jimmy Hatchet, MD  Date of Evaluation: 01/12/2020 Time spent:20 minutes  Chief Complaint:  Chief Complaint    Medication Refill     Virtual Visit via Telephone Note  I connected with patient by a video enabled telemedicine application or telephone, with their informed consent, and verified patient privacy and that I am speaking with the correct person using two identifiers.  I am private, in my office and the patient is at home.   I discussed the limitations, risks, security and privacy concerns of performing an evaluation and management service by telephone and the availability of in person appointments. I also discussed with the patient that there may be a patient responsible charge related to this service. The patient expressed understanding and agreed to proceed.   I discussed the assessment and treatment plan with the patient. The patient was provided an opportunity to ask questions and all were answered. The patient agreed with the plan and demonstrated an understanding of the instructions.   The patient was advised to call back or seek an in-person evaluation if the symptoms worsen or if the condition fails to improve as anticipated.  I provided 20 minutes of non-face-to-face time during this encounter.  HISTORY/CURRENT STATUS: HPI For routine med check. Wife [name] is on the phone too.  He is stable with psych meds. Pt states he is doing pretty good.  He is almost homebound, partly by choice but also because of Parkinson's.  His wife states he does not like to get out and go anywhere at all but also due to [place], there is not much they can do.  His Sinemet was increased since he was here last.  He has a lot of stiffness and trouble getting around and he needs help from [name] with all ADLs.  He enjoys watching TV.  That is about all he does.  He is physically not able to do a whole lot more.  His  energy and motivation are low due to his physical constraints.  No suicidal or homicidal thoughts.  Patient denies increased energy with decreased need for sleep, no increased talkativeness, no racing thoughts, no impulsivity or risky behaviors, no increased spending, no increased libido, no grandiosity.  Anxiety is controlled.  He is usually only taking the Xanax twice a day at the most.  He sleeps pretty well.  Individual Medical History/ Review of Systems: Changes? :Yes  Blood in stool.  Has colonoscopy soon.  Past medications for mental health diagnoses include: Lithium, Risperdal, Xanax, Synthroid, Latuda, trazodone, Depakote, Lamictal  Allergies: Codeine and Lamisil [terbinafine hcl]  Current Medications:  Current Outpatient Medications:  .  ALPRAZolam (XANAX) 1 MG tablet, TAKE ONE TABLET BY MOUTH THREE TIMES A DAY AS NEEDED FOR ANXIETY (Patient taking differently: Taking 2x daily. Will take the 3rd dose if needed.), Disp: 90 tablet, Rfl: 4 .  amLODipine (NORVASC) 5 MG tablet, Take 5 mg by mouth daily., Disp: , Rfl:  .  atorvastatin (LIPITOR) 20 MG tablet, Take 20 mg by mouth at bedtime., Disp: , Rfl:  .  carbidopa-levodopa (SINEMET IR) 25-100 MG tablet, Take 1.5 tablets by mouth 3 (three) times daily., Disp: 400 tablet, Rfl: 3 .  Cholecalciferol (VITAMIN D3) 1000 units CAPS, Take 1,000 Units by mouth daily., Disp: , Rfl:  .  gabapentin (NEURONTIN) 100 MG capsule, Take 100 mg by mouth at bedtime. , Disp: , Rfl:  .  levothyroxine (SYNTHROID) 75 MCG  tablet, TAKE ONE TABLET BY MOUTH EVERY MORNING, Disp: 90 tablet, Rfl: 1 .  lithium carbonate 300 MG capsule, TAKE TWO CAPSULES BY MOUTH AT BEDTIME, Disp: 60 capsule, Rfl: 4 .  risperidone (RISPERDAL) 4 MG tablet, TAKE 1 TABLET BY MOUTH EVERY NIGHT AT BEDTIME, Disp: 60 tablet, Rfl: 4 .  traZODone (DESYREL) 50 MG tablet, TAKE 1-2 TABLETS BY MOUTH AT BEDTIME AS NEEDED (Patient taking differently: 50 mg at bedtime as needed. ), Disp: 180 tablet,  Rfl: 0 Medication Side Effects: none  Family Medical/ Social History: Changes? No  MENTAL HEALTH EXAM:  There were no vitals taken for this visit.There is no height or weight on file to calculate BMI.  General Appearance: unable to assess  Eye Contact:  unable to assess  Speech:  Clear and Coherent and Normal Rate  Volume:  Normal  Mood:  Euthymic  Affect:  unable to assess  Thought Process:  Goal Directed and Descriptions of Associations: Intact  Orientation:  Full (Time, Place, and Person)  Thought Content: Logical   Suicidal Thoughts:  No  Homicidal Thoughts:  No  Memory:  WNL  Judgement:  Good  Insight:  Good  Psychomotor Activity:  unable to assess  Concentration:  Concentration: Good  Recall:  Good  Fund of Knowledge: Good  Language: Good  Assets:  Desire for Improvement  ADL's:  Impaired  Cognition: WNL  Prognosis:  Good    DIAGNOSES:    ICD-10-CM   1. Bipolar I disorder ([place])  F31.9   2. Generalized anxiety disorder  F41.1   3. 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 Insomnia, unspecified type  G47.00     Receiving Psychotherapy: No    RECOMMENDATIONS: I spent 20 minutes with him and [name]. PDMP was reviewed. He had labs through his PCP about 3 months ago.  [name] will have those sent to me. Continue Xanax 1 mg, 1/2-1 3 times daily as needed. Continue gabapentin 100 mg 1 nightly. Continue lithium 300 mg, 2 p.o. nightly. Continue Risperdal 4 mg nightly. Continue trazodone 50 mg, 1-2 nightly as needed.  He is usually only needing 1. Return in 6 months.  Donnal Moat, PA-C

## 2020-01-19 ENCOUNTER — Other Ambulatory Visit (INDEPENDENT_AMBULATORY_CARE_PROVIDER_SITE_OTHER): Payer: Medicare Other | Admitting: Physician Assistant

## 2020-01-19 NOTE — Progress Notes (Signed)
Labs reviewed from Dr. Ardeth Perfect  10/09/2019 lithium level was 0.8 01/16/2020 glucose was 147, BUN/creatinine was 9/1.1

## 2020-01-24 ENCOUNTER — Ambulatory Visit: Payer: Medicare Other | Admitting: Podiatry

## 2020-01-24 ENCOUNTER — Ambulatory Visit (INDEPENDENT_AMBULATORY_CARE_PROVIDER_SITE_OTHER): Payer: Medicare Other

## 2020-01-24 ENCOUNTER — Other Ambulatory Visit: Payer: Self-pay

## 2020-01-24 ENCOUNTER — Other Ambulatory Visit: Payer: Self-pay | Admitting: Podiatry

## 2020-01-24 ENCOUNTER — Encounter: Payer: Self-pay | Admitting: Podiatry

## 2020-01-24 VITALS — Temp 97.0°F

## 2020-01-24 DIAGNOSIS — S91302A Unspecified open wound, left foot, initial encounter: Secondary | ICD-10-CM | POA: Diagnosis not present

## 2020-01-24 DIAGNOSIS — L97301 Non-pressure chronic ulcer of unspecified ankle limited to breakdown of skin: Secondary | ICD-10-CM | POA: Diagnosis not present

## 2020-01-24 DIAGNOSIS — M79675 Pain in left toe(s): Secondary | ICD-10-CM

## 2020-01-24 DIAGNOSIS — B351 Tinea unguium: Secondary | ICD-10-CM

## 2020-01-24 DIAGNOSIS — L97321 Non-pressure chronic ulcer of left ankle limited to breakdown of skin: Secondary | ICD-10-CM | POA: Diagnosis not present

## 2020-01-24 NOTE — Progress Notes (Signed)
Subjective:   Patient ID: [name], male   DOB: 59 y.o.   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  If any further breakdown were to occur patient is to let us know immediately but it should be uneventful and I did debride nailbeds 1-5 both feet with no iatrogenic bleeding and will be seen back as needed

## 2020-02-02 ENCOUNTER — Ambulatory Visit: Payer: Medicare Other | Attending: Internal Medicine

## 2020-02-02 DIAGNOSIS — Z23 Encounter for immunization: Secondary | ICD-10-CM

## 2020-02-02 NOTE — Progress Notes (Signed)
   Covid-19 Vaccination Clinic  Name:  Muadh Mynatt    MRN: [id number] DOB: [birth year]  02/02/2020  Mr. Podolski was observed post Covid-19 immunization for 15 minutes without incident. He was provided with Vaccine Information Sheet and instruction to access the V-Safe system.   Mr. Mcnemar was instructed to call 911 with any severe reactions post vaccine: Marland Kitchen Difficulty breathing  . Swelling of face and throat  . A fast heartbeat  . A bad rash all over body  . Dizziness and weakness   Immunizations Administered    Name Date Dose VIS Date Route   Pfizer COVID-19 Vaccine 02/02/2020  9:35 AM 0.3 mL 10/27/2019 Intramuscular   Manufacturer: [place]   Lot: [id number]   [place]: [id number]

## 2020-02-09 ENCOUNTER — Encounter: Payer: Self-pay | Admitting: Podiatry

## 2020-02-09 ENCOUNTER — Ambulatory Visit: Payer: Medicare Other | Admitting: Podiatry

## 2020-02-09 ENCOUNTER — Other Ambulatory Visit: Payer: Self-pay

## 2020-02-09 DIAGNOSIS — L97301 Non-pressure chronic ulcer of unspecified ankle limited to breakdown of skin: Secondary | ICD-10-CM

## 2020-02-09 DIAGNOSIS — S91302A Unspecified open wound, left foot, initial encounter: Secondary | ICD-10-CM

## 2020-02-09 MED ORDER — DOXYCYCLINE HYCLATE 100 MG PO TABS: 100.0000 mg | ORAL_TABLET | Freq: Two times a day (BID) | ORAL | 1 refills | Status: DC

## 2020-02-12 NOTE — Progress Notes (Signed)
Subjective:   Patient ID: [name], male   DOB: 59 y.o.   MRN: [id number]   HPI Patient presents stating her left heel was bleeding and he was up at 4 in the morning with pain.  States that just started recently.  Presents with caregiver   ROS      Objective:  Physical Exam  Neurovascular status unchanged with patient found to have discoloration of the left posterior heel with the swelling with patient having Parkinson's disease which creates muscle weakness and other pathology but it is localized with no acute erythema edema or drainage noted     Assessment:  Low-grade break down of tissue left secondary to pressure ulceration that is localized with no indications of deep exposure subcutaneous exposure or proximal infection     Plan:  H&P as precaution measure placed on antibiotic and we will start Medihoney along with fleece booty to try to take the pressure off the back of the heel.  Patient is encouraged to try to have no pressure against it and will be seen back but this is a difficult one due to his relative chronic swelling and to his immobility with his Parkinson's

## 2020-02-17 ENCOUNTER — Other Ambulatory Visit: Payer: Self-pay | Admitting: Physician Assistant

## 2020-02-19 NOTE — Telephone Encounter (Signed)
Due back 07/11/2020 Didn't see levothyroxine in note

## 2020-02-23 ENCOUNTER — Other Ambulatory Visit: Payer: Self-pay

## 2020-02-23 ENCOUNTER — Ambulatory Visit: Payer: Medicare Other | Admitting: Podiatry

## 2020-02-23 DIAGNOSIS — R269 Unspecified abnormalities of gait and mobility: Secondary | ICD-10-CM

## 2020-02-23 DIAGNOSIS — L89620 Pressure ulcer of left heel, unstageable: Secondary | ICD-10-CM

## 2020-02-26 ENCOUNTER — Ambulatory Visit: Payer: Medicare Other | Admitting: Podiatry

## 2020-02-27 ENCOUNTER — Encounter: Payer: Self-pay | Admitting: Podiatry

## 2020-02-27 NOTE — Progress Notes (Signed)
Subjective:  Patient ID: [name], male    DOB: June 07, 1961,  MRN: [id number]  Chief Complaint  Patient presents with  . Foot Pain    pt is here for a f/u with wound to the left foot, pt states that it is looking better, but is concerned about the darkness of the bottom of left heel.    59 y.o. male presents for wound care. Patient presents with complaint of left heel pressure ulcer that is causing him a lot of pain.  Patient states that it is turning black.  Patient has been wearing pillows to offload it.  He denies any other acute complaints.  He brought an offloading pillow with him.  He denies seeing anyone else for this.  He is concerned that the heel wound might get worse.  It appears to be hard stable and as sharp in nature.  He has not tried anything else.  He is a prediabetic with A1c of 6.6.    Review of Systems: Negative except as noted in the HPI. Denies N/V/F/Ch.  Past Medical History:  Diagnosis Date  . Anxiety   . Bipolar 1 disorder ([place])   . Depression   . High blood pressure   . High cholesterol   . Parkinson's disease ([place])   . Peripheral neuropathy   . Thyroid disease   . Tremor     Current Outpatient Medications:  .  ALPRAZolam (XANAX) 1 MG tablet, TAKE ONE TABLET BY MOUTH THREE TIMES A DAY AS NEEDED FOR ANXIETY, Disp: 90 tablet, Rfl: 3 .  amLODipine (NORVASC) 5 MG tablet, Take 5 mg by mouth daily., Disp: , Rfl:  .  atorvastatin (LIPITOR) 20 MG tablet, Take 20 mg by mouth at bedtime., Disp: , Rfl:  .  carbidopa-levodopa (SINEMET IR) 25-100 MG tablet, Take 1.5 tablets by mouth 3 (three) times daily., Disp: 400 tablet, Rfl: 3 .  Cholecalciferol (VITAMIN D3) 1000 units CAPS, Take 1,000 Units by mouth daily., Disp: , Rfl:  .  doxycycline (VIBRA-TABS) 100 MG tablet, Take 1 tablet (100 mg total) by mouth 2 (two) times daily., Disp: 30 tablet, Rfl: 1 .  gabapentin (NEURONTIN) 100 MG capsule, Take 100 mg by mouth at bedtime. , Disp: , Rfl:  .  levothyroxine  (SYNTHROID) 75 MCG tablet, TAKE ONE TABLET BY MOUTH EVERY MORNING, Disp: 90 tablet, Rfl: 0 .  lithium carbonate 300 MG capsule, TAKE TWO CAPSULES BY MOUTH AT BEDTIME, Disp: 60 capsule, Rfl: 4 .  risperidone (RISPERDAL) 4 MG tablet, TAKE ONE TABLET BY MOUTH EVERY NIGHT AT BEDTIME, Disp: 60 tablet, Rfl: 3 .  traZODone (DESYREL) 50 MG tablet, TAKE 1-2 TABLETS BY MOUTH AT BEDTIME AS NEEDED (Patient taking differently: 50 mg at bedtime as needed. ), Disp: 180 tablet, Rfl: 0  Social History   Tobacco Use  Smoking Status Current Some Day Smoker  . Packs/day: 0.10  . Types: Cigarettes  Smokeless Tobacco Never Used  Tobacco Comment   only occasionally smokes    Allergies  Allergen Reactions  . Codeine Other (See Comments)    Agitation/mean   . 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 There is no height or weight on file to calculate BMI. Constitutional Well developed. Well nourished.  Vascular Dorsalis pedis pulses diminished bilaterally. Posterior tibial pulses diminished bilaterally. Capillary refill normal to all digits.  No cyanosis or clubbing noted. Pedal hair growth normal.  Neurologic Normal speech. Oriented to person, place, and time. Protective sensation absent  Dermatologic Wound Location: Left heel pressure ulcer unstageable Wound Base: Necrotic eschar Peri-wound: Normal Exudate: None: wound tissue dry Wound Measurements: -See below  Orthopedic: No pain to palpation either foot.   Radiographs: None Assessment:   1. Pressure ulcer of left heel, unstageable ([place])   2. Abnormality of gait    Plan:  Patient was evaluated and treated and all questions answered.  Ulcer left heel -Debridement as below. -Dressed with Betadine wet-to-dry, DSD. -Continue off-loading with pillows to the back of the leg as well as offloading boot.  If unable to resolve patient will benefit from cam boot  immobilization -At this time patient will benefit from aggressive Betadine wet-to-dry dressing changes at least 3 times a week with monitoring closely.  Given that this has a hard necrotic eschar this is likely due to pressure injury in the setting of questionable vascular flow.  If there is no improvement, I will consider doing further vascular work-up such as ordering ABIs during next visit.   Return in about 1 week (around 03/01/2020).

## 2020-03-01 ENCOUNTER — Ambulatory Visit: Payer: Medicare Other | Admitting: Podiatry

## 2020-03-01 ENCOUNTER — Other Ambulatory Visit: Payer: Self-pay

## 2020-03-01 DIAGNOSIS — R269 Unspecified abnormalities of gait and mobility: Secondary | ICD-10-CM

## 2020-03-01 DIAGNOSIS — L89620 Pressure ulcer of left heel, unstageable: Secondary | ICD-10-CM

## 2020-03-04 ENCOUNTER — Ambulatory Visit: Payer: Medicare Other | Attending: Internal Medicine

## 2020-03-04 DIAGNOSIS — Z23 Encounter for immunization: Secondary | ICD-10-CM

## 2020-03-04 NOTE — Progress Notes (Signed)
   Covid-19 Vaccination Clinic  Name:  [name]    MRN: [id number] DOB: 06-May-1961  03/04/2020  Mr. [name] was observed post Covid-19 immunization for 15 minutes without incident. He was provided with Vaccine Information Sheet and instruction to access the V-Safe system.   Mr. [name] was instructed to call 911 with any severe reactions post vaccine: Marland Kitchen Difficulty breathing  . Swelling of face and throat  . A fast heartbeat  . A bad rash all over body  . Dizziness and weakness   Immunizations Administered    Name Date Dose VIS Date Route   Pfizer COVID-19 Vaccine 03/04/2020  9:03 AM 0.3 mL 01/10/2019 Intramuscular   Manufacturer: [place]   Lot: [id number]   [place]: [id number]

## 2020-03-05 ENCOUNTER — Encounter: Payer: Self-pay | Admitting: Podiatry

## 2020-03-05 NOTE — Progress Notes (Signed)
Subjective:  Patient ID: [name], male    DOB: 02/06/1961,  MRN: [id number]  Chief Complaint  Patient presents with  . Foot Pain    pt is here for an ulcer check of the left heel, pt shows no signs of infection, pt is also well bandaged.    59 y.o. male presents for wound care.  Patient presents with a complaint of left heel ulceration.  He states that there is some pain associated with it.  There is no clinical signs of infection.  Patient is doing a lot better.  He is able to ambulate however with regular shoes.  Patient has been using a heel pillow.  Patient is looking to wear something that he can walk on.  I will discuss with him about cam boot to be able to walk on.  He denies any other acute complaints.  He has been doing Betadine wet-to-dry dressing changes    Review of Systems: Negative except as noted in the HPI. Denies N/V/F/Ch.  Past Medical History:  Diagnosis Date  . Anxiety   . Bipolar 1 disorder ([place])   . Depression   . High blood pressure   . High cholesterol   . Parkinson's disease ([place])   . Peripheral neuropathy   . Thyroid disease   . Tremor     Current Outpatient Medications:  .  ALPRAZolam (XANAX) 1 MG tablet, TAKE ONE TABLET BY MOUTH THREE TIMES A DAY AS NEEDED FOR ANXIETY, Disp: 90 tablet, Rfl: 3 .  amLODipine (NORVASC) 5 MG tablet, Take 5 mg by mouth daily., Disp: , Rfl:  .  atorvastatin (LIPITOR) 20 MG tablet, Take 20 mg by mouth at bedtime., Disp: , Rfl:  .  carbidopa-levodopa (SINEMET IR) 25-100 MG tablet, Take 1.5 tablets by mouth 3 (three) times daily., Disp: 400 tablet, Rfl: 3 .  Cholecalciferol (VITAMIN D3) 1000 units CAPS, Take 1,000 Units by mouth daily., Disp: , Rfl:  .  doxycycline (VIBRA-TABS) 100 MG tablet, Take 1 tablet (100 mg total) by mouth 2 (two) times daily., Disp: 30 tablet, Rfl: 1 .  gabapentin (NEURONTIN) 100 MG capsule, Take 100 mg by mouth at bedtime. , Disp: , Rfl:  .  levothyroxine (SYNTHROID) 75 MCG tablet, TAKE ONE TABLET  BY MOUTH EVERY MORNING, Disp: 90 tablet, Rfl: 0 .  lithium carbonate 300 MG capsule, TAKE TWO CAPSULES BY MOUTH AT BEDTIME, Disp: 60 capsule, Rfl: 4 .  risperidone (RISPERDAL) 4 MG tablet, TAKE ONE TABLET BY MOUTH EVERY NIGHT AT BEDTIME, Disp: 60 tablet, Rfl: 3 .  traZODone (DESYREL) 50 MG tablet, TAKE 1-2 TABLETS BY MOUTH AT BEDTIME AS NEEDED (Patient taking differently: 50 mg at bedtime as needed. ), Disp: 180 tablet, Rfl: 0  Social History   Tobacco Use  Smoking Status Current Some Day Smoker  . Packs/day: 0.10  . Types: Cigarettes  Smokeless Tobacco Never Used  Tobacco Comment   only occasionally smokes    Allergies  Allergen Reactions  . Codeine Other (See Comments)    Agitation/mean   . 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 There is no height or weight on file to calculate BMI. Constitutional Well developed. Well nourished.  Vascular Dorsalis pedis pulses diminished bilaterally. Posterior tibial pulses diminished bilaterally. Capillary refill normal to all digits.  No cyanosis or clubbing noted. Pedal hair growth normal.  Neurologic Normal speech. Oriented to person, place, and  time. Protective sensation absent  Dermatologic Wound Location: Left heel pressure ulcer unstageable Wound Base: Necrotic eschar Peri-wound: Normal Exudate: None: wound tissue dry   Orthopedic: No pain to palpation either foot.   Radiographs: None Assessment:   1. Pressure ulcer of left heel, unstageable ([place])   2. Abnormality of gait    Plan:  Patient was evaluated and treated and all questions answered.  Left heel pressure eschar with unstageable depth -Debridement as below. -Dressed with Betadine wet-to-dry, DSD. -Continue off-loading with pillows to the back of the leg as well as offloading boot.  If unable to resolve patient will benefit from cam boot immobilization -At this time patient will benefit  from aggressive Betadine wet-to-dry dressing changes at least 3 times a week with monitoring closely.   -I believe patient will benefit from vascular work-up with ABIs PVRs and vascular follow-up as needed.  He will be contacted to make an appointment to get vascular studies done. -Cam boot was dispensed to completely immobilize the heel.  He does have some difficulty walking due to immobility because of his Parkinson's.  However patient states he does ambulate.  I have asked the patient ambulate only with the boot on.  Patient states understanding   No follow-ups on file.

## 2020-03-06 ENCOUNTER — Telehealth: Payer: Self-pay | Admitting: *Deleted

## 2020-03-06 DIAGNOSIS — R0989 Other specified symptoms and signs involving the circulatory and respiratory systems: Secondary | ICD-10-CM

## 2020-03-06 DIAGNOSIS — L89620 Pressure ulcer of left heel, unstageable: Secondary | ICD-10-CM

## 2020-03-06 NOTE — Telephone Encounter (Signed)
-----   Message from Felipa Furnace, DPM sent at 03/05/2020 12:31 PM EDT ----- Regarding: Ordering a vascular studies Hi Evelise Reine,  Can you order vascular studies for this patient to evaluate the flow to the left lower extremity.  This will include ABIs and PVRs  Thanks Lennette Bihari

## 2020-03-06 NOTE — Telephone Encounter (Signed)
Faxed orders to [facility]. 

## 2020-03-07 ENCOUNTER — Other Ambulatory Visit: Payer: Self-pay

## 2020-03-07 ENCOUNTER — Ambulatory Visit (HOSPITAL_COMMUNITY)
Admission: RE | Admit: 2020-03-07 | Discharge: 2020-03-07 | Disposition: A | Discharge status: Home / Self Care | Payer: Medicare Other | Source: Ambulatory Visit | Attending: Cardiology | Admitting: Cardiology

## 2020-03-07 DIAGNOSIS — R0989 Other specified symptoms and signs involving the circulatory and respiratory systems: Secondary | ICD-10-CM | POA: Diagnosis present

## 2020-03-07 DIAGNOSIS — L89620 Pressure ulcer of left heel, unstageable: Secondary | ICD-10-CM | POA: Diagnosis present

## 2020-03-08 ENCOUNTER — Telehealth: Payer: Self-pay | Admitting: *Deleted

## 2020-03-08 ENCOUNTER — Ambulatory Visit: Payer: Medicare Other | Admitting: Podiatry

## 2020-03-08 ENCOUNTER — Other Ambulatory Visit: Payer: Self-pay | Admitting: Podiatry

## 2020-03-08 ENCOUNTER — Encounter: Payer: Self-pay | Admitting: Podiatry

## 2020-03-08 DIAGNOSIS — E08621 Diabetes mellitus due to underlying condition with foot ulcer: Secondary | ICD-10-CM | POA: Diagnosis not present

## 2020-03-08 DIAGNOSIS — L97402 Non-pressure chronic ulcer of unspecified heel and midfoot with fat layer exposed: Secondary | ICD-10-CM

## 2020-03-08 DIAGNOSIS — L97422 Non-pressure chronic ulcer of left heel and midfoot with fat layer exposed: Secondary | ICD-10-CM | POA: Diagnosis not present

## 2020-03-08 DIAGNOSIS — I999 Unspecified disorder of circulatory system: Secondary | ICD-10-CM

## 2020-03-08 MED ORDER — SANTYL 250 UNIT/GM EX OINT
1.0000 | TOPICAL_OINTMENT | Freq: Every day | CUTANEOUS | 0 refills | Status: DC
Start: 2020-03-08 — End: 2021-01-08

## 2020-03-08 MED ORDER — SANTYL 250 UNIT/GM EX OINT: 1.0000 "application " | TOPICAL_OINTMENT | Freq: Every day | CUTANEOUS | 0 refills | Status: DC

## 2020-03-08 NOTE — Progress Notes (Signed)
Subjective:  Patient ID: [name], male    DOB: 12-Feb-1961,  MRN: [id number]  Chief Complaint  Patient presents with  . Foot Pain    pt is here for ulcer of the left heel, pt states that he is well bandaged, pt also states that he is concerned with possible oozing as well.    59 y.o. male presents for wound care.  Patient presents with a complaint of left heel ulceration.  They have been doing Betadine wet-to-dry dressing changes.  No clinical signs of infection.  It appears to be doing really well.  Patient has been ambulating with a cam boot.  No acute complaints.    Review of Systems: Negative except as noted in the HPI. Denies N/V/F/Ch.  Past Medical History:  Diagnosis Date  . Anxiety   . Bipolar 1 disorder ([place])   . Depression   . High blood pressure   . High cholesterol   . Parkinson's disease ([place])   . Peripheral neuropathy   . Thyroid disease   . Tremor     Current Outpatient Medications:  .  ALPRAZolam (XANAX) 1 MG tablet, TAKE ONE TABLET BY MOUTH THREE TIMES A DAY AS NEEDED FOR ANXIETY, Disp: 90 tablet, Rfl: 3 .  amLODipine (NORVASC) 5 MG tablet, Take 5 mg by mouth daily., Disp: , Rfl:  .  atorvastatin (LIPITOR) 20 MG tablet, Take 20 mg by mouth at bedtime., Disp: , Rfl:  .  carbidopa-levodopa (SINEMET IR) 25-100 MG tablet, Take 1.5 tablets by mouth 3 (three) times daily., Disp: 400 tablet, Rfl: 3 .  Cholecalciferol (VITAMIN D3) 1000 units CAPS, Take 1,000 Units by mouth daily., Disp: , Rfl:  .  doxycycline (VIBRA-TABS) 100 MG tablet, Take 1 tablet (100 mg total) by mouth 2 (two) times daily., Disp: 30 tablet, Rfl: 1 .  gabapentin (NEURONTIN) 100 MG capsule, Take 100 mg by mouth at bedtime. , Disp: , Rfl:  .  levothyroxine (SYNTHROID) 75 MCG tablet, TAKE ONE TABLET BY MOUTH EVERY MORNING, Disp: 90 tablet, Rfl: 0 .  lithium carbonate 300 MG capsule, TAKE TWO CAPSULES BY MOUTH AT BEDTIME, Disp: 60 capsule, Rfl: 4 .  risperidone (RISPERDAL) 4 MG tablet, TAKE ONE  TABLET BY MOUTH EVERY NIGHT AT BEDTIME, Disp: 60 tablet, Rfl: 3 .  traZODone (DESYREL) 50 MG tablet, TAKE 1-2 TABLETS BY MOUTH AT BEDTIME AS NEEDED (Patient taking differently: 50 mg at bedtime as needed. ), Disp: 180 tablet, Rfl: 0 .  collagenase (SANTYL) ointment, Apply 1 application topically daily., Disp: 15 g, Rfl: 0  Social History   Tobacco Use  Smoking Status Current Some Day Smoker  . Packs/day: 0.10  . Types: Cigarettes  Smokeless Tobacco Never Used  Tobacco Comment   only occasionally smokes    Allergies  Allergen Reactions  . Codeine Other (See Comments)    Agitation/mean   . 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 There is no height or weight on file to calculate BMI. Constitutional Well developed. Well nourished.  Vascular Dorsalis pedis pulses diminished bilaterally. Posterior tibial pulses diminished bilaterally. Capillary refill normal to all digits.  No cyanosis or clubbing noted. Pedal hair growth normal.  Neurologic Normal speech. Oriented to person, place, and time. Protective sensation absent  Dermatologic Wound Location: Left heel pressure ulcer unstageable Wound Base: Necrotic eschar Peri-wound: Normal Exudate: None: wound tissue dry   Orthopedic: No pain to  palpation either foot.   Radiographs: None Assessment:   No diagnosis found. Plan:  Patient was evaluated and treated and all questions answered.  Left heel pressure ulcer with fat layer exposed -The wound itself has appeared to regress with the eschar decreasing and the underlying wound with fat layer exposed.  I believe patient will benefit with Santyl wet-to-dry -Debridement as below. -Dressed with a wet-to-dry, DSD. -Continue off-loading with pillows to the back of the leg as well as offloading boot.  If unable to resolve patient will benefit from cam boot immobilization -At this time patient will benefit  from aggressive Betadine wet-to-dry dressing changes at least 3 times a week with monitoring closely.   -ABI PVRs were reviewed.  It appears that there is a mild concern for peripheral vascular disease to the left lower extremity.  I believe patient will benefit from evaluation with Dr. Damita Dunnings to see if there is any ways to improve the flow given that there is a soft tissue defect to the left heel. -Cam boot was dispensed to completely immobilize the heel.  He does have some difficulty walking due to immobility because of his Parkinson's.  However patient states he does ambulate.  I have asked the patient ambulate only with the boot on.  Patient states understanding  Procedure: Excisional Debridement of Wound Rationale: Removal of non-viable soft tissue from the wound to promote healing.  Anesthesia: none Pre-Debridement Wound Measurements: 1 cm x 1 cm x 0.3 cm Post-Debridement Wound Measurements: Same Type of Debridement: Sharp Excisional Tissue Removed: Non-viable soft tissue Depth of Debridement: subcutaneous tissue. Technique: Sharp excisional debridement to bleeding, viable wound base.  Dressing: Dry, sterile, compression dressing. Disposition: Patient tolerated procedure well. Patient to return in 1 week for follow-up.  No follow-ups on file.     No follow-ups on file.

## 2020-03-08 NOTE — Telephone Encounter (Signed)
Left message [name] 280 informing it would be fine to dispense 30grams of santyl.

## 2020-03-08 NOTE — Telephone Encounter (Signed)
Levada Dy, Pharmacist - Kristopher Oppenheim states the smallest amount in santyl is 30grams.

## 2020-03-14 ENCOUNTER — Encounter: Payer: Self-pay | Admitting: *Deleted

## 2020-03-14 ENCOUNTER — Ambulatory Visit
Admission: RE | Admit: 2020-03-14 | Discharge: 2020-03-14 | Disposition: A | Discharge status: Home / Self Care | Payer: Medicare Other | Source: Ambulatory Visit | Attending: Podiatry | Admitting: Podiatry

## 2020-03-14 DIAGNOSIS — L97402 Non-pressure chronic ulcer of unspecified heel and midfoot with fat layer exposed: Secondary | ICD-10-CM

## 2020-03-14 DIAGNOSIS — E08621 Diabetes mellitus due to underlying condition with foot ulcer: Secondary | ICD-10-CM

## 2020-03-14 HISTORY — PX: IR RADIOLOGIST EVAL & MGMT: IMG5224

## 2020-03-14 NOTE — Consult Note (Signed)
Chief Complaint: Left heel wound  Referring Physician(s): Dr. Boneta Lucks  History of Present Illness: [name] is a 59 y.o. male presenting as a scheduled consultation to [place] clinic today, kindly referred by Dr. Posey Pronto of [facility], for evaluation of left heel wound and PAD.   [name] is here today with his wife for our interview.  His wife provides the given history, as [name] has additional diagnosis of Parkinsons disease.   They tell me that the wound on his left heel developed a few months ago, but definitely after the New Year.  He has never had a wound on the left before.  He has a prior surgery on the right foot with [facility].    His wife tells me that she reached out with recognition of the problem, as the wound started to grow in size, and developed a "black" appearance.  He did complete a short course of empiric doxycycline ABX, but never needed any hospitalization.  The wound has been receiving debridement.    The wound is painless.   He denies any resting pain symptoms.  He is not ambulating at home, so there is no history of claudication symptoms.  He denies any prior MI or stroke.  He has no resting chest pain or SOB.   She tells me that during the day, they are currently floating the heel with a foam slipper, and they use a walking boot when leaving the house.    He sleeps with 2 or 3 pillows under his feet for elevation.    Non-invasive testing performed 4/22: Right ABI: 1.09 Left ABI: 0.94.    CV risk factors:  Smoking (quit 1 year ago, smoked since 59yo), HTN, HLD, age  Additionally, they tell me that he had a recent colonoscopy, with multiple polyps removed. 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 Michael Boston of [facility]. I cannot view any pathology regarding these polyps.    Past Medical History:  Diagnosis Date  . Anxiety   . Bipolar 1 disorder ([place])   . Depression     . High blood pressure   . High cholesterol   . Parkinson's disease ([place])   . Peripheral neuropathy   . Thyroid disease   . Tremor     Past Surgical History:  Procedure Laterality Date  . HEMORRHOID SURGERY     1980's  . MULTIPLE TOOTH EXTRACTIONS     2016 Has top plate    Allergies: Codeine and Lamisil [terbinafine hcl]  Medications: Prior to Admission medications   Medication Sig Start Date End Date Taking? Authorizing Provider  ALPRAZolam Duanne Moron) 1 MG tablet TAKE ONE TABLET BY MOUTH THREE TIMES A DAY AS NEEDED FOR ANXIETY 02/19/20   Hurst, Helene Kelp T, PA-C  amLODipine (NORVASC) 5 MG tablet Take 5 mg by mouth daily.    [provider]  atorvastatin (LIPITOR) 20 MG tablet Take 20 mg by mouth at bedtime.    [provider]  carbidopa-levodopa (SINEMET IR) 25-100 MG tablet Take 1.5 tablets by mouth 3 (three) times daily. 11/22/19   Suzzanne Cloud, NP  Cholecalciferol (VITAMIN D3) 1000 units CAPS Take 1,000 Units by mouth daily.    [provider]  collagenase (SANTYL) ointment Apply 1 application topically daily. 03/08/20   Felipa Furnace, DPM  doxycycline (VIBRA-TABS) 100 MG tablet Take 1 tablet (100 mg total) by mouth 2 (two)  times daily. 02/09/20   Wallene Huh, DPM  gabapentin (NEURONTIN) 100 MG capsule Take 100 mg by mouth at bedtime.  10/16/19   [provider]  levothyroxine (SYNTHROID) 75 MCG tablet TAKE ONE TABLET BY MOUTH EVERY MORNING 02/19/20   Donnal Moat T, PA-C  lithium carbonate 300 MG capsule TAKE TWO CAPSULES BY MOUTH AT BEDTIME 10/25/19   Hurst, Teresa T, PA-C  risperidone (RISPERDAL) 4 MG tablet TAKE ONE TABLET BY MOUTH EVERY NIGHT AT BEDTIME 02/19/20   Hurst, Helene Kelp T, PA-C  traZODone (DESYREL) 50 MG tablet TAKE 1-2 TABLETS BY MOUTH AT BEDTIME AS NEEDED Patient taking differently: 50 mg at bedtime as needed.  11/23/19   Addison Lank, PA-C     Family History  Problem Relation Age of Onset  . Heart attack Mother   . Liver disease  Father     Social History   Socioeconomic History  . Marital status: Married    Spouse name: [name]  . Number of children: 1  . Years of education: 20  . Highest education level: Not on file  Occupational History    Comment: Not working  Tobacco Use  . Smoking status: Current Some Day Smoker    Packs/day: 0.10    Types: Cigarettes  . Smokeless tobacco: Never Used  . Tobacco comment: only occasionally smokes  Substance and Sexual Activity  . Alcohol use: No  . Drug use: No    Comment: Quit 2009  . Sexual activity: Not on file  Other Topics Concern  . Not on file  Social History Narrative   Patient lives at home with with [name]). Patient is not working at this time. Patient has a 12th grade education.    Caffeine - four mountain dews daily.   Right handed.   Patient has one child.   Social Determinants of Health   Financial Resource Strain:   . Difficulty of Paying Living Expenses:   Food Insecurity:   . Worried About Charity fundraiser in the Last Year:   . Arboriculturist in the Last Year:   Transportation Needs:   . Film/video editor (Medical):   Marland Kitchen Lack of Transportation (Non-Medical):   Physical Activity:   . Days of Exercise per Week:   . Minutes of Exercise per Session:   Stress:   . Feeling of Stress :   Social Connections:   . Frequency of Communication with Friends and Family:   . Frequency of Social Gatherings with Friends and Family:   . Attends Religious Services:   . Active Member of Clubs or Organizations:   . Attends Archivist Meetings:   Marland Kitchen Marital Status:        Review of Systems: A 12 point ROS discussed and pertinent positives are indicated in the HPI above.  All other systems are negative.  Review of Systems  Vital Signs: There were no vitals taken for this visit.  Physical Exam General: 59 yo male appearing stated age.  Well-developed, well-nourished.  No distress. HEENT: Atraumatic, normocephalic.   Conjugate gaze, extra-ocular motor intact. No scleral icterus or scleral injection. No lesions on external ears, nose, lips, or gums.  Oral mucosa moist, pink.  Neck: Symmetric with no goiter enlargement.  Chest/Lungs:  Symmetric chest with inspiration/expiration.  No labored breathing.  Clear to auscultation with no wheezes, rhonchi, or rales.  Heart:  RRR, with no third heart sounds appreciated. No JVD appreciated.  Abdomen:  Soft, NT/ND,  with + bowel sounds.   Genito-urinary: Deferred Neurologic: Flattened affect. Moving all 4 extremities with gross sensory intact. Slow gait.  Pulse Exam:  No bruit appreciated.  No palpable pulsatile abdominal mass.  Doppler + signal in the bilateral PT and DP.  Extremities: Circular full thickness wound of the left heel without a mounded margin.  About size of half-dollar.  Granulation tissue present, without oozing.  No erythema or foul odor.  He also has a small eschar at the tip of the left great toe, without erythema.    Imaging: VAS [place] ABI WITH/WO TBI  Result Date: 03/08/2020 LOWER EXTREMITY DOPPLER STUDY Indications: Ulceration. Patient referred from Podiatry for a left heel              ulceration.              Per chart : Low-grade break down of tissue left secondary to              pressure ulceration that is localized with no indications of deep              exposure subcutaneous exposure or proximal infection. It has been              present for about 2 months. Patient caregiver states it has been              healing and improving with wet to dry dressings and bandaging. High Risk Factors: Hypertension, current smoker. Other Factors: Parkisonism/abnormal gait.  Comparison Study: Previous ABIs performed at [facility] on 04/28/19 were                   1.01 on the right and 1.07 on the left. Performing Technologist: Mariane Masters RVT  Examination Guidelines: A complete evaluation includes at minimum, Doppler waveform signals and systolic blood  pressure reading at the level of bilateral brachial, anterior tibial, and posterior tibial arteries, when vessel segments are accessible. Bilateral testing is considered an integral part of a complete examination. Photoelectric Plethysmograph (PPG) waveforms and toe systolic pressure readings are included as required and additional duplex testing as needed. Limited examinations for reoccurring indications may be performed as noted.  ABI Findings: +---------+------------------+-----+---------+--------+ Right    Rt Pressure (mmHg)IndexWaveform Comment  +---------+------------------+-----+---------+--------+ Brachial 123                                      +---------+------------------+-----+---------+--------+ ATA      120               0.96 triphasic         +---------+------------------+-----+---------+--------+ PTA      136               1.09 triphasic         +---------+------------------+-----+---------+--------+ PERO     121               0.97 triphasic         +---------+------------------+-----+---------+--------+ Great Toe144               1.15 Normal            +---------+------------------+-----+---------+--------+ +---------+------------------+-----+-----------+------------------------+ Left     Lt Pressure (mmHg)IndexWaveform   Comment                  +---------+------------------+-----+-----------+------------------------+ Brachial 125                                                        +---------+------------------+-----+-----------+------------------------+  ATA      118               0.94 multiphasic                         +---------+------------------+-----+-----------+------------------------+ PTA      116               0.93 triphasic                           +---------+------------------+-----+-----------+------------------------+ PERO     116               0.93 monophasic difficult to hear/record  +---------+------------------+-----+-----------+------------------------+ Great Toe116               0.93 Normal                              +---------+------------------+-----+-----------+------------------------+ +-------+-----------+-----------+------------+------------+ ABI/TBIToday's ABIToday's TBIPrevious ABIPrevious TBI +-------+-----------+-----------+------------+------------+ Right  1.09       1.15       1.01        1.07         +-------+-----------+-----------+------------+------------+ Left   0.94       0.93       1.07        1.05         +-------+-----------+-----------+------------+------------+ TOES Findings: +----------+---------------+--------+-------+ Right ToesPressure (mmHg)WaveformComment +----------+---------------+--------+-------+ 1st Digit                Normal          +----------+---------------+--------+-------+ 2nd Digit                Normal          +----------+---------------+--------+-------+ 3rd Digit                Normal          +----------+---------------+--------+-------+ 4th Digit                Normal          +----------+---------------+--------+-------+ 5th Digit                Normal          +----------+---------------+--------+-------+  +---------+---------------+--------+-------+ Left ToesPressure (mmHg)WaveformComment +---------+---------------+--------+-------+ 1st Digit               Normal          +---------+---------------+--------+-------+ 2nd Digit               Normal          +---------+---------------+--------+-------+ 3rd Digit               Normal          +---------+---------------+--------+-------+ 4th Digit               Normal          +---------+---------------+--------+-------+ 5th Digit               Normal          +---------+---------------+--------+-------+   Right ABIs appear essentially unchanged compared to prior study on 04/28/19. Left ABIs appear mildly  decreased compared to prior study on 04/28/19.  Summary: Right: Resting right ankle-brachial index is within normal range. No evidence of significant right lower extremity arterial disease. The right toe-brachial index is normal. Left: Resting left ankle-brachial index indicates mild left lower extremity arterial disease.  The left toe-brachial index is normal.  *See table(s) above for measurements and observations. See arterial duplex report.  Electronically signed by Ida Rogue MD on 03/08/2020 at 2:14:41 PM.    Final    VAS [place] LOWER EXTREMITY ARTERIAL DUPLEX  Result Date: 03/08/2020 LOWER EXTREMITY ARTERIAL DUPLEX STUDY Indications: Ulceration. Patient referred from Podiatry for a left heel              ulceration. Per chart: Low-grade breakdown of tissue left secondary              to pressure ulceration that is localized with no indications of              deep exposure subcutaneous tissue exposure or proximal infection.              It has been present for about 2 months. Patient caregiver states it              has been healing and improving with wet to dry dressings and              bandaging/offloading. High Risk Factors: Hypertension, current smoker. Other Factors: Parkinsonism/abnormal gait.  Current ABI: Today's ABIs were 1.09 on the right and 0.94 on the left. Performing Technologist: Mariane Masters RVT  Examination Guidelines: A complete evaluation includes B-mode imaging, spectral Doppler, color Doppler, and power Doppler as needed of all accessible portions of each vessel. Bilateral testing is considered an integral part of a complete examination. Limited examinations for reoccurring indications may be performed as noted. Aorta: +--------+-------+----------+----------+---------+--------+-----+         AP (cm)Trans (cm)PSV (cm/s)Waveform ThrombusShape +--------+-------+----------+----------+---------+--------+-----+ Proximal       1.90      65        biphasic                +--------+-------+----------+----------+---------+--------+-----+ Mid     1.50   1.60      55        biphasic               +--------+-------+----------+----------+---------+--------+-----+ Distal  1.40   1.50      49        triphasic              +--------+-------+----------+----------+---------+--------+-----+ Patent IVC prox.  +-----------+--------+-----+--------+---------+-------------+ RIGHT      PSV cm/sRatioStenosisWaveform Comments      +-----------+--------+-----+--------+---------+-------------+ CIA Prox   103                  triphasic              +-----------+--------+-----+--------+---------+-------------+ CIA Mid    125                  triphasic              +-----------+--------+-----+--------+---------+-------------+ CIA Distal 141                  triphasic              +-----------+--------+-----+--------+---------+-------------+ EIA Prox   176                  triphasic              +-----------+--------+-----+--------+---------+-------------+ EIA Mid    160                  triphasic              +-----------+--------+-----+--------+---------+-------------+ EIA Distal  180                  triphasic<50% stenosis +-----------+--------+-----+--------+---------+-------------+ CFA Prox   108                  triphasic              +-----------+--------+-----+--------+---------+-------------+ CFA Distal                               plaque        +-----------+--------+-----+--------+---------+-------------+ DFA        71                   biphasic               +-----------+--------+-----+--------+---------+-------------+ SFA Prox   92                   triphasic              +-----------+--------+-----+--------+---------+-------------+ SFA Mid    100                  triphasic              +-----------+--------+-----+--------+---------+-------------+ SFA Distal 81                    triphasicplaque        +-----------+--------+-----+--------+---------+-------------+ POP Prox   72                   triphasic              +-----------+--------+-----+--------+---------+-------------+ POP Distal 71                   triphasic              +-----------+--------+-----+--------+---------+-------------+ TP Trunk   79                   triphasic              +-----------+--------+-----+--------+---------+-------------+ ATA Prox   91                                          +-----------+--------+-----+--------+---------+-------------+ ATA Mid    62                                          +-----------+--------+-----+--------+---------+-------------+ ATA Distal 44                                          +-----------+--------+-----+--------+---------+-------------+ PTA Prox   63                                          +-----------+--------+-----+--------+---------+-------------+ PTA Mid    76                                          +-----------+--------+-----+--------+---------+-------------+ PTA Distal 57                                          +-----------+--------+-----+--------+---------+-------------+  PERO Mid   69                                          +-----------+--------+-----+--------+---------+-------------+ PERO Distal31                                          +-----------+--------+-----+--------+---------+-------------+ Minimal scattered atherosclerosis throughout the right lower extremity arteries without evidence of stenosis.  +-----------+--------+-----+---------------+---------------+-------------+ LEFT       PSV cm/sRatioStenosis       Waveform       Comments      +-----------+--------+-----+---------------+---------------+-------------+ CIA Prox   73                          triphasic                    +-----------+--------+-----+---------------+---------------+-------------+ CIA Mid     123                         nearly biphasic              +-----------+--------+-----+---------------+---------------+-------------+ CIA Distal 99                          triphasic                    +-----------+--------+-----+---------------+---------------+-------------+ EIA Prox   176                         triphasic      <50% stenosis +-----------+--------+-----+---------------+---------------+-------------+ EIA Mid    152                         triphasic                    +-----------+--------+-----+---------------+---------------+-------------+ EIA Distal                             triphasic                    +-----------+--------+-----+---------------+---------------+-------------+ CFA Prox   160          30-49% stenosistriphasic      plaque        +-----------+--------+-----+---------------+---------------+-------------+ CFA Distal                                            plaque        +-----------+--------+-----+---------------+---------------+-------------+ DFA        128                         triphasic                    +-----------+--------+-----+---------------+---------------+-------------+ SFA Prox   124                         triphasic      plaque        +-----------+--------+-----+---------------+---------------+-------------+  SFA Mid    95                          triphasic                    +-----------+--------+-----+---------------+---------------+-------------+ SFA Distal 145                         triphasic      plaque        +-----------+--------+-----+---------------+---------------+-------------+ POP Prox   102                         triphasic                    +-----------+--------+-----+---------------+---------------+-------------+ POP Distal 79                          triphasic                    +-----------+--------+-----+---------------+---------------+-------------+ TP Trunk    77                          triphasic                    +-----------+--------+-----+---------------+---------------+-------------+ ATA Prox   58                                                       +-----------+--------+-----+---------------+---------------+-------------+ ATA Mid    58                                                       +-----------+--------+-----+---------------+---------------+-------------+ ATA Distal 63                                                       +-----------+--------+-----+---------------+---------------+-------------+ PTA Prox   65                                                       +-----------+--------+-----+---------------+---------------+-------------+ PTA Mid    63                                                       +-----------+--------+-----+---------------+---------------+-------------+ PTA Distal 62                                                       +-----------+--------+-----+---------------+---------------+-------------+  PERO Prox  33                                                       +-----------+--------+-----+---------------+---------------+-------------+ PERO Mid   41                                                       +-----------+--------+-----+---------------+---------------+-------------+ PERO Distal27                                                       +-----------+--------+-----+---------------+---------------+-------------+ Mixed atherosclerosis throughout the left lower extremity. 30-49% stenosis of the common femoral artery. Left plantar acceleration times were 63 and 83 ms, which is in the asymptomatic/no ischemia range.  Summary: Right: <50% stenosis of the right external iliac artery. Minimal scattered atherosclerosis throughout the right lower extremity without evidence of focal stenosis. Left: <50% stenosis of the left external iliac artery. 30-49% stenosis noted in the  common femoral artery. No evidence of signifcant femoral-popliteal or tibial vessel disease.  See table(s) above for measurements and observations. See ABI report.  Electronically signed by Ida Rogue MD on 03/08/2020 at 2:15:47 PM.    Final     Labs:  CBC: Recent Labs    10/10/19 1159  WBC 12.2*  HGB 11.7*  HCT 38.5*  PLT 202    COAGS: No results for input(s): INR, APTT in the last 8760 hours.  BMP: Recent Labs    10/10/19 1159  NA 141  K 3.5  CL 104  CO2 24  GLUCOSE 98  BUN 9  CALCIUM 9.2  CREATININE 1.03  GFRNONAA >60  GFRAA >60    LIVER FUNCTION TESTS: No results for input(s): BILITOT, AST, ALT, ALKPHOS, PROT, ALBUMIN in the last 8760 hours.  TUMOR MARKERS: No results for input(s): AFPTM, CEA, CA199, CHROMGRNA in the last 8760 hours.  Assessment and Plan:  Assessment:  [name] is a 59yo male presenting with left heel wound, compatible with [place] 6 category symptoms of CLI/CLTI.   Non-invasive lower extremity exam shows tibial disease, with a potentially falsely elevated left ABI.   Calculated WIfI matrix placed him in the moderate category of risk (using a moderate range ABI as surrogate), with high benefit of potential revascularization.   I had a discussion with [name] and his wife regarding anatomy, pathology/pathophysiology, natural history, and prognosis of PAD/CLI.  Informed consent regarding treatment strategies was performed which would possibly include medical management,   surgical strategy, and/or endovascular options, with risk/benefit discussion.  The indications for treatment supported by updated guidelines1, 2 were discussed.   I did tell them that while he is at no imminent risk for amputation, and that I feel that he would likely heal with excellent continued wound care, he does stand to benefit with improved blood flow to the region.   After our discussion of the options, he and his wife wound like to proceed with endovascular  options.   Regarding endovascular options, specific risks discussed include: bleeding, infection, contrast  reaction, renal injury/nephropathy, arterial injury/dissection, need for additional procedure/surgery, worsening symptoms/tissue including limb loss, cardiopulmonary collapse, death.    Regarding medical management, maximal medical therapy for reduction of risk factors is indicated as recommended by updated AHA guidelines1.  This includes anti-platelet medication, tight blood pressure control, maximum-dose HMG-CoA reductase inhibitor.  Currently he is not taking ASA.   Annual flu vaccination is also recommended, with Class 1 recommendation1.   With the knowledge that he has upcoming appointment to see [facility] for what sounds like a positive colonoscopy, I think the best way to proceed is wait until after his consultation, as I suspect that any anti-platelet medication would need to be held for that surgery.  Given that we would want a 110-month course post-intervention of dual anti-platelet, we can establish a plan once he has his general surgery treatment plan. They understand and agree.   Plan: - Once the patient has his general surgery plan with [facility] for his positive findings on colonoscopy, we can establish our plan for lower extremity angiogram and possible intervention, target left lower extremity.  - Continue excellent wound care.  - Continue maximal medical therapy for cardiovascular risk reduction, including anti-platelet therapy. -Annual flu vaccination is recommended in the setting of known PAD, in the absence of contra-indications.    ___________________________________________________________________   1Morley Kos MD, et al. 2016 AHA/ACC Guideline on the Management of Patients With Lower Extremity Peripheral Artery Disease: Executive Summary: A Report of the American College of Cardiology/American Heart Association Task Force on Clinical Practice  Guidelines. J Am Coll Cardiol. 2017 Mar 21;69(11):1465-1508. doi: 10.1016/j.jacc.2016.11.008.   2 - Norgren L, et al. TASC II Working Group. Inter-society consensus for the management of peripheral arterial disease. Int Tressia Miners. 2007 Jun;26(2):81-157. Review. PubMed PMID: [id number]  3 - Hingorani A, et al. The management of diabetic foot: A clinical practice guideline by the Society for Vascular Surgery in collaboration with the [place] and the Society  for Vascular Medicine. J Vasc Surg. 2016 Feb;63(2 Suppl):3S-21S. doi: 10.1016/j.jvs.2015.10.003. PubMed PMID: [id number].  4 - Corinna Gab, Saab FA, Luberta Mutter, Grant Ruts, Ewell Poe, Driver VR, [place], Lookstein R, van den Baldemar Lenis, Jaff Jimmy, Guadalupe Dawn, Henao S, AlMahameed A, Katzen B. Digital Subtraction Angiography Prior to an Amputation for Critical Limb Ischemia (CLI): An Expert Recommendation Statement From the CLI Global Society to Optimize Limb Salvage. J Endovasc Ther. 2020 Aug;27(4):540-546. doi: 10.1177/1526602820928590. Epub 2020 May 29. PMID: [id number].    Thank you for this interesting consult.  I greatly enjoyed meeting [name] and look forward to participating in their care.  A copy of this report was sent to the requesting provider on this date.  Electronically Signed: Corrie Mckusick 03/14/2020, 3:49 PM   I spent a total of  60 Minutes   in face to face in clinical consultation, greater than 50% of which was counseling/coordinating care for left lower extremity wound, [place] [address], with possible angiogram and intervention

## 2020-03-22 ENCOUNTER — Other Ambulatory Visit: Payer: Self-pay

## 2020-03-22 ENCOUNTER — Ambulatory Visit: Payer: Medicare Other | Admitting: Podiatry

## 2020-03-22 ENCOUNTER — Encounter: Payer: Self-pay | Admitting: Podiatry

## 2020-03-22 DIAGNOSIS — E08621 Diabetes mellitus due to underlying condition with foot ulcer: Secondary | ICD-10-CM | POA: Diagnosis not present

## 2020-03-22 DIAGNOSIS — L97422 Non-pressure chronic ulcer of left heel and midfoot with fat layer exposed: Secondary | ICD-10-CM

## 2020-03-22 NOTE — Progress Notes (Signed)
Subjective:  Patient ID: [name], male    DOB: 1961/08/09,  MRN: [id number]  Chief Complaint  Patient presents with  . Foot Ulcer    pt is here for a ulcer of the left heel, pt does have some redness of the left heel, pt is also concerned about the left big toe as well.    59 y.o. male presents for wound care.  Patient presents with a complaint of left heel ulceration.  They have been doing Betadine wet-to-dry dressing changes.  No clinical signs of infection.  It appears to be doing really well.  Patient has been ambulating with a cam boot.  No acute complaints.    Review of Systems: Negative except as noted in the HPI. Denies N/V/F/Ch.  Past Medical History:  Diagnosis Date  . Anxiety   . Bipolar 1 disorder ([place])   . Depression   . High blood pressure   . High cholesterol   . Parkinson's disease ([place])   . Peripheral neuropathy   . Thyroid disease   . Tremor     Current Outpatient Medications:  .  ALPRAZolam (XANAX) 1 MG tablet, TAKE ONE TABLET BY MOUTH THREE TIMES A DAY AS NEEDED FOR ANXIETY, Disp: 90 tablet, Rfl: 3 .  amLODipine (NORVASC) 5 MG tablet, Take 5 mg by mouth daily., Disp: , Rfl:  .  aspirin (ASPIR-LOW) 81 MG EC tablet, , Disp: , Rfl:  .  atorvastatin (LIPITOR) 20 MG tablet, Take 20 mg by mouth at bedtime., Disp: , Rfl:  .  carbidopa-levodopa (SINEMET IR) 25-100 MG tablet, Take 1.5 tablets by mouth 3 (three) times daily., Disp: 400 tablet, Rfl: 3 .  Cholecalciferol (VITAMIN D3) 1000 units CAPS, Take 1,000 Units by mouth daily., Disp: , Rfl:  .  collagenase (SANTYL) ointment, Apply 1 application topically daily., Disp: 30 g, Rfl: 0 .  doxycycline (VIBRA-TABS) 100 MG tablet, Take 1 tablet (100 mg total) by mouth 2 (two) times daily., Disp: 30 tablet, Rfl: 1 .  gabapentin (NEURONTIN) 100 MG capsule, Take 100 mg by mouth at bedtime. , Disp: , Rfl:  .  levothyroxine (SYNTHROID) 75 MCG tablet, TAKE ONE TABLET BY MOUTH EVERY MORNING, Disp: 90 tablet, Rfl: 0 .   lithium carbonate 300 MG capsule, TAKE TWO CAPSULES BY MOUTH AT BEDTIME, Disp: 60 capsule, Rfl: 4 .  risperidone (RISPERDAL) 4 MG tablet, TAKE ONE TABLET BY MOUTH EVERY NIGHT AT BEDTIME, Disp: 60 tablet, Rfl: 3 .  traZODone (DESYREL) 50 MG tablet, TAKE 1-2 TABLETS BY MOUTH AT BEDTIME AS NEEDED (Patient taking differently: 50 mg at bedtime as needed. ), Disp: 180 tablet, Rfl: 0  Social History   Tobacco Use  Smoking Status Current Some Day Smoker  . Packs/day: 0.10  . Types: Cigarettes  Smokeless Tobacco Never Used  Tobacco Comment   only occasionally smokes    Allergies  Allergen Reactions  . Codeine Other (See Comments)    Agitation/mean   . 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 There is no height or weight on file to calculate BMI. Constitutional Well developed. Well nourished.  Vascular Dorsalis pedis pulses diminished bilaterally. Posterior tibial pulses diminished bilaterally. Capillary refill normal to all digits.  No cyanosis or clubbing noted. Pedal hair growth normal.  Neurologic Normal speech. Oriented to person, place, and time. Protective sensation absent  Dermatologic Wound Location: Left heel pressure ulcer unstageable Wound Base:  Necrotic eschar Peri-wound: Normal Exudate: None: wound tissue dry   Orthopedic: No pain to palpation either foot.   Radiographs: None Assessment:   1. 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  Left heel pressure ulcer with fat layer exposed -The wound itself has appeared to regress with the eschar decreasing and the underlying wound with fat layer exposed.  I believe patient will benefit with Santyl wet-to-dry -Debridement as below. -Dressed with a wet-to-dry, DSD. -Continue off-loading with  pillows to the back of the leg as well as offloading boot.  If unable to resolve patient will benefit from cam boot immobilization -At this time patient will benefit from aggressive Betadine wet-to-dry dressing changes at least 3 times a week with monitoring closely.   -ABI PVRs were reviewed.  It appears that there is a mild concern for peripheral vascular disease to the left lower extremity.  -Patient was seen by Dr. Earleen Newport and will plan on scheduling him after his other medical issues have been resolved. -Cam boot was dispensed to completely immobilize the heel.  He does have some difficulty walking due to immobility because of his Parkinson's.  However patient states he does ambulate.  I have asked the patient ambulate only with the boot on.  Patient states understanding -I will continue local wound care for now until his vascular flow has been optimized.  Procedure: Excisional Debridement of Wound~stagnant Rationale: Removal of non-viable soft tissue from the wound to promote healing.  Anesthesia: none Pre-Debridement Wound Measurements: 0.9 cm x 0.9 cm x 0.3 cm Post-Debridement Wound Measurements: 1 cm x 1 cm x 0.3 cm Type of Debridement: Sharp Excisional Tissue Removed: Non-viable soft tissue Depth of Debridement: subcutaneous tissue. Technique: Sharp excisional debridement to bleeding, viable wound base.  Dressing: Dry, sterile, compression dressing. Disposition: Patient tolerated procedure well. Patient to return in 1 week for follow-up.  No follow-ups on file.     No follow-ups on file.

## 2020-04-12 ENCOUNTER — Ambulatory Visit: Payer: Medicare Other | Admitting: Podiatry

## 2020-04-12 ENCOUNTER — Other Ambulatory Visit: Payer: Self-pay

## 2020-04-12 DIAGNOSIS — I999 Unspecified disorder of circulatory system: Secondary | ICD-10-CM | POA: Diagnosis not present

## 2020-04-12 DIAGNOSIS — L97422 Non-pressure chronic ulcer of left heel and midfoot with fat layer exposed: Secondary | ICD-10-CM | POA: Diagnosis not present

## 2020-04-12 DIAGNOSIS — E08621 Diabetes mellitus due to underlying condition with foot ulcer: Secondary | ICD-10-CM

## 2020-04-19 ENCOUNTER — Encounter: Payer: Self-pay | Admitting: Podiatry

## 2020-04-19 NOTE — Progress Notes (Signed)
Subjective:  Patient ID: [name], male    DOB: [birth year],  MRN: [id number]  Chief Complaint  Patient presents with  . Foot Pain    pt is here for a diabetic ulcer f/u of the left heel. pt states that the left heel is doing alot better since the last time he was here, pt shows no signs of infection    59 y.o. male presents for wound care.  Patient presents with a complaint of left heel ulceration.  They have been doing Betadine wet-to-dry dressing changes.  No clinical signs of infection.  It appears to be doing really well.  Patient has been ambulating with a cam boot.  No acute complaints.    Review of Systems: Negative except as noted in the HPI. Denies N/V/F/Ch.  Past Medical History:  Diagnosis Date  . Anxiety   . Bipolar 1 disorder ([place])   . Depression   . High blood pressure   . High cholesterol   . Parkinson's disease ([place])   . Peripheral neuropathy   . Thyroid disease   . Tremor     Current Outpatient Medications:  .  ALPRAZolam (XANAX) 1 MG tablet, TAKE ONE TABLET BY MOUTH THREE TIMES A DAY AS NEEDED FOR ANXIETY, Disp: 90 tablet, Rfl: 3 .  amLODipine (NORVASC) 5 MG tablet, Take 5 mg by mouth daily., Disp: , Rfl:  .  aspirin (ASPIR-LOW) 81 MG EC tablet, , Disp: , Rfl:  .  atorvastatin (LIPITOR) 20 MG tablet, Take 20 mg by mouth at bedtime., Disp: , Rfl:  .  carbidopa-levodopa (SINEMET IR) 25-100 MG tablet, Take 1.5 tablets by mouth 3 (three) times daily., Disp: 400 tablet, Rfl: 3 .  Cholecalciferol (VITAMIN D3) 1000 units CAPS, Take 1,000 Units by mouth daily., Disp: , Rfl:  .  collagenase (SANTYL) ointment, Apply 1 application topically daily., Disp: 30 g, Rfl: 0 .  gabapentin (NEURONTIN) 100 MG capsule, Take 100 mg by mouth at bedtime. , Disp: , Rfl:  .  levothyroxine (SYNTHROID) 75 MCG tablet, TAKE ONE TABLET BY MOUTH EVERY MORNING, Disp: 90 tablet, Rfl: 0 .  lithium carbonate 300 MG capsule, TAKE TWO CAPSULES BY MOUTH AT BEDTIME, Disp: 60 capsule, Rfl: 4 .   risperidone (RISPERDAL) 4 MG tablet, TAKE ONE TABLET BY MOUTH EVERY NIGHT AT BEDTIME, Disp: 60 tablet, Rfl: 3 .  traZODone (DESYREL) 50 MG tablet, TAKE 1-2 TABLETS BY MOUTH AT BEDTIME AS NEEDED (Patient taking differently: 50 mg at bedtime as needed. ), Disp: 180 tablet, Rfl: 0 .  doxycycline (VIBRA-TABS) 100 MG tablet, Take 1 tablet (100 mg total) by mouth 2 (two) times daily., Disp: 30 tablet, Rfl: 1  Social History   Tobacco Use  Smoking Status Current Some Day Smoker  . Packs/day: 0.10  . Types: Cigarettes  Smokeless Tobacco Never Used  Tobacco Comment   only occasionally smokes    Allergies  Allergen Reactions  . Codeine Other (See Comments)    Agitation/mean   . 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 There is no height or weight on file to calculate BMI. Constitutional Well developed. Well nourished.  Vascular Dorsalis pedis pulses diminished bilaterally. Posterior tibial pulses diminished bilaterally. Capillary refill normal to all digits.  No cyanosis or clubbing noted. Pedal hair growth normal.  Neurologic Normal speech. Oriented to person, place, and time. Protective sensation absent  Dermatologic Wound Location: Left heel  pressure ulcer unstageable Wound Base: Necrotic eschar Peri-wound: Normal Exudate: None: wound tissue dry   Orthopedic: No pain to palpation either foot.   Radiographs: None Assessment:   1. 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 Vascular abnormality    Plan:  Patient was evaluated and treated and all questions answered.  Left heel pressure ulcer with fat layer exposed -The wound itself has appeared to regress with the eschar decreasing and the underlying wound with fat layer exposed.  I believe patient will benefit with Santyl wet-to-dry -Debridement as below. -Dressed  santyl wet-to-dry dressing, DSD. -Continue off-loading with pillows to the back of the leg as well as offloading boot.  If unable to resolve patient will benefit from cam boot immobilization -At this time patient will benefit from aggressive Betadine wet-to-dry dressing changes at least 3 times a week with monitoring closely.   -ABI PVRs were reviewed.  It appears that there is a mild concern for peripheral vascular disease to the left lower extremity.  -Patient was seen by Dr. Earleen Newport and will plan on scheduling him after his other medical issues have been resolved. -Cam boot was dispensed to completely immobilize the heel.  He does have some difficulty walking due to immobility because of his Parkinson's.  However patient states he does ambulate.  I have asked the patient ambulate only with the boot on.  Patient states understanding -I will continue local wound care for now until his vascular flow has been optimized.  Procedure: Excisional Debridement of Wound~stagnant Rationale: Removal of non-viable soft tissue from the wound to promote healing.  Anesthesia: none Pre-Debridement Wound Measurements: 0.9 cm x 0.9 cm x 0.3 cm Post-Debridement Wound Measurements: 1 cm x 1 cm x 0.3 cm Type of Debridement: Sharp Excisional Tissue Removed: Non-viable soft tissue Depth of Debridement: subcutaneous tissue. Technique: Sharp excisional debridement to bleeding, viable wound base.  Dressing: Dry, sterile, compression dressing. Disposition: Patient tolerated procedure well. Patient to return in 1 week for follow-up.  No follow-ups on file.     No follow-ups on file.

## 2020-05-10 ENCOUNTER — Other Ambulatory Visit: Payer: Self-pay

## 2020-05-10 ENCOUNTER — Ambulatory Visit: Payer: Medicare Other | Admitting: Podiatry

## 2020-05-10 DIAGNOSIS — E08621 Diabetes mellitus due to underlying condition with foot ulcer: Secondary | ICD-10-CM

## 2020-05-10 DIAGNOSIS — L97422 Non-pressure chronic ulcer of left heel and midfoot with fat layer exposed: Secondary | ICD-10-CM

## 2020-05-10 DIAGNOSIS — I999 Unspecified disorder of circulatory system: Secondary | ICD-10-CM | POA: Diagnosis not present

## 2020-05-11 ENCOUNTER — Encounter: Payer: Self-pay | Admitting: Podiatry

## 2020-05-11 NOTE — Progress Notes (Signed)
Subjective:  Patient ID: [name], male    DOB: November 14, 1961,  MRN: [id number]  Chief Complaint  Patient presents with  . Foot Pain    pt is here for a f/u of an ulcer to the left foot.    59 y.o. male presents for wound care.  Patient presents with a complaint of left heel ulceration.  They have been doing Betadine wet-to-dry dressing changes.  No clinical signs of infection.  It appears to be doing really well.  Patient has been ambulating with a cam boot.  No acute complaints. Patient to follow-up with [place] [facility] who recommends undergoing the vascular angiogram prior to doing colonoscopy. I'll reach out to Dr. Earleen Newport to l make him aware.    Review of Systems: Negative except as noted in the HPI. Denies N/V/F/Ch.  Past Medical History:  Diagnosis Date  . Anxiety   . Bipolar 1 disorder ([place])   . Depression   . High blood pressure   . High cholesterol   . Parkinson's disease ([place])   . Peripheral neuropathy   . Thyroid disease   . Tremor     Current Outpatient Medications:  .  ALPRAZolam (XANAX) 1 MG tablet, TAKE ONE TABLET BY MOUTH THREE TIMES A DAY AS NEEDED FOR ANXIETY, Disp: 90 tablet, Rfl: 3 .  amLODipine (NORVASC) 5 MG tablet, Take 5 mg by mouth daily., Disp: , Rfl:  .  aspirin (ASPIR-LOW) 81 MG EC tablet, , Disp: , Rfl:  .  atorvastatin (LIPITOR) 20 MG tablet, Take 20 mg by mouth at bedtime., Disp: , Rfl:  .  carbidopa-levodopa (SINEMET IR) 25-100 MG tablet, Take 1.5 tablets by mouth 3 (three) times daily., Disp: 400 tablet, Rfl: 3 .  Cholecalciferol (VITAMIN D3) 1000 units CAPS, Take 1,000 Units by mouth daily., Disp: , Rfl:  .  collagenase (SANTYL) ointment, Apply 1 application topically daily., Disp: 30 g, Rfl: 0 .  gabapentin (NEURONTIN) 100 MG capsule, Take 100 mg by mouth at bedtime. , Disp: , Rfl:  .  levothyroxine (SYNTHROID) 75 MCG tablet, TAKE ONE TABLET BY MOUTH EVERY MORNING, Disp: 90 tablet, Rfl: 0 .  lithium carbonate 300 MG capsule, TAKE TWO  CAPSULES BY MOUTH AT BEDTIME, Disp: 60 capsule, Rfl: 4 .  risperidone (RISPERDAL) 4 MG tablet, TAKE ONE TABLET BY MOUTH EVERY NIGHT AT BEDTIME, Disp: 60 tablet, Rfl: 3 .  traZODone (DESYREL) 50 MG tablet, TAKE 1-2 TABLETS BY MOUTH AT BEDTIME AS NEEDED (Patient taking differently: 50 mg at bedtime as needed. ), Disp: 180 tablet, Rfl: 0 .  doxycycline (VIBRA-TABS) 100 MG tablet, Take 1 tablet (100 mg total) by mouth 2 (two) times daily., Disp: 30 tablet, Rfl: 1  Social History   Tobacco Use  Smoking Status Current Some Day Smoker  . Packs/day: 0.10  . Types: Cigarettes  Smokeless Tobacco Never Used  Tobacco Comment   only occasionally smokes    Allergies  Allergen Reactions  . Codeine Other (See Comments)    Agitation/mean   . 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 There is no height or weight on file to calculate BMI. Constitutional Well developed. Well nourished.  Vascular Dorsalis pedis pulses diminished bilaterally. Posterior tibial pulses diminished bilaterally. Capillary refill normal to all digits.  No cyanosis or clubbing noted. Pedal hair growth normal.  Neurologic Normal speech. Oriented to person, place, and time. Protective sensation absent  Dermatologic Wound Location: Left heel pressure ulcer unstageable Wound Base: Necrotic eschar Peri-wound: Normal Exudate: None: wound tissue dry   Orthopedic: No pain to palpation either foot.   Radiographs: None Assessment:   1. 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 Vascular abnormality    Plan:  Patient was evaluated and treated and all questions answered.  Left heel pressure ulcer with fat layer exposed~slowly improving -Debridement as below. -Dressed santyl wet-to-dry dressing, DSD. -Continue off-loading with pillows to the back of the leg as well as  offloading boot.  If unable to resolve patient will benefit from cam boot immobilization -At this time patient will benefit from aggressive Betadine wet-to-dry dressing changes at least 3 times a week with monitoring closely.   -ABI PVRs were reviewed.  It appears that there is a mild concern for peripheral vascular disease to the left lower extremity.  -Patient had followed up with [place] [facility] who recommends that he can undergo left lower extremity angiogram with Dr. Earleen Newport prior to doing colonoscopy with them. I will reach out to Dr. Earleen Newport to see if he is able to get this patient into improve vascular flow to the left lower extremity. -Cam boot was dispensed to completely immobilize the heel.  He does have some difficulty walking due to immobility because of his Parkinson's.  However patient states he does ambulate.  I have asked the patient ambulate only with the boot on.  Patient states understanding -I will continue local wound care for now until his vascular flow has been optimized.  Procedure: Excisional Debridement of Wound~improving slowly Rationale: Removal of non-viable soft tissue from the wound to promote healing.  Anesthesia: none Pre-Debridement Wound Measurements: 0.7 cm x 0.7 cm x 0.3 cm post-Debridement Wound Measurements: 0.8 cm x 0.8 cm x 0.3 cm Type of Debridement: Sharp Excisional Tissue Removed: Non-viable soft tissue Depth of Debridement: subcutaneous tissue. Technique: Sharp excisional debridement to bleeding, viable wound base.  Dressing: Dry, sterile, compression dressing. Disposition: Patient tolerated procedure well. Patient to return in 1 week for follow-up.  No follow-ups on file.     No follow-ups on file.

## 2020-05-13 ENCOUNTER — Other Ambulatory Visit: Payer: Self-pay | Admitting: Interventional Radiology

## 2020-05-15 ENCOUNTER — Other Ambulatory Visit (HOSPITAL_COMMUNITY): Payer: Self-pay | Admitting: Interventional Radiology

## 2020-05-15 DIAGNOSIS — I70229 Atherosclerosis of native arteries of extremities with rest pain, unspecified extremity: Secondary | ICD-10-CM

## 2020-05-17 ENCOUNTER — Other Ambulatory Visit: Payer: Self-pay | Admitting: Physician Assistant

## 2020-05-23 ENCOUNTER — Encounter: Payer: Self-pay | Admitting: Neurology

## 2020-05-23 ENCOUNTER — Other Ambulatory Visit: Payer: Self-pay

## 2020-05-23 ENCOUNTER — Ambulatory Visit: Payer: Medicare Other | Admitting: Neurology

## 2020-05-23 ENCOUNTER — Telehealth: Payer: Self-pay | Admitting: *Deleted

## 2020-05-23 VITALS — BP 119/84 | HR 80 | Ht 72.0 in | Wt 140.0 lb

## 2020-05-23 DIAGNOSIS — R269 Unspecified abnormalities of gait and mobility: Secondary | ICD-10-CM | POA: Diagnosis not present

## 2020-05-23 DIAGNOSIS — I739 Peripheral vascular disease, unspecified: Secondary | ICD-10-CM

## 2020-05-23 DIAGNOSIS — G219 Secondary parkinsonism, unspecified: Secondary | ICD-10-CM | POA: Insufficient documentation

## 2020-05-23 DIAGNOSIS — R413 Other amnesia: Secondary | ICD-10-CM | POA: Diagnosis not present

## 2020-05-23 DIAGNOSIS — K117 Disturbances of salivary secretion: Secondary | ICD-10-CM

## 2020-05-23 DIAGNOSIS — G2119 Other drug induced secondary parkinsonism: Secondary | ICD-10-CM

## 2020-05-23 MED ORDER — CARBIDOPA-LEVODOPA ER 25-100 MG PO TBCR: 1.0000 | EXTENDED_RELEASE_TABLET | Freq: Every day | ORAL | 11 refills | Status: DC

## 2020-05-23 NOTE — Progress Notes (Signed)
PATIENT: [name] DOB: Mar 29, 1961  REASON FOR VISIT: follow up HISTORY FROM: patient  HISTORY OF PRESENT ILLNESS: Today 05/23/20  HISTORY  HISTORY OF PRESENT ILLNESS: He has PMHx of HTN, hypothyroidism, on supplement, bipolar disorder, taking risperidone 4 mg daily for more than ten years, lithium 325m tid, report level was within normal limit, he is also taking alprazolam 162mtid. His psychiatrist is Dr. CuCandis SchatzHe had a lot of stress recently, his son died after struck by a truck in August 2013, his wife suffered stroke in August 2011.  He complains of bilateral hand tremor over the past few years, getting worse since 2013, fairly symmtric, if he holds anything more than 5 Lbs, for more than 10 minutes, he has right arm and hand shaking, he spills water when holding a glass of water. He denies significant gait difficulty, denies loss sense of smell. He denies family history of tremor.  Lab in July 25th 2014, showed normal CBC.   MRI of the brain done 07/19/2013 without acute findings. His tremor gets worse if he is anxious.  UPDATE Sep 9th 2015:He has gradually declining functional status, he is no longer working, sit at home most of the time, is no longer driving, he was recently evaluated by his psychiatrist Dr. CuCandis Schatzn June 2015, Lithium was decreased from 90023maily to 600 every day, he is also taking Risperdal 4 mg every day He has worsening memory trouble, constipation, restless he also reported worsening depression, to the point of suicidal thought sometimes   UPDATE Oct 26 2014: Sinemet 25/100 3 times a day since Sep 2015, has been very helpful, his tremor has much improved, he continue has mild unsteady gait, recently complains of bilateral heel pain, thick skin at left medial heel.He is still taking Risperdal 4 mg every night, Xanax 1 mg half to one tablet as needed, lithium 300 mg 2 tablets every night, Synthroid 75 g once every day  UPDATE Oct 15 2015: His tremor is better with sinemet, He complains of right foot pain, planning on to have surgery soon. Today his wife is also concerned about his slow worsening gait difficulty, unsteady gait, difficulty to clear his feet from the floor, worsening bilateral upper extremity weakness, bilateral deltoid muscle wasting   UPDATE January 28 2016: EMG nerve conduction study in October 30 2015 was normal, there was no evidence of large fiber peripheral neuropathy, right lumbar or right cervical radiculopathy. He is planning on to have right foot surgery for benign mass in January 30 2016  He is still on Sinemet 25/100 mg 3 times a day, which does help his tremor  UPDATE Jul 30 2016: Patient reported increased tremor, bradykinesia, memory loss since his lithium was increased from 600 mg to 900 mg every night per family this is based on laboratory evaluation, there is no significant change on patient mood,  In addition he is also taking Risperdal 4 mg once every night, trazodone 50 mg every night, thyroid supplement. He is very frustrated about his slow movement, memory loss, bilateral hands tremor, cannot even clip his fingernails  He is [facility] PA TerDonnal Moatone ([phone] April 29 2017: He is with his wife, he has worsening memory loss, "just sit there", Watch TV, he needs help dressing, bathing, wear depends. He drinks 3 boost a day, lack of appetite, he sleeps well.  He is now back to lithium 600m34mily, He is so flat.  His wife is going to retire April 30 2017 to take care of him.  This is a sudden change compared to 01/28/2012, he was a Librarian, academic of a warehouse, this a sudden change since his son died of accident in 01-28-2012. He had diagnosis of bipolar disorder since January 28, 2008,   We have reviewed repeat CT chest without contrast on June 12th 2018: Stable right lung base subpleural mass,  I was able to talk with his psychiatrist PA Donnal Moat,, updated on the information, and also concerned about medication side effect  UPDATE Sept 11 2018:  He is accompanied by his wife at today's clinical visit, wife is apparently very frustrated about his flat affect, lack of motivations,he needs more help inhis daily activity, personal care  He is on lower dose of risperidone 19m qhs, trazodone 533mqhs, Lithium 60025mhs, he continue to take Sinemet 25/100 mg 3 times a day, which has helped his hand tremor.  I personally reviewed MRI of the brain on May 09 2017, there is generalized atrophy mild supratentorial small vessel disease Extensive laboratory evaluation showed normal ESR, CPK, CBC showed mild elevated WBC 11 point 9, vitamin D was decreased 18 point 7, CMP showed mild elevated glucose 107  Update May 23, 2020: Patient was diagnosed with secondary Parkinson's disease, due to his long-term neurotrophic medication treatment, he was started on Sinemet 25/100 mg 3 times daily since 201Mar 14, 2018hich did help his symptoms,  Personally reviewed MRI of the brain most recent was in June 2018, generalized atrophy, supratentorium small vessel disease,  His Sinemet was increased from 1-1 and half 3 times a day due to his reported increased gait abnormality, drooling,   Higher dose of Sinemet, did help his movement, but he is dealing with nonhealing left foot ulcer, has peripheral vascular disease, has pending procedure  Wife also reported that he has difficulty turning his body on bed, has to wake her up to help him using bathroom, he also had excessive drooling for more than 1 year, drooling into his plate, on his shirt, on the floor  REVIEW OF SYSTEMS: Out of a complete 14 system review of symptoms, the patient complains only of the following symptoms, and all other reviewed systems are negative.  Gait instability, falls  ALLERGIES: Allergies  Allergen Reactions  . Codeine Other (See Comments)    Agitation/mean   . Lamisil  [Terbinafine Hcl]     Lost taste buds for 2 years    HOME MEDICATIONS: Outpatient Medications Prior to Visit  Medication Sig Dispense Refill  . ALPRAZolam (XANAX) 1 MG tablet TAKE ONE TABLET BY MOUTH THREE TIMES A DAY AS NEEDED FOR ANXIETY (Patient taking differently: Take 1 mg by mouth 2 (two) times daily. ) 90 tablet 3  . amLODipine (NORVASC) 5 MG tablet Take 5 mg by mouth daily.    . aMarland Kitchenpirin (ASPIR-LOW) 81 MG EC tablet Take 81 mg by mouth daily.     . aMarland Kitchenorvastatin (LIPITOR) 20 MG tablet Take 20 mg by mouth at bedtime.    . carbidopa-levodopa (SINEMET IR) 25-100 MG tablet Take 1.5 tablets by mouth 3 (three) times daily. 400 tablet 3  . Cholecalciferol (VITAMIN D3) 1000 units CAPS Take 1,000 Units by mouth daily.    . collagenase (SANTYL) ointment Apply 1 application topically daily. 30 g 0  . doxycycline (VIBRA-TABS) 100 MG tablet Take 1 tablet (100 mg total) by mouth 2 (two) times daily. 30 tablet 1  . gabapentin (NEURONTIN) 100 MG capsule Take  100 mg by mouth at bedtime.     Marland Kitchen levothyroxine (SYNTHROID) 75 MCG tablet TAKE ONE TABLET BY MOUTH EVERY MORNING (Patient taking differently: Take 75 mcg by mouth daily. ) 90 tablet 0  . lithium carbonate 300 MG capsule TAKE TWO CAPSULES BY MOUTH AT BEDTIME (Patient taking differently: Take 600 mg by mouth at bedtime. ) 60 capsule 4  . Polyethyl Glycol-Propyl Glycol (SYSTANE OP) Place 1 drop into both eyes 4 (four) times daily as needed (dry eyes).    . polyethylene glycol (MIRALAX / GLYCOLAX) 17 g packet Take 17 g by mouth daily.    . risperidone (RISPERDAL) 4 MG tablet TAKE ONE TABLET BY MOUTH EVERY NIGHT AT BEDTIME (Patient taking differently: Take 4 mg by mouth at bedtime. ) 60 tablet 3  . traZODone (DESYREL) 50 MG tablet TAKE 1-2 TABLETS BY MOUTH AT BEDTIME AS NEEDED (Patient taking differently: Take 50 mg by mouth at bedtime. ) 180 tablet 0   No facility-administered medications prior to visit.    PAST MEDICAL HISTORY: Past Medical History:   Diagnosis Date  . Anxiety   . Bipolar 1 disorder ([place])   . Depression   . High blood pressure   . High cholesterol   . Parkinson's disease ([place])   . Peripheral neuropathy   . Thyroid disease   . Tremor     PAST SURGICAL HISTORY: Past Surgical History:  Procedure Laterality Date  . HEMORRHOID SURGERY     1980's  . IR RADIOLOGIST EVAL & MGMT  03/14/2020  . MULTIPLE TOOTH EXTRACTIONS     2016 Has top plate    FAMILY HISTORY: Family History  Problem Relation Age of Onset  . Heart attack Mother   . Liver disease Father     SOCIAL HISTORY: Social History   Socioeconomic History  . Marital status: Married    Spouse name: [name]  . Number of children: 1  . Years of education: 44  . Highest education level: Not on file  Occupational History    Comment: Not working  Tobacco Use  . Smoking status: Current Some Day Smoker    Packs/day: 0.10    Types: Cigarettes  . Smokeless tobacco: Never Used  . Tobacco comment: only occasionally smokes  Substance and Sexual Activity  . Alcohol use: No  . Drug use: No    Comment: Quit 2009  . Sexual activity: Not on file  Other Topics Concern  . Not on file  Social History Narrative   Patient lives at home with with his wife [name]). Patient is not working at this time. Patient has a 12th grade education.    Caffeine - four mountain dews daily.   Right handed.   Patient has one child.   Social Determinants of Health   Financial Resource Strain:   . Difficulty of Paying Living Expenses:   Food Insecurity:   . Worried About Charity fundraiser in the Last Year:   . Arboriculturist in the Last Year:   Transportation Needs:   . Film/video editor (Medical):   Marland Kitchen Lack of Transportation (Non-Medical):   Physical Activity:   . Days of Exercise per Week:   . Minutes of Exercise per Session:   Stress:   . Feeling of Stress :   Social Connections:   . Frequency of Communication with Friends and Family:   . Frequency of  Social Gatherings with Friends and Family:   . Attends Religious Services:   .  Active Member of Clubs or Organizations:   . Attends Archivist Meetings:   Marland Kitchen Marital Status:   Intimate Partner Violence:   . Fear of Current or Ex-Partner:   . Emotionally Abused:   Marland Kitchen Physically Abused:   . Sexually Abused:     PHYSICAL EXAM  Vitals:   05/23/20 1340  BP: 119/84  Pulse: 80  Weight: 140 lb (63.5 kg)  Height: 6' (1.829 m)   Body mass index is 18.99 kg/m.  Generalized: Well developed, in no acute distress  MMSE - Mini Mental State Exam 11/22/2019 11/02/2018 07/27/2017  Not completed: - (No Data) -  Orientation to time _0 Orientation to Place _1 Registration _2 Attention/ Calculation _3 Recall _4 Language- name 2 objects _5 Language- repeat _6 Language- follow 3 step command _7 Language- follow 3 step command-comments - took the paper in his left hand -  Language- read & follow direction _8 Write a sentence _9 Copy design _10 Total score _11 Neurological examination  Mentation: Awake, history is provided by his wife. Follows all commands speech somewhat slowed, flat affect, slow reaction time, decreased facial expression, poor dentition, excessive drooling Cranial nerve II-XII: Pupils were equal round reactive to light. Extraocular movements were full, visual field were full on confrontational test. Facial sensation and strength were normal. Head turning and shoulder shrug  were normal and symmetric. Motor: Good strength of all extremities, bradykinesia noted, mild rigidity, symmetric, Sensory: Sensory testing is intact to soft touch on all 4 extremities. No evidence of extinction is noted.  Coordination: No limb or truncal ataxia noted Gait and station: Able to rise from seated position but pushing on chairs, wearing left boot, cautious, antalgic Reflexes: Deep tendon reflexes are symmetric.   DIAGNOSTIC DATA (LABS,  IMAGING, TESTING) - I reviewed patient records, labs, notes, testing and imaging myself where available.  Lab Results  Component Value Date   WBC 12.2 (H) 10/10/2019   HGB 11.7 (L) 10/10/2019   HCT 38.5 (L) 10/10/2019   MCV 84.1 10/10/2019   PLT 202 10/10/2019      Component Value Date/Time   NA 141 10/10/2019 1159   NA 143 04/29/2017 0827   K 3.5 10/10/2019 1159   CL 104 10/10/2019 1159   CO2 24 10/10/2019 1159   GLUCOSE 98 10/10/2019 1159   BUN 9 10/10/2019 1159   BUN 10 04/29/2017 0827   CREATININE 1.03 10/10/2019 1159   CALCIUM 9.2 10/10/2019 1159   PROT 7.3 04/29/2017 0827   ALBUMIN 4.7 04/29/2017 0827   AST 12 04/29/2017 0827   ALT 14 04/29/2017 0827   ALKPHOS 116 04/29/2017 0827   BILITOT 0.5 04/29/2017 0827   GFRNONAA >60 10/10/2019 1159   GFRAA >60 10/10/2019 1159   No results found for: CHOL, HDL, LDLCALC, LDLDIRECT, TRIG, CHOLHDL No results found for: HGBA1C Lab Results  Component Value Date   VITAMINB12 782 04/29/2017   Lab Results  Component Value Date   TSH 4.410 04/29/2017      ASSESSMENT AND PLAN 59 y.o. year old male   Secondary parkinsonism  Fairly symmetric, history of long-term psychiatric medication treatment, including Risperdal, lithium,  Was treated with Sinemet 25/100 mg since 2018, did report improvement, more gait improvement with higher dose of 1.5 mg 3 times  a day, I have suggested his wife to move his medication schedule to morning, every 4 hours, last dose will be at evening time  He also complains of difficulty turning at bedtime, add on Sinemet CR 25/100 mg every night  Memory loss Excessive drooling Gait abnormality  Preauthorization Botox 100 unit injection for hypersialorrhea  ( started May 23, 2020) Peripheral vascular disease  He has vascular risk factor of sedentary lifestyle, hyperlipidemia, encourage increased water intake, aspirin 81 mg daily,  May consider refer him to physical therapy after his foot ulcer  healed    Marcial Pacas, M.D. Ph.D.  [facility] [place], [place] [address] Phone: [phone] Fax:      [phone]

## 2020-05-23 NOTE — Telephone Encounter (Addendum)
From Dr. Krista Blue - Return to clinic in August 2021 for Botox injection for hypersialorrhea, initiate the process on May 23, 2020, asking for 100 units of Botox a.  Consent form signed.

## 2020-05-27 ENCOUNTER — Other Ambulatory Visit: Payer: Self-pay | Admitting: Radiology

## 2020-05-27 NOTE — Telephone Encounter (Addendum)
I called [employer] [phone]) and spoke with Claiborne Billings to see if PA is needed for [id number] and [id number] (Dx K11.7). She states No PA is required. Reference #[id number].

## 2020-05-28 ENCOUNTER — Other Ambulatory Visit: Payer: Self-pay

## 2020-05-28 ENCOUNTER — Ambulatory Visit (HOSPITAL_COMMUNITY)
Admission: RE | Admit: 2020-05-28 | Discharge: 2020-05-28 | Disposition: A | Discharge status: Home / Self Care | Payer: Medicare Other | Source: Ambulatory Visit | Attending: Interventional Radiology | Admitting: Interventional Radiology

## 2020-05-28 ENCOUNTER — Other Ambulatory Visit (HOSPITAL_COMMUNITY): Payer: Self-pay | Admitting: Interventional Radiology

## 2020-05-28 ENCOUNTER — Encounter (HOSPITAL_COMMUNITY): Payer: Self-pay

## 2020-05-28 DIAGNOSIS — Z79890 Hormone replacement therapy (postmenopausal): Secondary | ICD-10-CM | POA: Diagnosis not present

## 2020-05-28 DIAGNOSIS — I70244 Atherosclerosis of native arteries of left leg with ulceration of heel and midfoot: Secondary | ICD-10-CM | POA: Insufficient documentation

## 2020-05-28 DIAGNOSIS — E7800 Pure hypercholesterolemia, unspecified: Secondary | ICD-10-CM | POA: Diagnosis not present

## 2020-05-28 DIAGNOSIS — E079 Disorder of thyroid, unspecified: Secondary | ICD-10-CM | POA: Diagnosis not present

## 2020-05-28 DIAGNOSIS — L97429 Non-pressure chronic ulcer of left heel and midfoot with unspecified severity: Secondary | ICD-10-CM | POA: Insufficient documentation

## 2020-05-28 DIAGNOSIS — E785 Hyperlipidemia, unspecified: Secondary | ICD-10-CM | POA: Insufficient documentation

## 2020-05-28 DIAGNOSIS — G629 Polyneuropathy, unspecified: Secondary | ICD-10-CM | POA: Diagnosis not present

## 2020-05-28 DIAGNOSIS — F319 Bipolar disorder, unspecified: Secondary | ICD-10-CM | POA: Diagnosis not present

## 2020-05-28 DIAGNOSIS — I10 Essential (primary) hypertension: Secondary | ICD-10-CM | POA: Diagnosis not present

## 2020-05-28 DIAGNOSIS — Z79899 Other long term (current) drug therapy: Secondary | ICD-10-CM | POA: Diagnosis not present

## 2020-05-28 DIAGNOSIS — Z7982 Long term (current) use of aspirin: Secondary | ICD-10-CM | POA: Diagnosis not present

## 2020-05-28 DIAGNOSIS — G20 Parkinson's disease: Secondary | ICD-10-CM | POA: Insufficient documentation

## 2020-05-28 DIAGNOSIS — I70229 Atherosclerosis of native arteries of extremities with rest pain, unspecified extremity: Secondary | ICD-10-CM

## 2020-05-28 DIAGNOSIS — F17210 Nicotine dependence, cigarettes, uncomplicated: Secondary | ICD-10-CM | POA: Insufficient documentation

## 2020-05-28 HISTORY — PX: IR ANGIOGRAM EXTREMITY LEFT: IMG651

## 2020-05-28 HISTORY — PX: IR US GUIDE VASC ACCESS LEFT: IMG2389

## 2020-05-28 LAB — BASIC METABOLIC PANEL
Anion gap: 11 (ref 5–15)
BUN: 12 mg/dL (ref 6–20)
CO2: 23 mmol/L (ref 22–32)
Calcium: 9.5 mg/dL (ref 8.9–10.3)
Chloride: 105 mmol/L (ref 98–111)
Creatinine, Ser: 0.99 mg/dL (ref 0.61–1.24)
GFR calc Af Amer: >60 mL/min (ref >60)
GFR calc non Af Amer: >60 mL/min (ref >60)
Glucose, Bld: 104 mg/dL — ABNORMAL HIGH (ref 70–99)
Potassium: 3.9 mmol/L (ref 3.5–5.1)
Sodium: 139 mmol/L (ref 135–145)

## 2020-05-28 LAB — CBC
HCT: 43.9 % (ref 39.0–52.0)
Hemoglobin: 14.1 g/dL (ref 13.0–17.0)
MCH: 29.4 pg (ref 26.0–34.0)
MCHC: 32.1 g/dL (ref 30.0–36.0)
MCV: 91.6 fL (ref 80.0–100.0)
Platelets: 229 10*3/uL (ref 150–400)
RBC: 4.79 MIL/uL (ref 4.22–5.81)
RDW: 12.8 % (ref 11.5–15.5)
WBC: 11 10*3/uL — ABNORMAL HIGH (ref 4.0–10.5)
nRBC: 0 % (ref 0.0–0.2)

## 2020-05-28 LAB — PROTIME-INR
INR: 1.1 (ref 0.8–1.2)
Prothrombin Time: 13.8 seconds (ref 11.4–15.2)

## 2020-05-28 MED ORDER — CLOPIDOGREL BISULFATE 75 MG PO TABS
ORAL_TABLET | ORAL | Status: AC
  Filled 2020-05-28: qty 4

## 2020-05-28 MED ORDER — CLOPIDOGREL BISULFATE 75 MG PO TABS
300.0000 mg | ORAL_TABLET | Freq: Once | ORAL | Status: AC
  Administered 2020-05-28: 300 mg via ORAL

## 2020-05-28 MED ORDER — ASPIRIN 325 MG PO TABS
325.0000 mg | ORAL_TABLET | Freq: Once | ORAL | Status: AC
  Administered 2020-05-28: 325 mg via ORAL

## 2020-05-28 MED ORDER — LIDOCAINE HCL 1 % IJ SOLN
INTRAMUSCULAR | Status: AC
  Filled 2020-05-28: qty 20

## 2020-05-28 MED ORDER — MIDAZOLAM HCL 2 MG/2ML IJ SOLN
INTRAMUSCULAR | Status: AC
  Filled 2020-05-28: qty 2

## 2020-05-28 MED ORDER — FENTANYL CITRATE (PF) 100 MCG/2ML IJ SOLN
INTRAMUSCULAR | Status: AC
  Filled 2020-05-28: qty 2

## 2020-05-28 MED ORDER — FENTANYL CITRATE (PF) 100 MCG/2ML IJ SOLN
INTRAMUSCULAR | Status: AC | PRN
  Administered 2020-05-28: 50 ug via INTRAVENOUS

## 2020-05-28 MED ORDER — HEPARIN SODIUM (PORCINE) 1000 UNIT/ML IJ SOLN
INTRAMUSCULAR | Status: AC
  Filled 2020-05-28: qty 1

## 2020-05-28 MED ORDER — IODIXANOL 320 MG/ML IV SOLN
100.0000 mL | Freq: Once | INTRAVENOUS | Status: AC | PRN
  Administered 2020-05-28: 70 mL via INTRAVENOUS

## 2020-05-28 MED ORDER — MIDAZOLAM HCL 2 MG/2ML IJ SOLN
INTRAMUSCULAR | Status: AC | PRN
  Administered 2020-05-28: 1 mg via INTRAVENOUS

## 2020-05-28 MED ORDER — ASPIRIN 325 MG PO TABS
ORAL_TABLET | ORAL | Status: AC
  Filled 2020-05-28: qty 1

## 2020-05-28 MED ORDER — LIDOCAINE HCL (PF) 1 % IJ SOLN
INTRAMUSCULAR | Status: AC | PRN
  Administered 2020-05-28: 10 mL

## 2020-05-28 MED ORDER — SODIUM CHLORIDE 0.9 % IV SOLN: INTRAVENOUS | Status: DC

## 2020-05-28 NOTE — H&P (Signed)
Chief Complaint: Patient was seen in consultation today for left lower extremity angiogram with possible intervention.  Referring Physician(s): Boneta Lucks  Supervising Physician: Corrie Mckusick  Patient Status: [facility] - Out-pt  History of Present Illness: [name] is a 59 y.o. male with a past medical history significant for anxiety, bipolar disorder, depression, Parkinson's disease, HTN, HLD, peripheral neuropathy, PAD and poorly healing left heel wound who presents today for a left lower extremity angiogram with possible intervention. [name] was seen in consultation by Dr. Earleen Newport on 03/14/20 - please refer to this consult note for full details. Briefly, [name] developed a painless left heel wound early this year and he subsequently underwent debridement and oral antibiotic therapy. During consultation he was noted to have a left heel wound compatible with Rutherford 6 category symptoms of CLI/CLTI and LLE tibial disease with a potentially falsely elevated left ABI. After thorough discussion decision was made to proceed with LLE angiogram and possible intervention for which he presents today.  [name] presents with his wife today who provides the majority of the history. She reports that the left heel wound is about the same as usual, he is not currently on any antibiotics and the wound does not seem to bother him. He was planned to have a colonoscopy prior to our procedure however after speaking with Dr. Johney Maine he decided it would be best to have the colonoscopy after today's procedure. He continues to have bright red rectal bleeding which is the same as it has been since it started. He is planned to have botox soon to hopefully help control some drooling which he is looking forward to. He tells me their anniversary is soon and they have been married for 42 years. He and his wife both state understanding of the procedure today and are agreeable to proceed as planned.  Past Medical  History:  Diagnosis Date  . Anxiety   . Bipolar 1 disorder ([place])   . Depression   . High blood pressure   . High cholesterol   . Parkinson's disease ([place])   . Peripheral neuropathy   . Thyroid disease   . Tremor     Past Surgical History:  Procedure Laterality Date  . HEMORRHOID SURGERY     1980's  . IR RADIOLOGIST EVAL & MGMT  03/14/2020  . MULTIPLE TOOTH EXTRACTIONS     2016 Has top plate    Allergies: Codeine and Lamisil [terbinafine hcl]  Medications: Prior to Admission medications   Medication Sig Start Date End Date Taking? Authorizing Provider  ALPRAZolam (XANAX) 1 MG tablet TAKE ONE TABLET BY MOUTH THREE TIMES A DAY AS NEEDED FOR ANXIETY Patient taking differently: Take 1 mg by mouth 2 (two) times daily.  02/19/20  Yes Hurst, Helene Kelp T, PA-C  amLODipine (NORVASC) 5 MG tablet Take 5 mg by mouth daily.   Yes [provider]  aspirin (ASPIR-LOW) 81 MG EC tablet Take 81 mg by mouth daily.  03/19/20  Yes [provider]  atorvastatin (LIPITOR) 20 MG tablet Take 20 mg by mouth at bedtime.   Yes [provider]  carbidopa-levodopa (SINEMET IR) 25-100 MG tablet Take 1.5 tablets by mouth 3 (three) times daily. 11/22/19  Yes Suzzanne Cloud, NP  Carbidopa-Levodopa ER (SINEMET CR) 25-100 MG tablet controlled release Take 1 tablet by mouth at bedtime. 05/23/20  Yes Marcial Pacas, MD  Cholecalciferol (VITAMIN D3) 1000 units CAPS Take 1,000 Units by mouth daily.   Yes [provider]  collagenase (SANTYL) ointment Apply 1 application topically daily. 03/08/20  Yes Felipa Furnace, DPM  gabapentin (NEURONTIN) 100 MG capsule Take 100 mg by mouth at bedtime.  10/16/19  Yes [provider]  levothyroxine (SYNTHROID) 75 MCG tablet TAKE ONE TABLET BY MOUTH EVERY MORNING Patient taking differently: Take 75 mcg by mouth daily.  05/20/20  Yes Hurst, Helene Kelp T, PA-C  lithium carbonate 300 MG capsule TAKE TWO CAPSULES BY MOUTH AT BEDTIME Patient taking differently:  Take 600 mg by mouth at bedtime.  10/25/19  Yes Hurst, Helene Kelp T, PA-C  Polyethyl Glycol-Propyl Glycol (SYSTANE OP) Place 1 drop into both eyes 4 (four) times daily as needed (dry eyes).   Yes [provider]  polyethylene glycol (MIRALAX / GLYCOLAX) 17 g packet Take 17 g by mouth daily.   Yes [provider]  risperidone (RISPERDAL) 4 MG tablet TAKE ONE TABLET BY MOUTH EVERY NIGHT AT BEDTIME Patient taking differently: Take 4 mg by mouth at bedtime.  02/19/20  Yes Hurst, Helene Kelp T, PA-C  traZODone (DESYREL) 50 MG tablet TAKE 1-2 TABLETS BY MOUTH AT BEDTIME AS NEEDED Patient taking differently: Take 50 mg by mouth at bedtime.  11/23/19  Yes Hurst, Dorothea Glassman, PA-C  doxycycline (VIBRA-TABS) 100 MG tablet Take 1 tablet (100 mg total) by mouth 2 (two) times daily. 02/09/20   Wallene Huh, DPM     Family History  Problem Relation Age of Onset  . Heart attack Mother   . Liver disease Father     Social History   Socioeconomic History  . Marital status: Married    Spouse name: [name]  . Number of children: 1  . Years of education: 27  . Highest education level: Not on file  Occupational History    Comment: Not working  Tobacco Use  . Smoking status: Current Some Day Smoker    Packs/day: 0.10    Types: Cigarettes  . Smokeless tobacco: Never Used  . Tobacco comment: only occasionally smokes  Substance and Sexual Activity  . Alcohol use: No  . Drug use: No    Comment: Quit 2009  . Sexual activity: Not on file  Other Topics Concern  . Not on file  Social History Narrative   Patient lives at home with with [name]). Patient is not working at this time. Patient has a 12th grade education.    Caffeine - four mountain dews daily.   Right handed.   Patient has one child.   Social Determinants of Health   Financial Resource Strain:   . Difficulty of Paying Living Expenses:   Food Insecurity:   . Worried About Charity fundraiser in the Last Year:   . Academic librarian in the Last Year:   Transportation Needs:   . Film/video editor (Medical):   Marland Kitchen Lack of Transportation (Non-Medical):   Physical Activity:   . Days of Exercise per Week:   . Minutes of Exercise per Session:   Stress:   . Feeling of Stress :   Social Connections:   . Frequency of Communication with Friends and Family:   . Frequency of Social Gatherings with Friends and Family:   . Attends Religious Services:   . Active Member of Clubs or Organizations:   . Attends Archivist Meetings:   Marland Kitchen Marital Status:      Review of Systems: A 12 point ROS discussed and pertinent positives are indicated in the HPI above.  All other  systems are negative.  Review of Systems  Constitutional: Negative for activity change, appetite change, chills and fever.  HENT: Negative for nosebleeds.   Respiratory: Negative for cough and shortness of breath.   Cardiovascular: Negative for chest pain.  Gastrointestinal: Positive for anal bleeding. Negative for abdominal pain, diarrhea, nausea and vomiting.  Genitourinary: Negative for hematuria.  Musculoskeletal: Positive for gait problem (uses cane/walker at home). Negative for arthralgias, back pain and myalgias.  Skin: Positive for wound (left heel). Negative for color change and rash.  Neurological: Positive for tremors and weakness. Negative for dizziness, numbness and headaches.  Psychiatric/Behavioral: Positive for confusion (at times 2/2 Parkinson's).    Vital Signs: BP 125/87   Pulse 69   Temp 97.8 F (36.6 C) (Oral)   Resp 16   Ht 6' (1.829 m)   Wt 140 lb (63.5 kg)   SpO2 99%   BMI 18.99 kg/m   Physical Exam Vitals reviewed.  Constitutional:      General: He is not in acute distress. HENT:     Head: Normocephalic.     Mouth/Throat:     Mouth: Mucous membranes are moist.     Pharynx: Oropharynx is clear. No oropharyngeal exudate or posterior oropharyngeal erythema.     Comments: (+) full upper  denture Cardiovascular:     Rate and Rhythm: Normal rate and regular rhythm.  Pulmonary:     Effort: Pulmonary effort is normal.     Breath sounds: Normal breath sounds.  Abdominal:     General: There is no distension.     Palpations: Abdomen is soft.     Tenderness: There is no abdominal tenderness.  Musculoskeletal:     Comments: (+) LLE heel wound with scabbing, no obvious necrosis. No foul odor or purulent drainage. No tender to palpation. Dry skin on feet.  Skin:    General: Skin is warm and dry.  Neurological:     Mental Status: He is alert. Mental status is at baseline.  Psychiatric:        Mood and Affect: Mood normal.        Behavior: Behavior normal.        Thought Content: Thought content normal.        Judgment: Judgment normal.      MD Evaluation Airway: WNL Heart: WNL Abdomen: WNL Chest/ Lungs: WNL ASA  Classification: 3 Mallampati/Airway Score: Two   Imaging: No results found.  Labs:  CBC: Recent Labs    10/10/19 1159  WBC 12.2*  HGB 11.7*  HCT 38.5*  PLT 202    COAGS: No results for input(s): INR, APTT in the last 8760 hours.  BMP: Recent Labs    10/10/19 1159  NA 141  K 3.5  CL 104  CO2 24  GLUCOSE 98  BUN 9  CALCIUM 9.2  CREATININE 1.03  GFRNONAA >60  GFRAA >60    LIVER FUNCTION TESTS: No results for input(s): BILITOT, AST, ALT, ALKPHOS, PROT, ALBUMIN in the last 8760 hours.  TUMOR MARKERS: No results for input(s): AFPTM, CEA, CA199, CHROMGRNA in the last 8760 hours.  Assessment and Plan:  59 y/o M with poorly healing left heel wound found to be consistent with Rutherford 6 category symptoms of CLI/CLTI who presents today for a left lower extremity angiogram with possible intervention as previously discussed in consultation with Dr. Earleen Newport.  Patient has been NPO since 10 pm last night except for a few sips of water with his AM medication at 6 am today,  no current anticoagulation/antiplatelet. Afebrile, WBC 11.0, hgb 14.1,  plt 229, INR 1.1, creatinine 0.99.   Risks and benefits of left lower extremity arteriogram with intervention were discussed with the patient including, but not limited to bleeding, infection, vascular injury, contrast induced renal failure, stroke, reperfusion hemorrhage, or even death.  This interventional procedure involves the use of X-rays and because of the nature of the planned procedure, it is possible that we will have prolonged use of X-ray fluoroscopy. Potential radiation risks to you include (but are not limited to) the following: - A slightly elevated risk for cancer  several years later in life. This risk is typically less than 0.5% percent. This risk is low in comparison to the normal incidence of human cancer, which is 33% for women and 50% for men according to the [place]. - Radiation induced injury can include skin redness, resembling a rash, tissue breakdown / ulcers and hair loss (which can be temporary or permanent).  The likelihood of either of these occurring depends on the difficulty of the procedure and whether you are sensitive to radiation due to previous procedures, disease, or genetic conditions.  IF your procedure requires a prolonged use of radiation, you will be notified and given written instructions for further action.  It is your responsibility to monitor the irradiated area for the 2 weeks following the procedure and to notify your physician if you are concerned that you have suffered a radiation induced injury.    All of the patient's questions were answered, patient is agreeable to proceed.  Consent signed and in chart.  Thank you for this interesting consult.  I greatly enjoyed meeting Sampson Self and look forward to participating in their care.  A copy of this report was sent to the requesting provider on this date.  Electronically Signed: Joaquim Nam, PA-C 05/28/2020, 7:37 AM   I spent a total of  25 Minutes in face to face in  clinical consultation, greater than 50% of which was counseling/coordinating care for left lower extremity angiogram.

## 2020-05-28 NOTE — Procedures (Signed)
Interventional Radiology Procedure Note  Procedure: US guided access left CFA, for left lower extremity angiogram.   Findings: No significant stenosis of the femoral-popliteal segment.  3 tibial arteries are patent proximally.  The AT and the PT are normal caliber to the ankle.  The peroneal artery is small caliber throughout.  There is no target for intervention.   Complications: None  Recommendations:  - Left hip straight x 4 hrs - maximal medical care - continue excellent wound care - float heels when at rest - Do not submerge for 7 days - Routine care  - surveillance ABI/non-invasive is indicated in 12 months - Follow up with Dr. Earleen Newport in clinic in 3-4 weeks for post op  Signed,  Dulcy Fanny. Earleen Newport, DO

## 2020-05-28 NOTE — Discharge Instructions (Addendum)
Angiogram, Care After °This sheet gives you information about how to care for yourself after your procedure. Your health care provider may also give you more specific instructions. If you have problems or questions, contact your health care provider. °What can I expect after the procedure? °After the procedure, it is common to have bruising and tenderness at the catheter insertion area. °Follow these instructions at home: °Insertion site care °· Follow instructions from your health care provider about how to take care of your insertion site. Make sure you: °? Wash your hands with soap and water before you change your bandage (dressing). If soap and water are not available, use hand sanitizer. °? Change your dressing as told by your health care provider. °? Leave stitches (sutures), skin glue, or adhesive strips in place. These skin closures may need to stay in place for 2 weeks or longer. If adhesive strip edges start to loosen and curl up, you may trim the loose edges. Do not remove adhesive strips completely unless your health care provider tells you to do that. °· Do not take baths, swim, or use a hot tub until your health care provider approves. °· You may shower 24-48 hours after the procedure or as told by your health care provider. °? Gently wash the site with plain soap and water. °? Pat the area dry with a clean towel. °? Do not rub the site. This may cause bleeding. °· Do not apply powder or lotion to the site. Keep the site clean and dry. °· Check your insertion site every day for signs of infection. Check for: °? Redness, swelling, or pain. °? Fluid or blood. °? Warmth. °? Pus or a bad smell. °Activity °· Rest as told by your health care provider, usually for 1-2 days. °· Do not lift anything that is heavier than 10 lbs. (4.5 kg) or as told by your health care provider. °· Do not drive for 24 hours if you were given a medicine to help you relax (sedative). °· Do not drive or use heavy machinery while  taking prescription pain medicine. °General instructions ° °· Return to your normal activities as told by your health care provider, usually in about a week. Ask your health care provider what activities are safe for you. °· If the catheter site starts bleeding, lie flat and put pressure on the site. If the bleeding does not stop, get help right away. This is a medical emergency. °· Drink enough fluid to keep your urine clear or pale yellow. This helps flush the contrast dye from your body. °· Take over-the-counter and prescription medicines only as told by your health care provider. °· Keep all follow-up visits as told by your health care provider. This is important. °Contact a health care provider if: °· You have a fever or chills. °· You have redness, swelling, or pain around your insertion site. °· You have fluid or blood coming from your insertion site. °· The insertion site feels warm to the touch. °· You have pus or a bad smell coming from your insertion site. °· You have bruising around the insertion site. °· You notice blood collecting in the tissue around the catheter site (hematoma). The hematoma may be painful to the touch. °Get help right away if: °· You have severe pain at the catheter insertion area. °· The catheter insertion area swells very fast. °· The catheter insertion area is bleeding, and the bleeding does not stop when you hold steady pressure on the area. °·   The area near or just beyond the catheter insertion site becomes pale, cool, tingly, or numb. These symptoms may represent a serious problem that is an emergency. Do not wait to see if the symptoms will go away. Get medical help right away. Call your local emergency services (911 in the [place] Do not drive yourself to the hospital. Summary  After the procedure, it is common to have bruising and tenderness at the catheter insertion area.  After the procedure, it is important to rest and drink plenty of fluids.  Do not take baths,  swim, or use a hot tub until your health care provider says it is okay to do so. You may shower 24-48 hours after the procedure or as told by your health care provider.  If the catheter site starts bleeding, lie flat and put pressure on the site. If the bleeding does not stop, get help right away. This is a medical emergency. This information is not intended to replace advice given to you by your health care provider. Make sure you discuss any questions you have with your health care provider. Document Revised: 10/15/2017 Document Reviewed: 10/07/2016 Elsevier Patient Education  2020 Else[employer] Inc. Moderate Conscious Sedation, Adult Sedation is the use of medicines to promote relaxation and relieve discomfort and anxiety. Moderate conscious sedation is a type of sedation. Under moderate conscious sedation, you are less alert than normal, but you are still able to respond to instructions, touch, or both. Moderate conscious sedation is used during short medical and dental procedures. It is milder than deep sedation, which is a type of sedation under which you cannot be easily woken up. It is also milder than general anesthesia, which is the use of medicines to make you unconscious. Moderate conscious sedation allows you to return to your regular activities sooner. Tell a health care provider about:  Any allergies you have.  All medicines you are taking, including vitamins, herbs, eye drops, creams, and over-the-counter medicines.  Use of steroids (by mouth or creams).  Any problems you or family members have had with sedatives and anesthetic medicines.  Any blood disorders you have.  Any surgeries you have had.  Any medical conditions you have, such as sleep apnea.  Whether you are pregnant or may be pregnant.  Any use of cigarettes, alcohol, marijuana, or street drugs. What are the risks? Generally, this is a safe procedure. However, problems may occur, including:  Getting too much  medicine (oversedation).  Nausea.  Allergic reaction to medicines.  Trouble breathing. If this happens, a breathing tube may be used to help with breathing. It will be removed when you are awake and breathing on your own.  Heart trouble.  Lung trouble. What happens before the procedure? Staying hydrated Follow instructions from your health care provider about hydration, which may include:  Up to 2 hours before the procedure - you may continue to drink clear liquids, such as water, clear fruit juice, black coffee, and plain tea. Eating and drinking restrictions Follow instructions from your health care provider about eating and drinking, which may include:  8 hours before the procedure - stop eating heavy meals or foods such as meat, fried foods, or fatty foods.  6 hours before the procedure - stop eating light meals or foods, such as toast or cereal.  6 hours before the procedure - stop drinking milk or drinks that contain milk.  2 hours before the procedure - stop drinking clear liquids. Medicine Ask your health care provider about:    Changing or stopping your regular medicines. This is especially important if you are taking diabetes medicines or blood thinners.  Taking medicines such as aspirin and ibuprofen. These medicines can thin your blood. Do not take these medicines before your procedure if your health care provider instructs you not to.  Tests and exams  You will have a physical exam.  You may have blood tests done to show: ? How well your kidneys and liver are working. ? How well your blood can clot. General instructions  Plan to have someone take you home from the hospital or clinic.  If you will be going home right after the procedure, plan to have someone with you for 24 hours. What happens during the procedure?  An IV tube will be inserted into one of your veins.  Medicine to help you relax (sedative) will be given through the IV tube.  The medical or  dental procedure will be performed. What happens after the procedure?  Your blood pressure, heart rate, breathing rate, and blood oxygen level will be monitored often until the medicines you were given have worn off.  Do not drive for 24 hours. This information is not intended to replace advice given to you by your health care provider. Make sure you discuss any questions you have with your health care provider. Document Revised: 10/15/2017 Document Reviewed: 02/22/2016 Elsevier Patient Education  2020 [employer] Inc.  

## 2020-06-07 ENCOUNTER — Encounter: Payer: Self-pay | Admitting: Podiatry

## 2020-06-07 ENCOUNTER — Ambulatory Visit: Payer: Medicare Other | Admitting: Podiatry

## 2020-06-07 ENCOUNTER — Other Ambulatory Visit: Payer: Self-pay

## 2020-06-07 ENCOUNTER — Other Ambulatory Visit: Payer: Self-pay | Admitting: Interventional Radiology

## 2020-06-07 DIAGNOSIS — E08621 Diabetes mellitus due to underlying condition with foot ulcer: Secondary | ICD-10-CM

## 2020-06-07 DIAGNOSIS — L97402 Non-pressure chronic ulcer of unspecified heel and midfoot with fat layer exposed: Secondary | ICD-10-CM

## 2020-06-07 DIAGNOSIS — L97422 Non-pressure chronic ulcer of left heel and midfoot with fat layer exposed: Secondary | ICD-10-CM | POA: Diagnosis not present

## 2020-06-07 MED ORDER — SANTYL 250 UNIT/GM EX OINT
1.0000 | TOPICAL_OINTMENT | Freq: Every day | CUTANEOUS | 0 refills | Status: DC
Start: 2020-06-07 — End: 2021-01-08

## 2020-06-07 NOTE — Progress Notes (Signed)
Subjective:  Patient ID: [name], male    DOB: 07-May-1961,  MRN: [id number]  Chief Complaint  Patient presents with  . Follow-up    Pt states he the wound is he same but not getting worse- vascular study was done -report is in epic- overall doing well - further evaluation     59 y.o. male presents for wound care.  Patient presents with a complaint of left heel ulceration.    No clinical signs of infection.  It appears to be doing really well.  Patient has been ambulating with a cam boot.  No acute complaints.  Patient recently had the angiogram done.  Patient states is doing well.  They have been doing Santyl wet-to-dry dressing changes.  Patient underwent left lower extremity angiogram for which the results were \\No  significant stenosis of the femoral-popliteal segment.  3 tibial arteries are patent proximally.  The AT and the PT are normal caliber to the ankle.  The peroneal artery is small caliber throughout.  There is no target for intervention.    Review of Systems: Negative except as noted in the HPI. Denies N/V/F/Ch.  Past Medical History:  Diagnosis Date  . Anxiety   . Bipolar 1 disorder ([place])   . Depression   . High blood pressure   . High cholesterol   . Parkinson's disease ([place])   . Peripheral neuropathy   . Thyroid disease   . Tremor     Current Outpatient Medications:  .  ALPRAZolam (XANAX) 1 MG tablet, TAKE ONE TABLET BY MOUTH THREE TIMES A DAY AS NEEDED FOR ANXIETY (Patient taking differently: Take 1 mg by mouth 2 (two) times daily. ), Disp: 90 tablet, Rfl: 3 .  amLODipine (NORVASC) 5 MG tablet, Take 5 mg by mouth daily., Disp: , Rfl:  .  aspirin (ASPIR-LOW) 81 MG EC tablet, Take 81 mg by mouth daily. , Disp: , Rfl:  .  atorvastatin (LIPITOR) 20 MG tablet, Take 20 mg by mouth at bedtime., Disp: , Rfl:  .  carbidopa-levodopa (SINEMET IR) 25-100 MG tablet, Take 1.5 tablets by mouth 3 (three) times daily., Disp: 400 tablet, Rfl: 3 .  Carbidopa-Levodopa ER  (SINEMET CR) 25-100 MG tablet controlled release, Take 1 tablet by mouth at bedtime., Disp: 30 tablet, Rfl: 11 .  Cholecalciferol (VITAMIN D3) 1000 units CAPS, Take 1,000 Units by mouth daily., Disp: , Rfl:  .  collagenase (SANTYL) ointment, Apply 1 application topically daily., Disp: 30 g, Rfl: 0 .  collagenase (SANTYL) ointment, Apply 1 application topically daily., Disp: 15 g, Rfl: 0 .  doxycycline (VIBRA-TABS) 100 MG tablet, Take 1 tablet (100 mg total) by mouth 2 (two) times daily., Disp: 30 tablet, Rfl: 1 .  gabapentin (NEURONTIN) 100 MG capsule, Take 100 mg by mouth at bedtime. , Disp: , Rfl:  .  levothyroxine (SYNTHROID) 75 MCG tablet, TAKE ONE TABLET BY MOUTH EVERY MORNING (Patient taking differently: Take 75 mcg by mouth daily. ), Disp: 90 tablet, Rfl: 0 .  lithium carbonate 300 MG capsule, TAKE TWO CAPSULES BY MOUTH AT BEDTIME (Patient taking differently: Take 600 mg by mouth at bedtime. ), Disp: 60 capsule, Rfl: 4 .  Polyethyl Glycol-Propyl Glycol (SYSTANE OP), Place 1 drop into both eyes 4 (four) times daily as needed (dry eyes)., Disp: , Rfl:  .  polyethylene glycol (MIRALAX / GLYCOLAX) 17 g packet, Take 17 g by mouth daily., Disp: , Rfl:  .  risperidone (RISPERDAL) 4 MG tablet, TAKE ONE TABLET BY MOUTH EVERY  NIGHT AT BEDTIME (Patient taking differently: Take 4 mg by mouth at bedtime. ), Disp: 60 tablet, Rfl: 3 .  traZODone (DESYREL) 50 MG tablet, TAKE 1-2 TABLETS BY MOUTH AT BEDTIME AS NEEDED (Patient taking differently: Take 50 mg by mouth at bedtime. ), Disp: 180 tablet, Rfl: 0  Social History   Tobacco Use  Smoking Status Former Smoker  . Packs/day: 0.10  . Types: Cigarettes  . Quit date: 05/29/2019  . Years since quitting: 1.0  Smokeless Tobacco Never Used  Tobacco Comment   only occasionally smokes    Allergies  Allergen Reactions  . Codeine Other (See Comments)    Agitation/mean   . Lamisil [Terbinafine Hcl]     Lost taste buds for 2 years   Objective:  There  were no vitals filed for this visit. There is no height or weight on file to calculate BMI. Constitutional Well developed. Well nourished.  Vascular Dorsalis pedis pulses diminished bilaterally. Posterior tibial pulses diminished bilaterally. Capillary refill normal to all digits.  No cyanosis or clubbing noted. Pedal hair growth normal.  Neurologic Normal speech. Oriented to person, place, and time. Protective sensation absent  Dermatologic Wound Location: Left heel pressure ulcer fat layer exposed Wound Base: Necrotic eschar Peri-wound: Normal Exudate: None: wound tissue dry   Orthopedic: No pain to palpation either foot.   Radiographs: None Assessment:   1. 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  Left heel pressure ulcer with fat layer exposed~slowly improving -Debridement as below. -Dressed santyl wet-to-dry dressing, DSD. -Continue off-loading with pillows to the back of the leg as well as offloading boot.  If unable to resolve patient will benefit from cam boot immobilization -At this time patient will benefit from aggressive Betadine wet-to-dry dressing changes at least 3 times a week with monitoring closely.   -Patient has small vessel disease past the ankle level which is making the heel very difficult to heal.  At this point continue local wound care seems to be the Santyl dressing is helping. -Cam boot was dispensed to completely immobilize the heel.  He does have some difficulty walking due to immobility because of his Parkinson's.  However patient states he does ambulate.  I have asked the patient ambulate only with the boot on.  Patient states understanding -I will continue local wound care for now until his vascular flow has been optimized.  Procedure: Excisional Debridement of Wound~improving slowly Rationale:  Removal of non-viable soft tissue from the wound to promote healing.  Anesthesia: none Pre-Debridement Wound Measurements: 0.6 cm x 0.6 cm x 0.3 cm post-Debridement Wound Measurements: 0.7 cm x 0.7 cm x 0.3 cm Type of Debridement: Sharp Excisional Tissue Removed: Non-viable soft tissue Depth of Debridement: subcutaneous tissue. Technique: Sharp excisional debridement to bleeding, viable wound base.  Dressing: Dry, sterile, compression dressing. Disposition: Patient tolerated procedure well. Patient to return in 1 week for follow-up.  No follow-ups on file.     No follow-ups on file.

## 2020-07-05 ENCOUNTER — Ambulatory Visit: Payer: Medicare Other | Admitting: Podiatry

## 2020-07-05 ENCOUNTER — Other Ambulatory Visit: Payer: Self-pay

## 2020-07-05 DIAGNOSIS — E08621 Diabetes mellitus due to underlying condition with foot ulcer: Secondary | ICD-10-CM | POA: Diagnosis not present

## 2020-07-05 DIAGNOSIS — L97422 Non-pressure chronic ulcer of left heel and midfoot with fat layer exposed: Secondary | ICD-10-CM | POA: Diagnosis not present

## 2020-07-09 ENCOUNTER — Encounter: Payer: Self-pay | Admitting: Podiatry

## 2020-07-09 NOTE — Progress Notes (Signed)
Subjective:  Patient ID: [name], male    DOB: Nov 09, 1961,  MRN: [id number]  Chief Complaint  Patient presents with  . Foot Pain    1 month F/U LT FT pain-    59 y.o. male presents for wound care.  Patient presents with a complaint of left heel ulceration.    No clinical signs of infection.  It appears to be doing really well.  Patient is ambulating in cam boot.  He has been in center wet-to-dry dressing changes.  The wound appears to be about the same.  Patient underwent left lower extremity angiogram for which the results were \\No  significant stenosis of the femoral-popliteal segment.  3 tibial arteries are patent proximally.  The AT and the PT are normal caliber to the ankle.  The peroneal artery is small caliber throughout.  There is no target for intervention.    Review of Systems: Negative except as noted in the HPI. Denies N/V/F/Ch.  Past Medical History:  Diagnosis Date  . Anxiety   . Bipolar 1 disorder ([place])   . Depression   . High blood pressure   . High cholesterol   . Parkinson's disease ([place])   . Peripheral neuropathy   . Thyroid disease   . Tremor     Current Outpatient Medications:  .  ALPRAZolam (XANAX) 1 MG tablet, TAKE ONE TABLET BY MOUTH THREE TIMES A DAY AS NEEDED FOR ANXIETY (Patient taking differently: Take 1 mg by mouth 2 (two) times daily. ), Disp: 90 tablet, Rfl: 3 .  amLODipine (NORVASC) 5 MG tablet, Take 5 mg by mouth daily., Disp: , Rfl:  .  aspirin (ASPIR-LOW) 81 MG EC tablet, Take 81 mg by mouth daily. , Disp: , Rfl:  .  atorvastatin (LIPITOR) 20 MG tablet, Take 20 mg by mouth at bedtime., Disp: , Rfl:  .  carbidopa-levodopa (SINEMET IR) 25-100 MG tablet, Take 1.5 tablets by mouth 3 (three) times daily., Disp: 400 tablet, Rfl: 3 .  Carbidopa-Levodopa ER (SINEMET CR) 25-100 MG tablet controlled release, Take 1 tablet by mouth at bedtime., Disp: 30 tablet, Rfl: 11 .  Cholecalciferol (VITAMIN D3) 1000 units CAPS, Take 1,000 Units by mouth  daily., Disp: , Rfl:  .  collagenase (SANTYL) ointment, Apply 1 application topically daily., Disp: 30 g, Rfl: 0 .  collagenase (SANTYL) ointment, Apply 1 application topically daily., Disp: 15 g, Rfl: 0 .  doxycycline (VIBRA-TABS) 100 MG tablet, Take 1 tablet (100 mg total) by mouth 2 (two) times daily., Disp: 30 tablet, Rfl: 1 .  gabapentin (NEURONTIN) 100 MG capsule, Take 100 mg by mouth at bedtime. , Disp: , Rfl:  .  levothyroxine (SYNTHROID) 75 MCG tablet, TAKE ONE TABLET BY MOUTH EVERY MORNING (Patient taking differently: Take 75 mcg by mouth daily. ), Disp: 90 tablet, Rfl: 0 .  lithium carbonate 300 MG capsule, TAKE TWO CAPSULES BY MOUTH AT BEDTIME (Patient taking differently: Take 600 mg by mouth at bedtime. ), Disp: 60 capsule, Rfl: 4 .  Polyethyl Glycol-Propyl Glycol (SYSTANE OP), Place 1 drop into both eyes 4 (four) times daily as needed (dry eyes)., Disp: , Rfl:  .  polyethylene glycol (MIRALAX / GLYCOLAX) 17 g packet, Take 17 g by mouth daily., Disp: , Rfl:  .  risperidone (RISPERDAL) 4 MG tablet, TAKE ONE TABLET BY MOUTH EVERY NIGHT AT BEDTIME (Patient taking differently: Take 4 mg by mouth at bedtime. ), Disp: 60 tablet, Rfl: 3 .  traZODone (DESYREL) 50 MG tablet, TAKE 1-2 TABLETS BY  MOUTH AT BEDTIME AS NEEDED (Patient taking differently: Take 50 mg by mouth at bedtime. ), Disp: 180 tablet, Rfl: 0  Social History   Tobacco Use  Smoking Status Former Smoker  . Packs/day: 0.10  . Types: Cigarettes  . Quit date: 05/29/2019  . Years since quitting: 1.1  Smokeless Tobacco Never Used  Tobacco Comment   only occasionally smokes    Allergies  Allergen Reactions  . Codeine Other (See Comments)    Agitation/mean   . 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 There is no height or weight on file to calculate BMI. Constitutional Well developed. Well nourished.  Vascular Dorsalis pedis pulses diminished  bilaterally. Posterior tibial pulses diminished bilaterally. Capillary refill normal to all digits.  No cyanosis or clubbing noted. Pedal hair growth normal.  Neurologic Normal speech. Oriented to person, place, and time. Protective sensation absent  Dermatologic Wound Location: Left heel pressure ulcer fat layer exposed Wound Base: Necrotic eschar Peri-wound: Normal Exudate: None: wound tissue dry   Orthopedic: No pain to palpation either foot.   Radiographs: None Assessment:   1. 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  Left heel pressure ulcer with fat layer exposed~stagnant -Debridement as below. -Dressed santyl wet-to-dry dressing, DSD. -Continue off-loading with pillows to the back of the leg as well as offloading boot.  If unable to resolve patient will benefit from cam boot immobilization -At this time patient will benefit from aggressive Betadine wet-to-dry dressing changes at least 3 times a week with monitoring closely.   -Patient has small vessel disease past the ankle level which is making the heel very difficult to heal.  At this point continue local wound care seems to be the Santyl dressing is helping. -Cam boot was dispensed to completely immobilize the heel.  He does have some difficulty walking due to immobility because of his Parkinson's.  However patient states he does ambulate.  I have asked the patient ambulate only with the boot on.  Patient states understanding -I will continue local wound care for now until his vascular flow has been optimized. -I will have the patient start doing collagen dressing to the wound starting next time when I see him.  Procedure: Excisional Debridement of Wound~stagnant Rationale: Removal of non-viable soft tissue from the wound to promote healing.  Anesthesia:  none Pre-Debridement Wound Measurements: 0.6 cm x 0.6 cm x 0.3 cm post-Debridement Wound Measurements: 0.7 cm x 0.7 cm x 0.3 cm Type of Debridement: Sharp Excisional Tissue Removed: Non-viable soft tissue Depth of Debridement: subcutaneous tissue. Technique: Sharp excisional debridement to bleeding, viable wound base.  Dressing: Dry, sterile, compression dressing. Disposition: Patient tolerated procedure well. Patient to return in 1 week for follow-up.  No follow-ups on file.     No follow-ups on file.

## 2020-07-11 ENCOUNTER — Encounter: Payer: Self-pay | Admitting: Physician Assistant

## 2020-07-11 ENCOUNTER — Other Ambulatory Visit: Payer: Self-pay

## 2020-07-11 ENCOUNTER — Ambulatory Visit (INDEPENDENT_AMBULATORY_CARE_PROVIDER_SITE_OTHER): Payer: Medicare Other | Admitting: Physician Assistant

## 2020-07-11 DIAGNOSIS — F319 Bipolar disorder, unspecified: Secondary | ICD-10-CM

## 2020-07-11 DIAGNOSIS — G4700 Insomnia, unspecified: Secondary | ICD-10-CM | POA: Diagnosis not present

## 2020-07-11 DIAGNOSIS — I739 Peripheral vascular disease, unspecified: Secondary | ICD-10-CM

## 2020-07-11 DIAGNOSIS — F411 Generalized anxiety disorder: Secondary | ICD-10-CM | POA: Diagnosis not present

## 2020-07-11 DIAGNOSIS — G2119 Other drug induced secondary parkinsonism: Secondary | ICD-10-CM

## 2020-07-11 DIAGNOSIS — E039 Hypothyroidism, unspecified: Secondary | ICD-10-CM

## 2020-07-11 NOTE — Progress Notes (Signed)
[facility] Med Check  Patient ID: [name],  MRN: [id number]  PCP: Velna Hatchet, MD  Date of Evaluation: 07/11/2020 Time spent:40 minutes  Chief Complaint:  Chief Complaint    Anxiety; Depression; Follow-up      HISTORY/CURRENT STATUS: HPI For routine med check. Wife [name] is with him.  Vernice has had more medical problems since the last visit. Has a pressure ulcer on left foot and has to wear a walking boot. It is healing very slowly. He had a left lower extremity angiogram (per note of  Dr. Posey Pronto, Podiatry) that showed no significant stenosis until the peroneal artery which was small caliber. [name] says they couldn't do anything surgically to help b/c the artery is too small.   Also has colon polyps. Has seen a surgeon, who wanted to wait until after the vascular results before pursuing anything.   The Parkinson's is about the same, although the neurologist did add another Sinemet in the evenings. [name] says he is sleeping a lot more after that addition. He is drooling a lot and will have Botox injections beginning next week for that problem.  When specifically asked how he's doing he said, "I feel like crap," and then chuckled. "I guess the medicines are working." Due to the parkinson's, he isn't able to do much although he is still ambulatory.  Their son and daughter in law came and got them a few weeks ago, and took them to visit their new home in [place].Anxiety is controlled. Xanax is still effective.  They both really enjoyed it. He denies SI/HI.  Report no manic symptoms.  He sleeps a lot and doesn't have much energy. No paranoia or hallucinations.   No changes in neuro or MS ROS.  Individual Medical History/ Review of Systems: Changes? :Yes  Has pressure sore, left foot, had left femoral artery. Also has colon polyps  Past medications for mental health diagnoses include: Lithium, Risperdal, Xanax, Synthroid, Latuda, trazodone, Depakote, Lamictal  Allergies:  Codeine and Lamisil [terbinafine hcl]  Current Medications:  Current Outpatient Medications:  .  ALPRAZolam (XANAX) 1 MG tablet, TAKE ONE TABLET BY MOUTH THREE TIMES A DAY AS NEEDED FOR ANXIETY (Patient taking differently: Take 1 mg by mouth 2 (two) times daily. ), Disp: 90 tablet, Rfl: 3 .  amLODipine (NORVASC) 5 MG tablet, Take 5 mg by mouth daily., Disp: , Rfl:  .  aspirin (ASPIR-LOW) 81 MG EC tablet, Take 81 mg by mouth daily. , Disp: , Rfl:  .  atorvastatin (LIPITOR) 20 MG tablet, Take 20 mg by mouth at bedtime., Disp: , Rfl:  .  carbidopa-levodopa (SINEMET IR) 25-100 MG tablet, Take 1.5 tablets by mouth 3 (three) times daily., Disp: 400 tablet, Rfl: 3 .  Carbidopa-Levodopa ER (SINEMET CR) 25-100 MG tablet controlled release, Take 1 tablet by mouth at bedtime., Disp: 30 tablet, Rfl: 11 .  Cholecalciferol (VITAMIN D3) 1000 units CAPS, Take 1,000 Units by mouth daily., Disp: , Rfl:  .  collagenase (SANTYL) ointment, Apply 1 application topically daily., Disp: 30 g, Rfl: 0 .  collagenase (SANTYL) ointment, Apply 1 application topically daily., Disp: 15 g, Rfl: 0 .  gabapentin (NEURONTIN) 100 MG capsule, Take 100 mg by mouth at bedtime. , Disp: , Rfl:  .  levothyroxine (SYNTHROID) 75 MCG tablet, TAKE ONE TABLET BY MOUTH EVERY MORNING (Patient taking differently: Take 75 mcg by mouth daily. ), Disp: 90 tablet, Rfl: 0 .  lithium carbonate 300 MG capsule, TAKE TWO CAPSULES BY MOUTH AT BEDTIME (Patient  taking differently: Take 600 mg by mouth at bedtime. ), Disp: 60 capsule, Rfl: 4 .  Polyethyl Glycol-Propyl Glycol (SYSTANE OP), Place 1 drop into both eyes 4 (four) times daily as needed (dry eyes)., Disp: , Rfl:  .  polyethylene glycol (MIRALAX / GLYCOLAX) 17 g packet, Take 17 g by mouth daily., Disp: , Rfl:  .  risperidone (RISPERDAL) 4 MG tablet, TAKE ONE TABLET BY MOUTH EVERY NIGHT AT BEDTIME (Patient taking differently: Take 4 mg by mouth at bedtime. ), Disp: 60 tablet, Rfl: 3 .  traZODone  (DESYREL) 50 MG tablet, TAKE 1-2 TABLETS BY MOUTH AT BEDTIME AS NEEDED (Patient taking differently: Take 50 mg by mouth at bedtime. ), Disp: 180 tablet, Rfl: 0 .  doxycycline (VIBRA-TABS) 100 MG tablet, Take 1 tablet (100 mg total) by mouth 2 (two) times daily. (Patient not taking: Reported on 07/11/2020), Disp: 30 tablet, Rfl: 1 Medication Side Effects: none  Family Medical/ Social History: Changes? No  MENTAL HEALTH EXAM:  There were no vitals taken for this visit.There is no height or weight on file to calculate BMI.  General Appearance: Casual, Neat and Well Groomed  Eye Contact:  Good  Speech:  Clear and Coherent, Slow and Slurred due to PD  Volume:  Normal  Mood:  Euthymic  Affect:  Flat but he does laugh some  Thought Process:  Goal Directed and Descriptions of Associations: Intact  Orientation:  Full (Time, Place, and Person)  Thought Content: Logical   Suicidal Thoughts:  No  Homicidal Thoughts:  No  Memory:  WNL  Judgement:  Good  Insight:  Good  Psychomotor Activity:  Decreased, Shuffling Gait and has walking boot on left foot  Concentration:  Concentration: Good  Recall:  Good  Fund of Knowledge: Good  Language: Good  Assets:  Desire for Improvement  ADL's:  Impaired  Cognition: WNL  Prognosis:  Good    DIAGNOSES:    ICD-10-CM   1. Bipolar I disorder ([place])  F31.9   2. Generalized anxiety disorder  F41.1   3. Insomnia, unspecified type  G47.00   4. 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 Peripheral vascular disease ([facility])  I73.9   6. Hypothyroidism, unspecified type  E03.9     Receiving Psychotherapy: No    RECOMMENDATIONS: PDMP was reviewed. I provided 40 minutes face to face time during this encounter, including reviewing notes from recent providers. Continue Xanax 1 mg, 1/2-1 3 times daily as needed. Continue gabapentin 100 mg 1 nightly. Continue lithium 300 mg, 2 p.o. nightly. Continue Synthroid 75 mcg q am. Continue Risperdal 4  mg nightly. Continue trazodone 50 mg, 1-2 nightly as needed.   He has labs at neuro and/or PCP and is due in the next few months. [name] will see that I get a copy of them. Return in 6 months.  Donnal Moat, PA-C

## 2020-07-15 ENCOUNTER — Encounter: Payer: Self-pay | Admitting: Neurology

## 2020-07-15 ENCOUNTER — Other Ambulatory Visit: Payer: Self-pay

## 2020-07-15 ENCOUNTER — Ambulatory Visit: Payer: Medicare Other | Admitting: Neurology

## 2020-07-15 VITALS — BP 121/76 | HR 91 | Ht 72.0 in | Wt 150.5 lb

## 2020-07-15 DIAGNOSIS — K117 Disturbances of salivary secretion: Secondary | ICD-10-CM

## 2020-07-15 DIAGNOSIS — R269 Unspecified abnormalities of gait and mobility: Secondary | ICD-10-CM

## 2020-07-15 DIAGNOSIS — R413 Other amnesia: Secondary | ICD-10-CM | POA: Diagnosis not present

## 2020-07-15 DIAGNOSIS — G2119 Other drug induced secondary parkinsonism: Secondary | ICD-10-CM

## 2020-07-15 MED ORDER — ONABOTULINUMTOXINA 100 UNITS IJ SOLR
100.0000 [IU] | Freq: Once | INTRAMUSCULAR | Status: AC
  Administered 2020-07-15: 100 [IU] via INTRAMUSCULAR

## 2020-07-15 MED ORDER — BOTOX 100 UNITS IJ SOLR: 100.0000 [IU] | INTRAMUSCULAR | 3 refills | Status: DC

## 2020-07-15 NOTE — Progress Notes (Signed)
PATIENT: [name] DOB: 04/09/1961  REASON FOR VISIT: follow up HISTORY FROM: patient  HISTORY OF PRESENT ILLNESS: Today 07/15/20  HISTORY  HISTORY OF PRESENT ILLNESS: He has PMHx of HTN, hypothyroidism, on supplement, bipolar disorder, taking risperidone 4 mg daily for more than ten years, lithium 318m tid, report level was within normal limit, he is also taking alprazolam 143mtid. His psychiatrist is Dr. CuCandis SchatzHe had a lot of stress recently, his son died after struck by a truck in August 2013, his wife suffered stroke in August 2011.  He complains of bilateral hand tremor over the past few years, getting worse since 2013, fairly symmtric, if he holds anything more than 5 Lbs, for more than 10 minutes, he has right arm and hand shaking, he spills water when holding a glass of water. He denies significant gait difficulty, denies loss sense of smell. He denies family history of tremor.  Lab in July 25th 2014, showed normal CBC.   MRI of the brain done 07/19/2013 without acute findings. His tremor gets worse if he is anxious.  UPDATE Sep 9th 2015:He has gradually declining functional status, he is no longer working, sit at home most of the time, is no longer driving, he was recently evaluated by his psychiatrist Dr. CuCandis Schatzn June 2015, Lithium was decreased from 9009maily to 600 every day, he is also taking Risperdal 4 mg every day He has worsening memory trouble, constipation, restless he also reported worsening depression, to the point of suicidal thought sometimes   UPDATE Oct 26 2014: Sinemet 25/100 3 times a day since Sep 2015, has been very helpful, his tremor has much improved, he continue has mild unsteady gait, recently complains of bilateral heel pain, thick skin at left medial heel.He is still taking Risperdal 4 mg every night, Xanax 1 mg half to one tablet as needed, lithium 300 mg 2 tablets every night, Synthroid 75 g once every day  UPDATE Oct 15 2015: His tremor is better with sinemet, He complains of right foot pain, planning on to have surgery soon. Today his wife is also concerned about his slow worsening gait difficulty, unsteady gait, difficulty to clear his feet from the floor, worsening bilateral upper extremity weakness, bilateral deltoid muscle wasting   UPDATE January 28 2016: EMG nerve conduction study in October 30 2015 was normal, there was no evidence of large fiber peripheral neuropathy, right lumbar or right cervical radiculopathy. He is planning on to have right foot surgery for benign mass in January 30 2016  He is still on Sinemet 25/100 mg 3 times a day, which does help his tremor  UPDATE Jul 30 2016: Patient reported increased tremor, bradykinesia, memory loss since his lithium was increased from 600 mg to 900 mg every night per family this is based on laboratory evaluation, there is no significant change on patient mood,  In addition he is also taking Risperdal 4 mg once every night, trazodone 50 mg every night, thyroid supplement. He is very frustrated about his slow movement, memory loss, bilateral hands tremor, cannot even clip his fingernails  He is under [facility] of [facility] PA TerDonnal Moatone ([phone] April 29 2017: He is with his wife, he has worsening memory loss, "just sit there", Watch TV, he needs help dressing, bathing, wear depends. He drinks 3 boost a day, lack of appetite, he sleeps well.  He is now back to lithium 600m3mily, He is so flat.  His wife is going to retire April 30 2017 to take care of him.  This is a sudden change compared to 02-09-12, he was a Librarian, academic of a warehouse, this a sudden change since his son died of accident in 02-09-2012. He had diagnosis of bipolar disorder since 02/09/2008,   We have reviewed repeat CT chest without contrast on June 12th 2018: Stable right lung base subpleural mass,  I was able to talk with his psychiatrist PA Donnal Moat,, updated on the information, and also concerned about medication side effect  UPDATE Sept 11 2018:  He is accompanied by his wife at today's clinical visit, wife is apparently very frustrated about his flat affect, lack of motivations,he needs more help inhis daily activity, personal care  He is on lower dose of risperidone 53m qhs, trazodone 544mqhs, Lithium 60045mhs, he continue to take Sinemet 25/100 mg 3 times a day, which has helped his hand tremor.  I personally reviewed MRI of the brain on May 09 2017, there is generalized atrophy mild supratentorial small vessel disease Extensive laboratory evaluation showed normal ESR, CPK, CBC showed mild elevated WBC 11 point 9, vitamin D was decreased 18 point 7, CMP showed mild elevated glucose 107  Update May 23, 2020: Patient was diagnosed with secondary Parkinson's disease, due to his long-term neurotrophic medication treatment, he was started on Sinemet 25/100 mg 3 times daily since 201Mar 26, 2018hich did help his symptoms,  Personally reviewed MRI of the brain most recent was in June 2018, generalized atrophy, supratentorium small vessel disease,  His Sinemet was increased from 1-1 and half 3 times a day due to his reported increased gait abnormality, drooling,   Higher dose of Sinemet, did help his movement, but he is dealing with nonhealing left foot ulcer, has peripheral vascular disease, has pending procedure  Wife also reported that he has difficulty turning his body on bed, has to wake her up to help him using bathroom, he also had excessive drooling for more than 1 year, drooling into his plate, on his shirt, on the floor  UPDATE July 15 2020: This is his first Botox injection for hypersialorrhea, potential mechanical symptoms side effect explained, he tolerated the procedure well, used Botox a 100 units  Following last visit in July 2021, because of his complaints of difficulty turning at bedtime, restarted Sinemet CR  25/100 mg at bedtime, wife noticed that he continue have trouble sleeping, turning, also had excessive daytime sleepiness, there was no significant improvement adding on Sinemet CR, will stop it today, continue immediate release Sinemet 25/100 mg 1 and half tablets 3 times a day at 9 a.m., 1 PM, 5 PM  REVIEW OF SYSTEMS: Out of a complete 14 system review of symptoms, the patient complains only of the following symptoms, and all other reviewed systems are negative.  Gait instability, falls  ALLERGIES: Allergies  Allergen Reactions  . Codeine Other (See Comments)    Agitation/mean   . Lamisil [Terbinafine Hcl]     Lost taste buds for 2 years    HOME MEDICATIONS: Outpatient Medications Prior to Visit  Medication Sig Dispense Refill  . ALPRAZolam (XANAX) 1 MG tablet TAKE ONE TABLET BY MOUTH THREE TIMES A DAY AS NEEDED FOR ANXIETY (Patient taking differently: Take 1 mg by mouth 2 (two) times daily. ) 90 tablet 3  . amLODipine (NORVASC) 5 MG tablet Take 5 mg by mouth daily.    . aMarland Kitchenpirin (ASPIR-LOW) 81 MG EC tablet Take 81 mg by  mouth daily.     Marland Kitchen atorvastatin (LIPITOR) 20 MG tablet Take 20 mg by mouth at bedtime.    . botulinum toxin Type A (BOTOX) 100 units SOLR injection Inject 100 Units into the muscle every 3 (three) months. 1 each 3  . carbidopa-levodopa (SINEMET IR) 25-100 MG tablet Take 1.5 tablets by mouth 3 (three) times daily. 400 tablet 3  . Carbidopa-Levodopa ER (SINEMET CR) 25-100 MG tablet controlled release Take 1 tablet by mouth at bedtime. 30 tablet 11  . Cholecalciferol (VITAMIN D3) 1000 units CAPS Take 1,000 Units by mouth daily.    . collagenase (SANTYL) ointment Apply 1 application topically daily. 30 g 0  . collagenase (SANTYL) ointment Apply 1 application topically daily. 15 g 0  . doxycycline (VIBRA-TABS) 100 MG tablet Take 1 tablet (100 mg total) by mouth 2 (two) times daily. 30 tablet 1  . gabapentin (NEURONTIN) 100 MG capsule Take 100 mg by mouth at bedtime.     Marland Kitchen  levothyroxine (SYNTHROID) 75 MCG tablet TAKE ONE TABLET BY MOUTH EVERY MORNING (Patient taking differently: Take 75 mcg by mouth daily. ) 90 tablet 0  . lithium carbonate 300 MG capsule TAKE TWO CAPSULES BY MOUTH AT BEDTIME (Patient taking differently: Take 600 mg by mouth at bedtime. ) 60 capsule 4  . Polyethyl Glycol-Propyl Glycol (SYSTANE OP) Place 1 drop into both eyes 4 (four) times daily as needed (dry eyes).    . polyethylene glycol (MIRALAX / GLYCOLAX) 17 g packet Take 17 g by mouth daily.    . risperidone (RISPERDAL) 4 MG tablet TAKE ONE TABLET BY MOUTH EVERY NIGHT AT BEDTIME (Patient taking differently: Take 4 mg by mouth at bedtime. ) 60 tablet 3  . traZODone (DESYREL) 50 MG tablet TAKE 1-2 TABLETS BY MOUTH AT BEDTIME AS NEEDED (Patient taking differently: Take 50 mg by mouth at bedtime. ) 180 tablet 0   No facility-administered medications prior to visit.    PAST MEDICAL HISTORY: Past Medical History:  Diagnosis Date  . Anxiety   . Bipolar 1 disorder ([place])   . Depression   . High blood pressure   . High cholesterol   . Parkinson's disease ([place])   . Peripheral neuropathy   . Thyroid disease   . Tremor     PAST SURGICAL HISTORY: Past Surgical History:  Procedure Laterality Date  . HEMORRHOID SURGERY     1980's  . IR ANGIOGRAM EXTREMITY LEFT  05/28/2020  . IR RADIOLOGIST EVAL & MGMT  03/14/2020  . IR US GUIDE VASC ACCESS LEFT  05/28/2020  . MULTIPLE TOOTH EXTRACTIONS     2016 Has top plate    FAMILY HISTORY: Family History  Problem Relation Age of Onset  . Heart attack Mother   . Liver disease Father     SOCIAL HISTORY: Social History   Socioeconomic History  . Marital status: Married    Spouse name: [name]  . Number of children: 1  . Years of education: 84  . Highest education level: Not on file  Occupational History    Comment: Not working  Tobacco Use  . Smoking status: Former Smoker    Packs/day: 0.10    Types: Cigarettes    Quit date: 05/29/2019     Years since quitting: 1.1  . Smokeless tobacco: Never Used  . Tobacco comment: only occasionally smokes  Vaping Use  . Vaping Use: Never used  Substance and Sexual Activity  . Alcohol use: No  . Drug use: No  Comment: Quit 2009  . Sexual activity: Not on file  Other Topics Concern  . Not on file  Social History Narrative   Patient lives at home with with his wife [name]). Patient is not working at this time. Patient has a 12th grade education.    Caffeine - four mountain dews daily.   Right handed.   Patient has one child.   Social Determinants of Health   Financial Resource Strain:   . Difficulty of Paying Living Expenses: Not on file  Food Insecurity:   . Worried About Charity fundraiser in the Last Year: Not on file  . Ran Out of Food in the Last Year: Not on file  Transportation Needs:   . Lack of Transportation (Medical): Not on file  . Lack of Transportation (Non-Medical): Not on file  Physical Activity:   . Days of Exercise per Week: Not on file  . Minutes of Exercise per Session: Not on file  Stress:   . Feeling of Stress : Not on file  Social Connections:   . Frequency of Communication with Friends and Family: Not on file  . Frequency of Social Gatherings with Friends and Family: Not on file  . Attends Religious Services: Not on file  . Active Member of Clubs or Organizations: Not on file  . Attends Archivist Meetings: Not on file  . Marital Status: Not on file  Intimate Partner Violence:   . Fear of Current or Ex-Partner: Not on file  . Emotionally Abused: Not on file  . Physically Abused: Not on file  . Sexually Abused: Not on file    PHYSICAL EXAM  Vitals:   07/15/20 1327  BP: 121/76  Pulse: 91  Weight: 150 lb 8 oz (68.3 kg)  Height: 6' (1.829 m)   Body mass index is 20.41 kg/m.  Generalized: Well developed, in no acute distress  MMSE - Mini Mental State Exam 11/22/2019 11/02/2018 07/27/2017  Not completed: - (No Data) -    Orientation to time '5 3 5  ' Orientation to Place '4 5 5  ' Registration '3 3 3  ' Attention/ Calculation '1 5 5  ' Recall '2 1 3  ' Language- name 2 objects '2 2 2  ' Language- repeat '1 1 1  ' Language- follow 3 step command '3 2 3  ' Language- follow 3 step command-comments - took the paper in his left hand -  Language- read & follow direction '1 1 1  ' Write a sentence '1 1 1  ' Copy design '1 1 1  ' Total score '24 25 30     ' PHYSICAL EXAMNIATION:  Gen: NAD, conversant, well nourised, well groomed                     Cardiovascular: Regular rate rhythm, no peripheral edema, warm, nontender. Eyes: Conjunctivae clear without exudates or hemorrhage Neck: Supple, no carotid bruits. Pulmonary: Clear to auscultation bilaterally   NEUROLOGICAL EXAM:  MENTAL STATUS: Speech/Cognition: Awake, alert, normal speech, oriented to history taking and casual conversation.  CRANIAL NERVES: CN II: Visual fields are full to confrontation.  Pupils are round equal and briskly reactive to light. CN III, IV, VI: extraocular movement are normal. No ptosis. CN V: Facial sensation is intact to light touch. CN VII: Face is symmetric with normal eye closure and smile. CN VIII: Hearing is normal to casual conversation CN IX, X: Palate elevates symmetrically. Phonation is normal. CN XI: Head turning and shoulder shrug are intact CN XII: Tongue is  midline with normal movements and no atrophy.  MOTOR: Normal muscle strengths, moderate rigidity bilaterally, bradykinesia  REFLEXES: Reflexes are 2  and symmetric at the biceps, triceps, knees and ankles. Plantar responses are flexor.  SENSORY: Intact to light touch, pinprick, positional and vibratory sensation at fingers and toes.  COORDINATION: There is no trunk or limb ataxia.    GAIT/STANCE: Need to push up to get up from seated position, wearing left boot, unsteady, decreased bilateral arm swing  DIAGNOSTIC DATA (LABS, IMAGING, TESTING) - I reviewed patient records,  labs, notes, testing and imaging myself where available.  Lab Results  Component Value Date   WBC 11.0 (H) 05/28/2020   HGB 14.1 05/28/2020   HCT 43.9 05/28/2020   MCV 91.6 05/28/2020   PLT 229 05/28/2020      Component Value Date/Time   NA 139 05/28/2020 0735   NA 143 04/29/2017 0827   K 3.9 05/28/2020 0735   CL 105 05/28/2020 0735   CO2 23 05/28/2020 0735   GLUCOSE 104 (H) 05/28/2020 0735   BUN 12 05/28/2020 0735   BUN 10 04/29/2017 0827   CREATININE 0.99 05/28/2020 0735   CALCIUM 9.5 05/28/2020 0735   PROT 7.3 04/29/2017 0827   ALBUMIN 4.7 04/29/2017 0827   AST 12 04/29/2017 0827   ALT 14 04/29/2017 0827   ALKPHOS 116 04/29/2017 0827   BILITOT 0.5 04/29/2017 0827   GFRNONAA >60 05/28/2020 0735   GFRAA >60 05/28/2020 0735   No results found for: CHOL, HDL, LDLCALC, LDLDIRECT, TRIG, CHOLHDL No results found for: HGBA1C Lab Results  Component Value Date   VITAMINB12 782 04/29/2017   Lab Results  Component Value Date   TSH 4.410 04/29/2017      ASSESSMENT AND PLAN 59 y.o. year old male   Secondary parkinsonism  Fairly symmetric, history of long-term psychiatric medication treatment, including Risperdal, lithium,  Was treated with Sinemet 25/100 mg since 2018, did report improvement, more gait improvement with higher dose of 1.5 mg 3 times a day, taking it at 9 AM, 1 PM, 5 PM,  There was no significant change after add on Sinemet CR 25/100 mg every night, wife noticed excessive sleepiness during the daytime, will stop Sinemet CR today  Gait abnormality  Multiple factorial, parkinsonian features, left foot pain, wearing left boot  Peripheral vascular disease  He has vascular risk factor of sedentary lifestyle, hyperlipidemia, encourage increased water intake, aspirin 81 mg daily,  May consider refer him to physical therapy after his foot ulcer healed  Memory loss  Stable  Excessive drooling  Today he received Botox a 100 mg for hypersialorrhea, 50 units to  each parotid gland, divided into 3 injection sites   He will return to clinic in 3 months for repeat injection     Marcial Pacas, M.D. Ph.D.  [facility] [place], [place] [address] Phone: [phone] Fax:      [phone]

## 2020-07-15 NOTE — Telephone Encounter (Signed)
Rx sent to requested pharmacy

## 2020-07-15 NOTE — Telephone Encounter (Signed)
Could you please send patient's Botox prescription to [facility]?

## 2020-07-15 NOTE — Progress Notes (Signed)
**  Botox 100 units x 1 vial, NDC 3968-8648-47, Lot [id number], Exp 09/2022, office supply.//mck,rn**

## 2020-07-15 NOTE — Addendum Note (Signed)
Addended by: Noberto Retort C on: 07/15/2020 08:10 AM   Modules accepted: Orders

## 2020-07-16 NOTE — Telephone Encounter (Signed)
Received fax from [facility] regarding sending PA to [facility] for Botox. Completed EPA form in [employer] and submitted. Waiting for response.

## 2020-07-31 ENCOUNTER — Other Ambulatory Visit: Payer: Self-pay

## 2020-07-31 ENCOUNTER — Ambulatory Visit: Payer: Medicare Other | Admitting: Podiatry

## 2020-07-31 DIAGNOSIS — E08621 Diabetes mellitus due to underlying condition with foot ulcer: Secondary | ICD-10-CM | POA: Diagnosis not present

## 2020-07-31 DIAGNOSIS — L97422 Non-pressure chronic ulcer of left heel and midfoot with fat layer exposed: Secondary | ICD-10-CM

## 2020-07-31 MED ORDER — DOXYCYCLINE HYCLATE 100 MG PO TABS
100.0000 mg | ORAL_TABLET | Freq: Two times a day (BID) | ORAL | 0 refills | Status: DC
Start: 2020-07-31 — End: 2021-01-08

## 2020-08-01 ENCOUNTER — Other Ambulatory Visit: Payer: Self-pay | Admitting: Neurology

## 2020-08-01 ENCOUNTER — Other Ambulatory Visit: Payer: Self-pay | Admitting: Physician Assistant

## 2020-08-01 NOTE — Telephone Encounter (Signed)
review 

## 2020-08-02 ENCOUNTER — Encounter: Payer: Self-pay | Admitting: Podiatry

## 2020-08-02 NOTE — Progress Notes (Signed)
Subjective:  Patient ID: [name], male    DOB: May 07, 1961,  MRN: [id number]  Chief Complaint  Patient presents with  . Wound Check    PT stated he has no concerns or pain The wound has redness around it     59 y.o. male presents for wound care.  Patient presents with a complaint of left heel ulceration.    No clinical signs of infection.  Patient was doing well however there is some redness now and the wound has regressed a little bit in terms of maintaining dryness.  He denies any systemic signs of infection.  Patient underwent left lower extremity angiogram for which the results were \\No  significant stenosis of the femoral-popliteal segment.  3 tibial arteries are patent proximally.  The AT and the PT are normal caliber to the ankle.  The peroneal artery is small caliber throughout.  There is no target for intervention.    Review of Systems: Negative except as noted in the HPI. Denies N/V/F/Ch.  Past Medical History:  Diagnosis Date  . Anxiety   . Bipolar 1 disorder ([place])   . Depression   . High blood pressure   . High cholesterol   . Parkinson's disease ([place])   . Peripheral neuropathy   . Thyroid disease   . Tremor     Current Outpatient Medications:  .  ALPRAZolam (XANAX) 1 MG tablet, TAKE ONE TABLET BY MOUTH THREE TIMES A DAY AS NEEDED FOR ANXIETY (Patient taking differently: Take 1 mg by mouth 2 (two) times daily. ), Disp: 90 tablet, Rfl: 3 .  amLODipine (NORVASC) 5 MG tablet, Take 5 mg by mouth daily., Disp: , Rfl:  .  aspirin (ASPIR-LOW) 81 MG EC tablet, Take 81 mg by mouth daily. , Disp: , Rfl:  .  atorvastatin (LIPITOR) 20 MG tablet, Take 20 mg by mouth at bedtime., Disp: , Rfl:  .  botulinum toxin Type A (BOTOX) 100 units SOLR injection, Inject 100 Units into the muscle every 3 (three) months., Disp: 1 each, Rfl: 3 .  carbidopa-levodopa (SINEMET IR) 25-100 MG tablet, Take 1.5 tablets by mouth 3 (three) times daily., Disp: 400 tablet, Rfl: 3 .   Carbidopa-Levodopa ER (SINEMET CR) 25-100 MG tablet controlled release, Take 1 tablet by mouth at bedtime., Disp: 30 tablet, Rfl: 11 .  Cholecalciferol (VITAMIN D3) 1000 units CAPS, Take 1,000 Units by mouth daily., Disp: , Rfl:  .  collagenase (SANTYL) ointment, Apply 1 application topically daily., Disp: 30 g, Rfl: 0 .  collagenase (SANTYL) ointment, Apply 1 application topically daily., Disp: 15 g, Rfl: 0 .  doxycycline (VIBRA-TABS) 100 MG tablet, Take 1 tablet (100 mg total) by mouth 2 (two) times daily., Disp: 30 tablet, Rfl: 1 .  doxycycline (VIBRA-TABS) 100 MG tablet, Take 1 tablet (100 mg total) by mouth 2 (two) times daily., Disp: 20 tablet, Rfl: 0 .  gabapentin (NEURONTIN) 100 MG capsule, Take 100 mg by mouth at bedtime. , Disp: , Rfl:  .  levothyroxine (SYNTHROID) 75 MCG tablet, TAKE ONE TABLET BY MOUTH EVERY MORNING, Disp: 90 tablet, Rfl: 0 .  lithium carbonate 300 MG capsule, TAKE TWO CAPSULES BY MOUTH AT BEDTIME (Patient taking differently: Take 600 mg by mouth at bedtime. ), Disp: 60 capsule, Rfl: 4 .  Polyethyl Glycol-Propyl Glycol (SYSTANE OP), Place 1 drop into both eyes 4 (four) times daily as needed (dry eyes)., Disp: , Rfl:  .  polyethylene glycol (MIRALAX / GLYCOLAX) 17 g packet, Take 17 g by mouth  daily., Disp: , Rfl:  .  risperidone (RISPERDAL) 4 MG tablet, TAKE ONE TABLET BY MOUTH EVERY NIGHT AT BEDTIME (Patient taking differently: Take 4 mg by mouth at bedtime. ), Disp: 60 tablet, Rfl: 3 .  traZODone (DESYREL) 50 MG tablet, TAKE 1-2 TABLETS BY MOUTH AT BEDTIME AS NEEDED (Patient taking differently: Take 50 mg by mouth at bedtime. ), Disp: 180 tablet, Rfl: 0  Social History   Tobacco Use  Smoking Status Former Smoker  . Packs/day: 0.10  . Types: Cigarettes  . Quit date: 05/29/2019  . Years since quitting: 1.1  Smokeless Tobacco Never Used  Tobacco Comment   only occasionally smokes    Allergies  Allergen Reactions  . Codeine Other (See Comments)     Agitation/mean   . 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 There is no height or weight on file to calculate BMI. Constitutional Well developed. Well nourished.  Vascular Dorsalis pedis pulses diminished bilaterally. Posterior tibial pulses diminished bilaterally. Capillary refill normal to all digits.  No cyanosis or clubbing noted. Pedal hair growth normal.  Neurologic Normal speech. Oriented to person, place, and time. Protective sensation absent  Dermatologic Wound Location: Left heel ulcer with fat layer exposed Wound Base: Necrotic eschar Peri-wound: Normal Exudate: None: wound tissue dry   Orthopedic: No pain to palpation either foot.   Radiographs: None Assessment:   No diagnosis found. Plan:  Patient was evaluated and treated and all questions answered.  Left heel pressure ulcer with fat layer exposed~stagnant -Debridement as below. -Dressed Betadine wet-to-dry dressing -Continue off-loading with pillows to the back of the leg as well as offloading boot.  If unable to resolve patient will benefit from cam boot immobilization   -Patient has small vessel disease past the ankle level which is making the heel very difficult to heal.  At this point continue local wound care seems to be the Santyl dressing is helping. -Cam boot was dispensed to completely immobilize the heel.  He does have some difficulty walking due to immobility because of his Parkinson's.  However patient states he does ambulate.  I have asked the patient ambulate only with the boot on.  Patient states understanding -I will continue local wound care for now until his vascular flow has been optimized.   -Unfortunately patient wound has regressed a little bit.  We will return back to Betadine wet-to-dry dressing changes and then followed by application of Prisma if dried up adequately.  Procedure: Excisional Debridement of  Wound~stagnant Rationale: Removal of non-viable soft tissue from the wound to promote healing.  Anesthesia: none Pre-Debridement Wound Measurements: 0.6 cm x 0.6 cm x 0.3 cm post-Debridement Wound Measurements: 0.7 cm x 0.7 cm x 0.3 cm Type of Debridement: Sharp Excisional Tissue Removed: Non-viable soft tissue Depth of Debridement: subcutaneous tissue. Technique: Sharp excisional debridement to bleeding, viable wound base.  Dressing: Dry, sterile, compression dressing. Disposition: Patient tolerated procedure well. Patient to return in 1 week for follow-up.  No follow-ups on file.

## 2020-08-09 ENCOUNTER — Other Ambulatory Visit: Payer: Self-pay | Admitting: Physician Assistant

## 2020-08-16 ENCOUNTER — Other Ambulatory Visit: Payer: Self-pay

## 2020-08-16 ENCOUNTER — Encounter: Payer: Self-pay | Admitting: Podiatry

## 2020-08-16 ENCOUNTER — Ambulatory Visit (INDEPENDENT_AMBULATORY_CARE_PROVIDER_SITE_OTHER): Payer: Medicare Other | Admitting: Podiatry

## 2020-08-16 DIAGNOSIS — E08621 Diabetes mellitus due to underlying condition with foot ulcer: Secondary | ICD-10-CM

## 2020-08-16 DIAGNOSIS — L97422 Non-pressure chronic ulcer of left heel and midfoot with fat layer exposed: Secondary | ICD-10-CM

## 2020-08-16 NOTE — Progress Notes (Signed)
Subjective:  Patient ID: [name], male    DOB: 09-19-1961,  MRN: [id number]  Chief Complaint  Patient presents with  . Diabetic Ulcer    59 y.o. male presents for wound care.  Patient presents with a complaint of left heel ulceration.    No clinical signs of infection.  Patient was doing well however there is some redness now and the wound has regressed a little bit in terms of maintaining dryness.  He denies any systemic signs of infection.  Patient underwent left lower extremity angiogram for which the results were \\No  significant stenosis of the femoral-popliteal segment.  3 tibial arteries are patent proximally.  The AT and the PT are normal caliber to the ankle.  The peroneal artery is small caliber throughout.  There is no target for intervention.    Review of Systems: Negative except as noted in the HPI. Denies N/V/F/Ch.  Past Medical History:  Diagnosis Date  . Anxiety   . Bipolar 1 disorder ([place])   . Depression   . High blood pressure   . High cholesterol   . Parkinson's disease ([place])   . Peripheral neuropathy   . Thyroid disease   . Tremor     Current Outpatient Medications:  .  ALPRAZolam (XANAX) 1 MG tablet, TAKE ONE TABLET BY MOUTH THREE TIMES A DAY AS NEEDED FOR ANXIETY, Disp: 90 tablet, Rfl: 2 .  amLODipine (NORVASC) 5 MG tablet, Take 5 mg by mouth daily., Disp: , Rfl:  .  aspirin (ASPIR-LOW) 81 MG EC tablet, Take 81 mg by mouth daily. , Disp: , Rfl:  .  atorvastatin (LIPITOR) 20 MG tablet, Take 20 mg by mouth at bedtime., Disp: , Rfl:  .  botulinum toxin Type A (BOTOX) 100 units SOLR injection, Inject 100 Units into the muscle every 3 (three) months., Disp: 1 each, Rfl: 3 .  carbidopa-levodopa (SINEMET IR) 25-100 MG tablet, Take 1.5 tablets by mouth 3 (three) times daily., Disp: 400 tablet, Rfl: 3 .  Carbidopa-Levodopa ER (SINEMET CR) 25-100 MG tablet controlled release, Take 1 tablet by mouth at bedtime., Disp: 30 tablet, Rfl: 11 .  Cholecalciferol  (VITAMIN D3) 1000 units CAPS, Take 1,000 Units by mouth daily., Disp: , Rfl:  .  collagenase (SANTYL) ointment, Apply 1 application topically daily., Disp: 30 g, Rfl: 0 .  collagenase (SANTYL) ointment, Apply 1 application topically daily., Disp: 15 g, Rfl: 0 .  doxycycline (VIBRA-TABS) 100 MG tablet, Take 1 tablet (100 mg total) by mouth 2 (two) times daily., Disp: 30 tablet, Rfl: 1 .  doxycycline (VIBRA-TABS) 100 MG tablet, Take 1 tablet (100 mg total) by mouth 2 (two) times daily., Disp: 20 tablet, Rfl: 0 .  gabapentin (NEURONTIN) 100 MG capsule, Take 100 mg by mouth at bedtime. , Disp: , Rfl:  .  levothyroxine (SYNTHROID) 75 MCG tablet, TAKE ONE TABLET BY MOUTH EVERY MORNING, Disp: 90 tablet, Rfl: 0 .  lithium carbonate 300 MG capsule, TAKE TWO CAPSULES BY MOUTH AT BEDTIME (Patient taking differently: Take 600 mg by mouth at bedtime. ), Disp: 60 capsule, Rfl: 4 .  Polyethyl Glycol-Propyl Glycol (SYSTANE OP), Place 1 drop into both eyes 4 (four) times daily as needed (dry eyes)., Disp: , Rfl:  .  polyethylene glycol (MIRALAX / GLYCOLAX) 17 g packet, Take 17 g by mouth daily., Disp: , Rfl:  .  risperidone (RISPERDAL) 4 MG tablet, TAKE ONE TABLET BY MOUTH EVERY NIGHT AT BEDTIME (Patient taking differently: Take 4 mg by mouth at bedtime. ),  Disp: 60 tablet, Rfl: 3 .  traZODone (DESYREL) 50 MG tablet, TAKE 1-2 TABLETS BY MOUTH AT BEDTIME AS NEEDED (Patient taking differently: Take 50 mg by mouth at bedtime. ), Disp: 180 tablet, Rfl: 0  Social History   Tobacco Use  Smoking Status Former Smoker  . Packs/day: 0.10  . Types: Cigarettes  . Quit date: 05/29/2019  . Years since quitting: 1.2  Smokeless Tobacco Never Used  Tobacco Comment   only occasionally smokes    Allergies  Allergen Reactions  . Codeine Other (See Comments)    Agitation/mean   . 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 There is no height or  weight on file to calculate BMI. Constitutional Well developed. Well nourished.  Vascular Dorsalis pedis pulses diminished bilaterally. Posterior tibial pulses diminished bilaterally. Capillary refill normal to all digits.  No cyanosis or clubbing noted. Pedal hair growth normal.  Neurologic Normal speech. Oriented to person, place, and time. Protective sensation absent  Dermatologic Wound Location: Left heel ulcer with fat layer exposed Wound Base: Necrotic eschar Peri-wound: Normal Exudate: None: wound tissue dry   Orthopedic: No pain to palpation either foot.   Radiographs: None Assessment:   1. 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  Left heel pressure ulcer with fat layer exposed~decreasing -Debridement as below. -Dressed Prisma -Continue off-loading with pillows to the back of the leg as well as offloading boot.  If unable to resolve patient will benefit from cam boot immobilization   -Patient has small vessel disease past the ankle level which is making the heel very difficult to heal.  At this point continue local wound care seems to be the Santyl dressing is helping. -Cam boot was dispensed to completely immobilize the heel.  He does have some difficulty walking due to immobility because of his Parkinson's.  However patient states he does ambulate.  I have asked the patient ambulate only with the boot on.  Patient states understanding -Continue local wound care for now. -The wound was improving since last visit.  Patient can return to doing applying Prisma to the wound site.  Procedure: Excisional Debridement of Wound~decreasing Rationale: Removal of non-viable soft tissue from the wound to promote healing.  Anesthesia: none Pre-Debridement Wound Measurements: 0.5 cm x 0.4 cm x 0.3 cm post-Debridement Wound  Measurements: 0.6 cm x 0.5 cm x 0.3 cm Type of Debridement: Sharp Excisional Tissue Removed: Non-viable soft tissue Depth of Debridement: subcutaneous tissue. Technique: Sharp excisional debridement to bleeding, viable wound base.  Dressing: Dry, sterile, compression dressing. Disposition: Patient tolerated procedure well. Patient to return in 1 week for follow-up.  No follow-ups on file.

## 2020-08-31 ENCOUNTER — Other Ambulatory Visit: Payer: Self-pay | Admitting: Physician Assistant

## 2020-09-02 NOTE — Telephone Encounter (Signed)
review 

## 2020-09-04 ENCOUNTER — Ambulatory Visit (INDEPENDENT_AMBULATORY_CARE_PROVIDER_SITE_OTHER): Payer: Medicare Other | Admitting: Podiatry

## 2020-09-04 ENCOUNTER — Other Ambulatory Visit: Payer: Self-pay

## 2020-09-04 DIAGNOSIS — L97422 Non-pressure chronic ulcer of left heel and midfoot with fat layer exposed: Secondary | ICD-10-CM | POA: Diagnosis not present

## 2020-09-04 DIAGNOSIS — E08621 Diabetes mellitus due to underlying condition with foot ulcer: Secondary | ICD-10-CM

## 2020-09-05 ENCOUNTER — Encounter: Payer: Self-pay | Admitting: Podiatry

## 2020-09-05 NOTE — Progress Notes (Signed)
Subjective:  Patient ID: [name], male    DOB: 07/03/1961,  MRN: [id number]  Chief Complaint  Patient presents with  . Wound Check    PT stated he has no pain or concerns but does have some bleeding/drainage when the bandage came off     59 y.o. male presents for wound care.  Patient presents with a complaint of left heel ulceration.    It appears the patient is not offloading adequately when he is lying down.  I discussed with him the importance of offloading it seems like the wound is getting worse because of decreased offloading.  Patient states understanding and will offload more aggressively  Patient underwent left lower extremity angiogram for which the results were \\No  significant stenosis of the femoral-popliteal segment.  3 tibial arteries are patent proximally.  The AT and the PT are normal caliber to the ankle.  The peroneal artery is small caliber throughout.  There is no target for intervention.    Review of Systems: Negative except as noted in the HPI. Denies N/V/F/Ch.  Past Medical History:  Diagnosis Date  . Anxiety   . Bipolar 1 disorder ([place])   . Depression   . High blood pressure   . High cholesterol   . Parkinson's disease ([place])   . Peripheral neuropathy   . Thyroid disease   . Tremor     Current Outpatient Medications:  .  ALPRAZolam (XANAX) 1 MG tablet, TAKE ONE TABLET BY MOUTH THREE TIMES A DAY AS NEEDED FOR ANXIETY, Disp: 90 tablet, Rfl: 2 .  amLODipine (NORVASC) 5 MG tablet, Take 5 mg by mouth daily., Disp: , Rfl:  .  aspirin (ASPIR-LOW) 81 MG EC tablet, Take 81 mg by mouth daily. , Disp: , Rfl:  .  atorvastatin (LIPITOR) 20 MG tablet, Take 20 mg by mouth at bedtime., Disp: , Rfl:  .  botulinum toxin Type A (BOTOX) 100 units SOLR injection, Inject 100 Units into the muscle every 3 (three) months., Disp: 1 each, Rfl: 3 .  carbidopa-levodopa (SINEMET IR) 25-100 MG tablet, Take 1.5 tablets by mouth 3 (three) times daily., Disp: 400 tablet, Rfl:  3 .  Carbidopa-Levodopa ER (SINEMET CR) 25-100 MG tablet controlled release, Take 1 tablet by mouth at bedtime., Disp: 30 tablet, Rfl: 11 .  Cholecalciferol (VITAMIN D3) 1000 units CAPS, Take 1,000 Units by mouth daily., Disp: , Rfl:  .  collagenase (SANTYL) ointment, Apply 1 application topically daily., Disp: 30 g, Rfl: 0 .  collagenase (SANTYL) ointment, Apply 1 application topically daily., Disp: 15 g, Rfl: 0 .  doxycycline (VIBRA-TABS) 100 MG tablet, Take 1 tablet (100 mg total) by mouth 2 (two) times daily., Disp: 30 tablet, Rfl: 1 .  doxycycline (VIBRA-TABS) 100 MG tablet, Take 1 tablet (100 mg total) by mouth 2 (two) times daily., Disp: 20 tablet, Rfl: 0 .  gabapentin (NEURONTIN) 100 MG capsule, Take 100 mg by mouth at bedtime. , Disp: , Rfl:  .  levothyroxine (SYNTHROID) 75 MCG tablet, TAKE ONE TABLET BY MOUTH EVERY MORNING, Disp: 90 tablet, Rfl: 0 .  lithium carbonate 300 MG capsule, Take 2 capsules (600 mg total) by mouth at bedtime., Disp: 60 capsule, Rfl: 5 .  Polyethyl Glycol-Propyl Glycol (SYSTANE OP), Place 1 drop into both eyes 4 (four) times daily as needed (dry eyes)., Disp: , Rfl:  .  polyethylene glycol (MIRALAX / GLYCOLAX) 17 g packet, Take 17 g by mouth daily., Disp: , Rfl:  .  risperidone (RISPERDAL) 4 MG  tablet, TAKE ONE TABLET BY MOUTH EVERY NIGHT AT BEDTIME (Patient taking differently: Take 4 mg by mouth at bedtime. ), Disp: 60 tablet, Rfl: 3 .  traZODone (DESYREL) 50 MG tablet, TAKE 1-2 TABLETS BY MOUTH AT BEDTIME AS NEEDED (Patient taking differently: Take 50 mg by mouth at bedtime. ), Disp: 180 tablet, Rfl: 0  Social History   Tobacco Use  Smoking Status Former Smoker  . Packs/day: 0.10  . Types: Cigarettes  . Quit date: 05/29/2019  . Years since quitting: 1.2  Smokeless Tobacco Never Used  Tobacco Comment   only occasionally smokes    Allergies  Allergen Reactions  . Codeine Other (See Comments)    Agitation/mean   . Lamisil [Terbinafine Hcl]     Lost  taste buds for 2 years   Objective:  There were no vitals filed for this visit. There is no height or weight on file to calculate BMI. Constitutional Well developed. Well nourished.  Vascular Dorsalis pedis pulses diminished bilaterally. Posterior tibial pulses diminished bilaterally. Capillary refill normal to all digits.  No cyanosis or clubbing noted. Pedal hair growth normal.  Neurologic Normal speech. Oriented to person, place, and time. Protective sensation absent  Dermatologic Wound Location: Left heel ulcer with fat layer exposed Wound Base: Necrotic eschar Peri-wound: Normal Exudate: None: wound tissue dry   Orthopedic: No pain to palpation either foot.   Radiographs: None Assessment:   1. 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  Left heel pressure ulcer with fat layer exposed~regressing -Debridement as below. -Dressed Betadine wet-to-dry -Continue off-loading with pillows to the back of the leg as well as offloading boot.  If unable to resolve patient will benefit from cam boot immobilization   -Patient has small vessel disease past the ankle level which is making the heel very difficult to heal.  At this point continue local wound care seems to be the Santyl dressing is helping. -Cam boot was dispensed to completely immobilize the heel.  He does have some difficulty walking due to immobility because of his Parkinson's.  However patient states he does ambulate.  I have asked the patient ambulate only with the boot on.  Patient states understanding -Continue local wound care for now. -The wound was improving since last visit.  Patient will need to return to doing Betadine wet-to-dry dressing changes  Procedure: Excisional Debridement of Wound~regressing Rationale: Removal of non-viable soft tissue from the wound to  promote healing.  Anesthesia: none Pre-Debridement Wound Measurements: 0.5 cm x 0.4 cm x 0.3 cm post-Debridement Wound Measurements: 0.6 cm x 0.5 cm x 0.3 cm Type of Debridement: Sharp Excisional Tissue Removed: Non-viable soft tissue Depth of Debridement: subcutaneous tissue. Technique: Sharp excisional debridement to bleeding, viable wound base.  Dressing: Dry, sterile, compression dressing. Disposition: Patient tolerated procedure well. Patient to return in 1 week for follow-up.  No follow-ups on file.

## 2020-09-27 ENCOUNTER — Ambulatory Visit (INDEPENDENT_AMBULATORY_CARE_PROVIDER_SITE_OTHER): Payer: Medicare Other | Admitting: Podiatry

## 2020-09-27 ENCOUNTER — Other Ambulatory Visit: Payer: Self-pay

## 2020-09-27 DIAGNOSIS — E08621 Diabetes mellitus due to underlying condition with foot ulcer: Secondary | ICD-10-CM | POA: Diagnosis not present

## 2020-09-27 DIAGNOSIS — I999 Unspecified disorder of circulatory system: Secondary | ICD-10-CM

## 2020-09-27 DIAGNOSIS — L97421 Non-pressure chronic ulcer of left heel and midfoot limited to breakdown of skin: Secondary | ICD-10-CM

## 2020-09-30 ENCOUNTER — Other Ambulatory Visit: Payer: Self-pay | Admitting: Physician Assistant

## 2020-10-01 ENCOUNTER — Encounter: Payer: Self-pay | Admitting: Podiatry

## 2020-10-01 NOTE — Progress Notes (Signed)
Subjective:  Patient ID: [name], male    DOB: [birth year],  MRN: [id number]  Chief Complaint  Patient presents with  . Wound Check    left heel. PT stated that he is doing okay. NO major concerns just ready for it to heel .    59 y.o. male presents for wound care.  Patient presents with a complaint of left heel ulceration.    It appears the patient is not offloading adequately when he is lying down.  I discussed with him the importance of offloading it seems like the wound is getting worse because of decreased offloading.  Patient states understanding and will offload more aggressively  Patient underwent left lower extremity angiogram for which the results were \\No  significant stenosis of the femoral-popliteal segment.  3 tibial arteries are patent proximally.  The AT and the PT are normal caliber to the ankle.  The peroneal artery is small caliber throughout.  There is no target for intervention.    Review of Systems: Negative except as noted in the HPI. Denies N/V/F/Ch.  Past Medical History:  Diagnosis Date  . Anxiety   . Bipolar 1 disorder ([place])   . Depression   . High blood pressure   . High cholesterol   . Parkinson's disease ([place])   . Peripheral neuropathy   . Thyroid disease   . Tremor     Current Outpatient Medications:  .  ALPRAZolam (XANAX) 1 MG tablet, TAKE ONE TABLET BY MOUTH THREE TIMES A DAY AS NEEDED FOR ANXIETY, Disp: 90 tablet, Rfl: 2 .  amLODipine (NORVASC) 5 MG tablet, Take 5 mg by mouth daily., Disp: , Rfl:  .  aspirin (ASPIR-LOW) 81 MG EC tablet, Take 81 mg by mouth daily. , Disp: , Rfl:  .  atorvastatin (LIPITOR) 20 MG tablet, Take 20 mg by mouth at bedtime., Disp: , Rfl:  .  botulinum toxin Type A (BOTOX) 100 units SOLR injection, Inject 100 Units into the muscle every 3 (three) months., Disp: 1 each, Rfl: 3 .  carbidopa-levodopa (SINEMET IR) 25-100 MG tablet, Take 1.5 tablets by mouth 3 (three) times daily., Disp: 400 tablet, Rfl: 3 .   Carbidopa-Levodopa ER (SINEMET CR) 25-100 MG tablet controlled release, Take 1 tablet by mouth at bedtime., Disp: 30 tablet, Rfl: 11 .  Cholecalciferol (VITAMIN D3) 1000 units CAPS, Take 1,000 Units by mouth daily., Disp: , Rfl:  .  collagenase (SANTYL) ointment, Apply 1 application topically daily., Disp: 30 g, Rfl: 0 .  collagenase (SANTYL) ointment, Apply 1 application topically daily., Disp: 15 g, Rfl: 0 .  doxycycline (VIBRA-TABS) 100 MG tablet, Take 1 tablet (100 mg total) by mouth 2 (two) times daily., Disp: 30 tablet, Rfl: 1 .  doxycycline (VIBRA-TABS) 100 MG tablet, Take 1 tablet (100 mg total) by mouth 2 (two) times daily., Disp: 20 tablet, Rfl: 0 .  gabapentin (NEURONTIN) 100 MG capsule, Take 100 mg by mouth at bedtime. , Disp: , Rfl:  .  levothyroxine (SYNTHROID) 75 MCG tablet, TAKE ONE TABLET BY MOUTH EVERY MORNING, Disp: 90 tablet, Rfl: 0 .  lithium carbonate 300 MG capsule, Take 2 capsules (600 mg total) by mouth at bedtime., Disp: 60 capsule, Rfl: 5 .  Polyethyl Glycol-Propyl Glycol (SYSTANE OP), Place 1 drop into both eyes 4 (four) times daily as needed (dry eyes)., Disp: , Rfl:  .  polyethylene glycol (MIRALAX / GLYCOLAX) 17 g packet, Take 17 g by mouth daily., Disp: , Rfl:  .  risperidone (RISPERDAL) 4 MG  tablet, TAKE ONE TABLET BY MOUTH EVERY NIGHT AT BEDTIME (Patient taking differently: Take 4 mg by mouth at bedtime. ), Disp: 60 tablet, Rfl: 3 .  traZODone (DESYREL) 50 MG tablet, TAKE 1-2 TABLETS BY MOUTH AT BEDTIME AS NEEDED (Patient taking differently: Take 50 mg by mouth at bedtime. ), Disp: 180 tablet, Rfl: 0  Social History   Tobacco Use  Smoking Status Former Smoker  . Packs/day: 0.10  . Types: Cigarettes  . Quit date: 05/29/2019  . Years since quitting: 1.3  Smokeless Tobacco Never Used  Tobacco Comment   only occasionally smokes    Allergies  Allergen Reactions  . Codeine Other (See Comments)    Agitation/mean   . Lamisil [Terbinafine Hcl]     Lost taste  buds for 2 years   Objective:  There were no vitals filed for this visit. There is no height or weight on file to calculate BMI. Constitutional Well developed. Well nourished.  Vascular Dorsalis pedis pulses diminished bilaterally. Posterior tibial pulses diminished bilaterally. Capillary refill normal to all digits.  No cyanosis or clubbing noted. Pedal hair growth normal.  Neurologic Normal speech. Oriented to person, place, and time. Protective sensation absent  Dermatologic Wound Location: Left heel ulcer now with limited breakdown to the skin. Wound Base: Necrotic eschar Peri-wound: Normal Exudate: None: wound tissue dry   Orthopedic: No pain to palpation either foot.   Radiographs: None Assessment:   1. 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 Vascular abnormality    Plan:  Patient was evaluated and treated and all questions answered.  Left heel pressure ulcer with limited to the breakdown of the skin~regressing -Debridement as below. -Dressed Betadine wet-to-dry -Continue off-loading with pillows to the back of the leg as well as offloading boot.  If unable to resolve patient will benefit from cam boot immobilization   -Patient has small vessel disease past the ankle level which is making the heel very difficult to heal.  At this point continue local wound care seems to be the Santyl dressing is helping. -Cam boot was dispensed to completely immobilize the heel.  He does have some difficulty walking due to immobility because of his Parkinson's.  However patient states he does ambulate.  I have asked the patient ambulate only with the boot on.  Patient states understanding -Continue local wound care for now. -The wound was improving since last visit.  Patient will need to return to doing Betadine wet-to-dry dressing changes  Procedure: Excisional Debridement of  Wound~regressing Rationale: Removal of non-viable soft tissue from the wound to promote healing.  Anesthesia: none Pre-Debridement Wound Measurements: 0.4 cm x 0.4 cm x 0.3 cm post-Debridement Wound Measurements: 0.5 cm x 0.5 cm x 0.3 cm Type of Debridement: Sharp Excisional Tissue Removed: Non-viable soft tissue Depth of Debridement: Limited to the breakdown of the skin Technique: Sharp excisional debridement to bleeding, viable wound base.  Dressing: Dry, sterile, compression dressing. Disposition: Patient tolerated procedure well. Patient to return in 1 week for follow-up.  No follow-ups on file.

## 2020-10-09 ENCOUNTER — Ambulatory Visit: Payer: Medicare Other | Admitting: Neurology

## 2020-10-09 ENCOUNTER — Encounter: Payer: Self-pay | Admitting: Neurology

## 2020-10-09 VITALS — BP 122/79 | HR 89 | Ht 72.0 in | Wt 150.0 lb

## 2020-10-09 DIAGNOSIS — K117 Disturbances of salivary secretion: Secondary | ICD-10-CM | POA: Diagnosis not present

## 2020-10-09 NOTE — Progress Notes (Signed)
**  Botox 100 units x 1 vial, NDC 2409-7353-29, Lot [id number], Exp 12/2022, office supply.//mck**

## 2020-10-09 NOTE — Progress Notes (Signed)
PATIENT: [name] DOB: December 09, 1960  REASON FOR VISIT: follow up HISTORY FROM: patient  HISTORY OF PRESENT ILLNESS: Today 10/09/20  HISTORY  HISTORY OF PRESENT ILLNESS: He has PMHx of HTN, hypothyroidism, on supplement, bipolar disorder, taking risperidone 4 mg daily for more than ten years, lithium 324m tid, report level was within normal limit, he is also taking alprazolam 156mtid. His psychiatrist is Dr. CuCandis SchatzHe had a lot of stress recently, his son died after struck by a truck in August 2013, his wife suffered stroke in August 2011.  He complains of bilateral hand tremor over the past few years, getting worse since 2013, fairly symmtric, if he holds anything more than 5 Lbs, for more than 10 minutes, he has right arm and hand shaking, he spills water when holding a glass of water. He denies significant gait difficulty, denies loss sense of smell. He denies family history of tremor.  Lab in July 25th 2014, showed normal CBC.   MRI of the brain done 07/19/2013 without acute findings. His tremor gets worse if he is anxious.  UPDATE Sep 9th 2015:He has gradually declining functional status, he is no longer working, sit at home most of the time, is no longer driving, he was recently evaluated by his psychiatrist Dr. CuCandis Schatzn June 2015, Lithium was decreased from 90067maily to 600 every day, he is also taking Risperdal 4 mg every day He has worsening memory trouble, constipation, restless he also reported worsening depression, to the point of suicidal thought sometimes   UPDATE Oct 26 2014: Sinemet 25/100 3 times a day since Sep 2015, has been very helpful, his tremor has much improved, he continue has mild unsteady gait, recently complains of bilateral heel pain, thick skin at left medial heel.He is still taking Risperdal 4 mg every night, Xanax 1 mg half to one tablet as needed, lithium 300 mg 2 tablets every night, Synthroid 75 g once every day  UPDATE Oct 15 2015: His tremor is better with sinemet, He complains of right foot pain, planning on to have surgery soon. Today his wife is also concerned about his slow worsening gait difficulty, unsteady gait, difficulty to clear his feet from the floor, worsening bilateral upper extremity weakness, bilateral deltoid muscle wasting   UPDATE January 28 2016: EMG nerve conduction study in October 30 2015 was normal, there was no evidence of large fiber peripheral neuropathy, right lumbar or right cervical radiculopathy. He is planning on to have right foot surgery for benign mass in January 30 2016  He is still on Sinemet 25/100 mg 3 times a day, which does help his tremor  UPDATE Jul 30 2016: Patient reported increased tremor, bradykinesia, memory loss since his lithium was increased from 600 mg to 900 mg every night per family this is based on laboratory evaluation, there is no significant change on patient mood,  In addition he is also taking Risperdal 4 mg once every night, trazodone 50 mg every night, thyroid supplement. He is very frustrated about his slow movement, memory loss, bilateral hands tremor, cannot even clip his fingernails  He is under [facility] of [facility] PA TerDonnal Moatone ([phone] April 29 2017: He is with his wife, he has worsening memory loss, "just sit there", Watch TV, he needs help dressing, bathing, wear depends. He drinks 3 boost a day, lack of appetite, he sleeps well.  He is now back to lithium 600m33mily, He is so flat.  His wife is going to retire April 30 2017 to take care of him.  This is a sudden change compared to 2012-02-13, he was a Librarian, academic of a warehouse, this a sudden change since his son died of accident in Feb 13, 2012. He had diagnosis of bipolar disorder since 2008/02/13,   We have reviewed repeat CT chest without contrast on June 12th 2018: Stable right lung base subpleural mass,  I was able to talk with his psychiatrist PA Donnal Moat,, updated on the information, and also concerned about medication side effect  UPDATE Sept 11 2018:  He is accompanied by his wife at today's clinical visit, wife is apparently very frustrated about his flat affect, lack of motivations,he needs more help inhis daily activity, personal care  He is on lower dose of risperidone 18m qhs, trazodone 512mqhs, Lithium 60075mhs, he continue to take Sinemet 25/100 mg 3 times a day, which has helped his hand tremor.  I personally reviewed MRI of the brain on May 09 2017, there is generalized atrophy mild supratentorial small vessel disease Extensive laboratory evaluation showed normal ESR, CPK, CBC showed mild elevated WBC 11 point 9, vitamin D was decreased 18 point 7, CMP showed mild elevated glucose 107  Update May 23, 2020: Patient was diagnosed with secondary Parkinson's disease, due to his long-term neurotrophic medication treatment, he was started on Sinemet 25/100 mg 3 times daily since [id number] did help his symptoms,  Personally reviewed MRI of the brain most recent was in June 2018, generalized atrophy, supratentorium small vessel disease,  His Sinemet was increased from 1-1 and half 3 times a day due to his reported increased gait abnormality, drooling,   Higher dose of Sinemet, did help his movement, but he is dealing with nonhealing left foot ulcer, has peripheral vascular disease, has pending procedure  Wife also reported that he has difficulty turning his body on bed, has to wake her up to help him using bathroom, he also had excessive drooling for more than 1 year, drooling into his plate, on his shirt, on the floor  UPDATE July 15 2020: This is his first Botox injection for hypersialorrhea, potential mechanical symptoms side effect explained, he tolerated the procedure well, used Botox a 100 units  Following last visit in July 2021, because of his complaints of difficulty turning at bedtime, restarted Sinemet CR  25/100 mg at bedtime, wife noticed that he continue have trouble sleeping, turning, also had excessive daytime sleepiness, there was no significant improvement adding on Sinemet CR, will stop it today, continue immediate release Sinemet 25/100 mg 1 and half tablets 3 times a day at 9 a.m., 1 PM, 5 PM  REVIEW OF SYSTEMS: Out of a complete 14 system review of symptoms, the patient complains only of the following symptoms, and all other reviewed systems are negative.  Gait instability, falls  ALLERGIES: Allergies  Allergen Reactions  . Codeine Other (See Comments)    Agitation/mean   . Lamisil [Terbinafine Hcl]     Lost taste buds for 2 years    HOME MEDICATIONS: Outpatient Medications Prior to Visit  Medication Sig Dispense Refill  . ALPRAZolam (XANAX) 1 MG tablet TAKE ONE TABLET BY MOUTH THREE TIMES A DAY AS NEEDED FOR ANXIETY 90 tablet 2  . amLODipine (NORVASC) 5 MG tablet Take 5 mg by mouth daily.    . aMarland Kitchenpirin (ASPIR-LOW) 81 MG EC tablet Take 81 mg by mouth daily.     . aMarland Kitchenorvastatin (LIPITOR) 20 MG tablet Take  20 mg by mouth at bedtime.    . botulinum toxin Type A (BOTOX) 100 units SOLR injection Inject 100 Units into the muscle every 3 (three) months. 1 each 3  . carbidopa-levodopa (SINEMET IR) 25-100 MG tablet Take 1.5 tablets by mouth 3 (three) times daily. 400 tablet 3  . Carbidopa-Levodopa ER (SINEMET CR) 25-100 MG tablet controlled release Take 1 tablet by mouth at bedtime. 30 tablet 11  . Cholecalciferol (VITAMIN D3) 1000 units CAPS Take 1,000 Units by mouth daily.    . collagenase (SANTYL) ointment Apply 1 application topically daily. 30 g 0  . collagenase (SANTYL) ointment Apply 1 application topically daily. 15 g 0  . doxycycline (VIBRA-TABS) 100 MG tablet Take 1 tablet (100 mg total) by mouth 2 (two) times daily. 30 tablet 1  . doxycycline (VIBRA-TABS) 100 MG tablet Take 1 tablet (100 mg total) by mouth 2 (two) times daily. 20 tablet 0  . gabapentin (NEURONTIN) 100 MG  capsule Take 100 mg by mouth at bedtime.     Marland Kitchen levothyroxine (SYNTHROID) 75 MCG tablet TAKE ONE TABLET BY MOUTH EVERY MORNING 90 tablet 0  . lithium carbonate 300 MG capsule Take 2 capsules (600 mg total) by mouth at bedtime. 60 capsule 5  . Polyethyl Glycol-Propyl Glycol (SYSTANE OP) Place 1 drop into both eyes 4 (four) times daily as needed (dry eyes).    . polyethylene glycol (MIRALAX / GLYCOLAX) 17 g packet Take 17 g by mouth daily.    . risperidone (RISPERDAL) 4 MG tablet TAKE ONE TABLET BY MOUTH EVERY NIGHT AT BEDTIME 60 tablet 3  . traZODone (DESYREL) 50 MG tablet TAKE 1-2 TABLETS BY MOUTH AT BEDTIME AS NEEDED (Patient taking differently: Take 50 mg by mouth at bedtime. ) 180 tablet 0   No facility-administered medications prior to visit.    PAST MEDICAL HISTORY: Past Medical History:  Diagnosis Date  . Anxiety   . Bipolar 1 disorder ([place])   . Depression   . High blood pressure   . High cholesterol   . Parkinson's disease ([place])   . Peripheral neuropathy   . Thyroid disease   . Tremor     PAST SURGICAL HISTORY: Past Surgical History:  Procedure Laterality Date  . HEMORRHOID SURGERY     1980's  . IR ANGIOGRAM EXTREMITY LEFT  05/28/2020  . IR RADIOLOGIST EVAL & MGMT  03/14/2020  . IR US GUIDE VASC ACCESS LEFT  05/28/2020  . MULTIPLE TOOTH EXTRACTIONS     2016 Has top plate    FAMILY HISTORY: Family History  Problem Relation Age of Onset  . Heart attack Mother   . Liver disease Father     SOCIAL HISTORY: Social History   Socioeconomic History  . Marital status: Married    Spouse name: [name]  . Number of children: 1  . Years of education: 78  . Highest education level: Not on file  Occupational History    Comment: Not working  Tobacco Use  . Smoking status: Former Smoker    Packs/day: 0.10    Types: Cigarettes    Quit date: 05/29/2019    Years since quitting: 1.3  . Smokeless tobacco: Never Used  . Tobacco comment: only occasionally smokes  Vaping Use  .  Vaping Use: Never used  Substance and Sexual Activity  . Alcohol use: No  . Drug use: No    Comment: Quit 2009  . Sexual activity: Not on file  Other Topics Concern  . Not on file  Social History Narrative   Patient lives at home with with his wife [name]). Patient is not working at this time. Patient has a 12th grade education.    Caffeine - four mountain dews daily.   Right handed.   Patient has one child.   Social Determinants of Health   Financial Resource Strain:   . Difficulty of Paying Living Expenses: Not on file  Food Insecurity:   . Worried About Charity fundraiser in the Last Year: Not on file  . Ran Out of Food in the Last Year: Not on file  Transportation Needs:   . Lack of Transportation (Medical): Not on file  . Lack of Transportation (Non-Medical): Not on file  Physical Activity:   . Days of Exercise per Week: Not on file  . Minutes of Exercise per Session: Not on file  Stress:   . Feeling of Stress : Not on file  Social Connections:   . Frequency of Communication with Friends and Family: Not on file  . Frequency of Social Gatherings with Friends and Family: Not on file  . Attends Religious Services: Not on file  . Active Member of Clubs or Organizations: Not on file  . Attends Archivist Meetings: Not on file  . Marital Status: Not on file  Intimate Partner Violence:   . Fear of Current or Ex-Partner: Not on file  . Emotionally Abused: Not on file  . Physically Abused: Not on file  . Sexually Abused: Not on file    PHYSICAL EXAM  Vitals:   10/09/20 1048  BP: 122/79  Pulse: 89  Weight: 150 lb (68 kg)  Height: 6' (1.829 m)   Body mass index is 20.34 kg/m.  Generalized: Well developed, in no acute distress  MMSE - Mini Mental State Exam 11/22/2019 11/02/2018 07/27/2017  Not completed: - (No Data) -  Orientation to time '5 3 5  ' Orientation to Place '4 5 5  ' Registration '3 3 3  ' Attention/ Calculation '1 5 5  ' Recall '2 1 3  ' Language- name 2  objects '2 2 2  ' Language- repeat '1 1 1  ' Language- follow 3 step command '3 2 3  ' Language- follow 3 step command-comments - took the paper in his left hand -  Language- read & follow direction '1 1 1  ' Write a sentence '1 1 1  ' Copy design '1 1 1  ' Total score '24 25 30     ' PHYSICAL EXAMNIATION:  Gen: NAD, conversant, well nourised, well groomed                     Cardiovascular: Regular rate rhythm, no peripheral edema, warm, nontender. Eyes: Conjunctivae clear without exudates or hemorrhage Neck: Supple, no carotid bruits. Pulmonary: Clear to auscultation bilaterally   NEUROLOGICAL EXAM:  MENTAL STATUS: Speech/Cognition: Awake, alert, normal speech, oriented to history taking and casual conversation.  CRANIAL NERVES: CN II: Visual fields are full to confrontation.  Pupils are round equal and briskly reactive to light. CN III, IV, VI: extraocular movement are normal. No ptosis. CN V: Facial sensation is intact to light touch. CN VII: Face is symmetric with normal eye closure and smile. CN VIII: Hearing is normal to casual conversation CN IX, X: Palate elevates symmetrically. Phonation is normal. CN XI: Head turning and shoulder shrug are intact CN XII: Tongue is midline with normal movements and no atrophy.  MOTOR: Normal muscle strengths, moderate rigidity bilaterally, bradykinesia  REFLEXES: Reflexes are 2  and  symmetric at the biceps, triceps, knees and ankles. Plantar responses are flexor.  SENSORY: Intact to light touch, pinprick, positional and vibratory sensation at fingers and toes.  COORDINATION: There is no trunk or limb ataxia.    GAIT/STANCE: Need to push up to get up from seated position, wearing left boot, unsteady, decreased bilateral arm swing  DIAGNOSTIC DATA (LABS, IMAGING, TESTING) - I reviewed patient records, labs, notes, testing and imaging myself where available.  Lab Results  Component Value Date   WBC 11.0 (H) 05/28/2020   HGB 14.1 05/28/2020    HCT 43.9 05/28/2020   MCV 91.6 05/28/2020   PLT 229 05/28/2020      Component Value Date/Time   NA 139 05/28/2020 0735   NA 143 04/29/2017 0827   K 3.9 05/28/2020 0735   CL 105 05/28/2020 0735   CO2 23 05/28/2020 0735   GLUCOSE 104 (H) 05/28/2020 0735   BUN 12 05/28/2020 0735   BUN 10 04/29/2017 0827   CREATININE 0.99 05/28/2020 0735   CALCIUM 9.5 05/28/2020 0735   PROT 7.3 04/29/2017 0827   ALBUMIN 4.7 04/29/2017 0827   AST 12 04/29/2017 0827   ALT 14 04/29/2017 0827   ALKPHOS 116 04/29/2017 0827   BILITOT 0.5 04/29/2017 0827   GFRNONAA >60 05/28/2020 0735   GFRAA >60 05/28/2020 0735   No results found for: CHOL, HDL, LDLCALC, LDLDIRECT, TRIG, CHOLHDL No results found for: HGBA1C Lab Results  Component Value Date   VITAMINB12 782 04/29/2017   Lab Results  Component Value Date   TSH 4.410 04/29/2017      ASSESSMENT AND PLAN 59 y.o. year old male   Secondary parkinsonism  Fairly symmetric, history of long-term psychiatric medication treatment, including Risperdal, lithium,  Was treated with Sinemet 25/100 mg since 2018, did report improvement, more gait improvement with higher dose of 1.5 mg 3 times a day, taking it at 9 AM, 1 PM, 5 PM,  There was no significant change after add on Sinemet CR 25/100 mg every night, wife noticed excessive sleepiness during the daytime, will stop Sinemet CR today  Gait abnormality  Multiple factorial, parkinsonian features, left foot pain, wearing left boot  Peripheral vascular disease  He has vascular risk factor of sedentary lifestyle, hyperlipidemia, encourage increased water intake, aspirin 81 mg daily,  May consider refer him to physical therapy after his foot ulcer healed  Memory loss  Stable  Excessive drooling  Botox a 100 mg for hypersialorrhea, 40 units to each parotid gland, divided into 3 injection sites, 10 units to each submandibular gland   He will return to clinic in 3 months for repeat  injection     Marcial Pacas, M.D. Ph.D.  [facility] [place], [place] [address] Phone: [phone] Fax:      [phone]

## 2020-10-13 ENCOUNTER — Other Ambulatory Visit: Payer: Self-pay | Admitting: Neurology

## 2020-10-15 ENCOUNTER — Other Ambulatory Visit: Payer: Self-pay | Admitting: Internal Medicine

## 2020-10-15 DIAGNOSIS — Z0000 Encounter for general adult medical examination without abnormal findings: Secondary | ICD-10-CM

## 2020-10-25 ENCOUNTER — Other Ambulatory Visit: Payer: Self-pay

## 2020-10-25 ENCOUNTER — Ambulatory Visit: Payer: Medicare Other | Admitting: Podiatry

## 2020-10-25 DIAGNOSIS — E08621 Diabetes mellitus due to underlying condition with foot ulcer: Secondary | ICD-10-CM

## 2020-10-25 DIAGNOSIS — L97421 Non-pressure chronic ulcer of left heel and midfoot limited to breakdown of skin: Secondary | ICD-10-CM | POA: Diagnosis not present

## 2020-10-29 ENCOUNTER — Encounter: Payer: Self-pay | Admitting: Podiatry

## 2020-10-29 NOTE — Progress Notes (Signed)
Subjective:  Patient ID: [name], male    DOB: [birth year],  MRN: [id number]  Chief Complaint  Patient presents with  . Wound Check    Left heel wound check. PT stated that he is doing well he has no concerns at this time.    59 y.o. male presents for wound care.  Patient presents with follow-up of left toe ulceration which has now completely healed.  He has no acute complaints he denies any other acute problems.   Patient underwent left lower extremity angiogram for which the results were \\No  significant stenosis of the femoral-popliteal segment.  3 tibial arteries are patent proximally.  The AT and the PT are normal caliber to the ankle.  The peroneal artery is small caliber throughout.  There is no target for intervention.    Review of Systems: Negative except as noted in the HPI. Denies N/V/F/Ch.  Past Medical History:  Diagnosis Date  . Anxiety   . Bipolar 1 disorder ([place])   . Depression   . High blood pressure   . High cholesterol   . Parkinson's disease ([place])   . Peripheral neuropathy   . Thyroid disease   . Tremor     Current Outpatient Medications:  .  ALPRAZolam (XANAX) 1 MG tablet, TAKE ONE TABLET BY MOUTH THREE TIMES A DAY AS NEEDED FOR ANXIETY, Disp: 90 tablet, Rfl: 2 .  amLODipine (NORVASC) 5 MG tablet, Take 5 mg by mouth daily., Disp: , Rfl:  .  aspirin (ASPIR-LOW) 81 MG EC tablet, Take 81 mg by mouth daily. , Disp: , Rfl:  .  atorvastatin (LIPITOR) 20 MG tablet, Take 20 mg by mouth at bedtime., Disp: , Rfl:  .  botulinum toxin Type A (BOTOX) 100 units SOLR injection, Inject 100 Units into the muscle every 3 (three) months., Disp: 1 each, Rfl: 3 .  carbidopa-levodopa (SINEMET IR) 25-100 MG tablet, TAKE 1 AND 1/2 TABLET BY MOUTH THREE TIMES A DAY, Disp: 400 tablet, Rfl: 1 .  Carbidopa-Levodopa ER (SINEMET CR) 25-100 MG tablet controlled release, Take 1 tablet by mouth at bedtime., Disp: 30 tablet, Rfl: 11 .  Cholecalciferol (VITAMIN D3) 1000 units CAPS,  Take 1,000 Units by mouth daily., Disp: , Rfl:  .  collagenase (SANTYL) ointment, Apply 1 application topically daily., Disp: 30 g, Rfl: 0 .  collagenase (SANTYL) ointment, Apply 1 application topically daily., Disp: 15 g, Rfl: 0 .  doxycycline (VIBRA-TABS) 100 MG tablet, Take 1 tablet (100 mg total) by mouth 2 (two) times daily., Disp: 30 tablet, Rfl: 1 .  doxycycline (VIBRA-TABS) 100 MG tablet, Take 1 tablet (100 mg total) by mouth 2 (two) times daily., Disp: 20 tablet, Rfl: 0 .  gabapentin (NEURONTIN) 100 MG capsule, Take 100 mg by mouth at bedtime. , Disp: , Rfl:  .  levothyroxine (SYNTHROID) 75 MCG tablet, TAKE ONE TABLET BY MOUTH EVERY MORNING, Disp: 90 tablet, Rfl: 0 .  lithium carbonate 300 MG capsule, Take 2 capsules (600 mg total) by mouth at bedtime., Disp: 60 capsule, Rfl: 5 .  Polyethyl Glycol-Propyl Glycol (SYSTANE OP), Place 1 drop into both eyes 4 (four) times daily as needed (dry eyes)., Disp: , Rfl:  .  polyethylene glycol (MIRALAX / GLYCOLAX) 17 g packet, Take 17 g by mouth daily., Disp: , Rfl:  .  risperidone (RISPERDAL) 4 MG tablet, TAKE ONE TABLET BY MOUTH EVERY NIGHT AT BEDTIME, Disp: 60 tablet, Rfl: 3 .  traZODone (DESYREL) 50 MG tablet, TAKE 1-2 TABLETS BY MOUTH AT  BEDTIME AS NEEDED (Patient taking differently: Take 50 mg by mouth at bedtime. ), Disp: 180 tablet, Rfl: 0  Social History   Tobacco Use  Smoking Status Former Smoker  . Packs/day: 0.10  . Types: Cigarettes  . Quit date: 05/29/2019  . Years since quitting: 1.4  Smokeless Tobacco Never Used  Tobacco Comment   only occasionally smokes    Allergies  Allergen Reactions  . Codeine Other (See Comments)    Agitation/mean   . 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 There is no height or weight on file to calculate BMI. Constitutional Well developed. Well nourished.  Vascular Dorsalis pedis pulses diminished  bilaterally. Posterior tibial pulses diminished bilaterally. Capillary refill normal to all digits.  No cyanosis or clubbing noted. Pedal hair growth normal.  Neurologic Normal speech. Oriented to person, place, and time. Protective sensation absent  Dermatologic  left heel ulcer completely reepithelialized without any concern for ulceration.  No further wound noted.  No clinical signs of infection noted.   Orthopedic: No pain to palpation either foot.   Radiographs: None Assessment:   1. 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  Left heel pressure ulcer ~healed -Clinically healed.  No further wound noted.  I instructed him to continue wearing the boot to offload it until he is able to get into offloading brace that can take the pressure off of the heel. -He will be scheduled see rec for offloading brace to take the pressure off of the heel to allow him to walk within normal with normal gait limb is given that he has Parkinson's.  Antalgic gait -I rest of the patient intolerant etiology of antalgic gait and various treatment options were discussed.  Given the patient has a history of Parkinson's with excessive pressure to the heel now that we were able to clinically healed the wound I believe she will benefit from bracing to help offload the posterior heel. -Family scheduled see rec for offloading brace  No follow-ups on file.

## 2020-11-01 ENCOUNTER — Ambulatory Visit
Admission: RE | Admit: 2020-11-01 | Discharge: 2020-11-01 | Disposition: A | Discharge status: Home / Self Care | Payer: Medicare Other | Source: Ambulatory Visit | Attending: Internal Medicine | Admitting: Internal Medicine

## 2020-11-01 ENCOUNTER — Other Ambulatory Visit: Payer: Self-pay

## 2020-11-01 DIAGNOSIS — Z0000 Encounter for general adult medical examination without abnormal findings: Secondary | ICD-10-CM

## 2020-11-04 ENCOUNTER — Other Ambulatory Visit: Payer: Self-pay

## 2020-11-04 ENCOUNTER — Ambulatory Visit (INDEPENDENT_AMBULATORY_CARE_PROVIDER_SITE_OTHER): Payer: Medicare Other | Admitting: Orthotics

## 2020-11-04 DIAGNOSIS — L97422 Non-pressure chronic ulcer of left heel and midfoot with fat layer exposed: Secondary | ICD-10-CM | POA: Diagnosis not present

## 2020-11-04 DIAGNOSIS — I739 Peripheral vascular disease, unspecified: Secondary | ICD-10-CM

## 2020-11-04 DIAGNOSIS — L97421 Non-pressure chronic ulcer of left heel and midfoot limited to breakdown of skin: Secondary | ICD-10-CM

## 2020-11-04 DIAGNOSIS — I999 Unspecified disorder of circulatory system: Secondary | ICD-10-CM

## 2020-11-04 DIAGNOSIS — E08621 Diabetes mellitus due to underlying condition with foot ulcer: Secondary | ICD-10-CM

## 2020-11-04 DIAGNOSIS — M79671 Pain in right foot: Secondary | ICD-10-CM | POA: Diagnosis not present

## 2020-11-04 NOTE — Progress Notes (Signed)
Cast today for foot orthotic to address chronic ulcer post/medial aspect of left heel.  Richy to [employer]

## 2020-11-25 ENCOUNTER — Ambulatory Visit: Payer: Self-pay | Admitting: Neurology

## 2020-11-27 ENCOUNTER — Other Ambulatory Visit: Payer: Self-pay | Admitting: Physician Assistant

## 2020-12-03 ENCOUNTER — Other Ambulatory Visit: Payer: Medicare Other | Admitting: Orthotics

## 2020-12-11 ENCOUNTER — Other Ambulatory Visit: Payer: Medicare Other | Admitting: Orthotics

## 2020-12-20 ENCOUNTER — Other Ambulatory Visit: Payer: Self-pay

## 2020-12-20 ENCOUNTER — Ambulatory Visit: Payer: Medicare Other | Admitting: Podiatry

## 2020-12-20 DIAGNOSIS — R269 Unspecified abnormalities of gait and mobility: Secondary | ICD-10-CM

## 2020-12-20 DIAGNOSIS — E08621 Diabetes mellitus due to underlying condition with foot ulcer: Secondary | ICD-10-CM | POA: Diagnosis not present

## 2020-12-20 DIAGNOSIS — L97421 Non-pressure chronic ulcer of left heel and midfoot limited to breakdown of skin: Secondary | ICD-10-CM | POA: Diagnosis not present

## 2020-12-23 ENCOUNTER — Other Ambulatory Visit: Payer: Self-pay | Admitting: Physician Assistant

## 2020-12-24 ENCOUNTER — Encounter: Payer: Self-pay | Admitting: Podiatry

## 2020-12-24 NOTE — Progress Notes (Signed)
Subjective:  Patient ID: [name], male    DOB: 1961-03-10,  MRN: [id number]  Chief Complaint  Patient presents with  . Wound Check    Wound check.  Bilateral hallux nail is painful    60 y.o. male presents for wound care.  Patient presents with follow-up of left toe ulceration which has now completely healed.  He has no acute complaints he denies any other acute problems.   Patient underwent left lower extremity angiogram for which the results were \\No  significant stenosis of the femoral-popliteal segment.  3 tibial arteries are patent proximally.  The AT and the PT are normal caliber to the ankle.  The peroneal artery is small caliber throughout.  There is no target for intervention.    Review of Systems: Negative except as noted in the HPI. Denies N/V/F/Ch.  Past Medical History:  Diagnosis Date  . Anxiety   . Bipolar 1 disorder ([place])   . Depression   . High blood pressure   . High cholesterol   . Parkinson's disease ([place])   . Peripheral neuropathy   . Thyroid disease   . Tremor     Current Outpatient Medications:  .  ALPRAZolam (XANAX) 1 MG tablet, TAKE ONE TABLET BY MOUTH THREE TIMES A DAY AS NEEDED FOR ANXIETY, Disp: 90 tablet, Rfl: 5 .  amLODipine (NORVASC) 5 MG tablet, Take 5 mg by mouth daily., Disp: , Rfl:  .  aspirin (ASPIR-LOW) 81 MG EC tablet, Take 81 mg by mouth daily. , Disp: , Rfl:  .  atorvastatin (LIPITOR) 20 MG tablet, Take 20 mg by mouth at bedtime., Disp: , Rfl:  .  botulinum toxin Type A (BOTOX) 100 units SOLR injection, Inject 100 Units into the muscle every 3 (three) months., Disp: 1 each, Rfl: 3 .  carbidopa-levodopa (SINEMET IR) 25-100 MG tablet, TAKE 1 AND 1/2 TABLET BY MOUTH THREE TIMES A DAY, Disp: 400 tablet, Rfl: 1 .  Carbidopa-Levodopa ER (SINEMET CR) 25-100 MG tablet controlled release, Take 1 tablet by mouth at bedtime., Disp: 30 tablet, Rfl: 11 .  Cholecalciferol (VITAMIN D3) 1000 units CAPS, Take 1,000 Units by mouth daily., Disp:  , Rfl:  .  collagenase (SANTYL) ointment, Apply 1 application topically daily., Disp: 30 g, Rfl: 0 .  collagenase (SANTYL) ointment, Apply 1 application topically daily., Disp: 15 g, Rfl: 0 .  doxycycline (VIBRA-TABS) 100 MG tablet, Take 1 tablet (100 mg total) by mouth 2 (two) times daily., Disp: 30 tablet, Rfl: 1 .  doxycycline (VIBRA-TABS) 100 MG tablet, Take 1 tablet (100 mg total) by mouth 2 (two) times daily., Disp: 20 tablet, Rfl: 0 .  gabapentin (NEURONTIN) 100 MG capsule, Take 100 mg by mouth at bedtime. , Disp: , Rfl:  .  levothyroxine (SYNTHROID) 75 MCG tablet, TAKE ONE TABLET BY MOUTH EVERY MORNING, Disp: 90 tablet, Rfl: 0 .  lithium carbonate 300 MG capsule, Take 2 capsules (600 mg total) by mouth at bedtime., Disp: 60 capsule, Rfl: 5 .  Polyethyl Glycol-Propyl Glycol (SYSTANE OP), Place 1 drop into both eyes 4 (four) times daily as needed (dry eyes)., Disp: , Rfl:  .  polyethylene glycol (MIRALAX / GLYCOLAX) 17 g packet, Take 17 g by mouth daily., Disp: , Rfl:  .  risperidone (RISPERDAL) 4 MG tablet, TAKE ONE TABLET BY MOUTH EVERY NIGHT AT BEDTIME, Disp: 60 tablet, Rfl: 3 .  traZODone (DESYREL) 50 MG tablet, TAKE 1-2 TABLETS BY MOUTH AT BEDTIME AS NEEDED (Patient taking differently: Take 50 mg by  mouth at bedtime. ), Disp: 180 tablet, Rfl: 0  Social History   Tobacco Use  Smoking Status Former Smoker  . Packs/day: 0.10  . Types: Cigarettes  . Quit date: 05/29/2019  . Years since quitting: 1.5  Smokeless Tobacco Never Used  Tobacco Comment   only occasionally smokes    Allergies  Allergen Reactions  . Codeine Other (See Comments)    Agitation/mean   . 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 There is no height or weight on file to calculate BMI. Constitutional Well developed. Well nourished.  Vascular Dorsalis pedis pulses diminished bilaterally. Posterior tibial pulses diminished  bilaterally. Capillary refill normal to all digits.  No cyanosis or clubbing noted. Pedal hair growth normal.  Neurologic Normal speech. Oriented to person, place, and time. Protective sensation absent  Dermatologic  left heel ulcer completely reepithelialized without any concern for ulceration.  No further wound noted.  No clinical signs of infection noted.   Orthopedic: No pain to palpation either foot.   Radiographs: None Assessment:   1. 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 Abnormality of gait    Plan:  Patient was evaluated and treated and all questions answered.  Left heel pressure ulcer ~healed -Clinically healed.  No further wound noted.  I instructed him to continue wearing the boot to offload it until he is able to get into offloading brace that can take the pressure off of the heel. -He will be scheduled see rec for offloading brace to take the pressure off of the heel to allow him to walk within normal with normal gait limb is given that he has Parkinson's.  Antalgic gait -I rest of the patient intolerant etiology of antalgic gait and various treatment options were discussed.  Given the patient has a history of Parkinson's with excessive pressure to the heel now that we were able to clinically healed the wound I believe she will benefit from bracing to help offload the posterior heel. -Bracing and shoes were dispensed.  No follow-ups on file.

## 2020-12-28 ENCOUNTER — Encounter: Payer: Self-pay | Admitting: Podiatry

## 2021-01-01 ENCOUNTER — Other Ambulatory Visit: Payer: Self-pay

## 2021-01-01 ENCOUNTER — Ambulatory Visit: Payer: Medicare Other | Admitting: Podiatry

## 2021-01-01 DIAGNOSIS — L89621 Pressure ulcer of left heel, stage 1: Secondary | ICD-10-CM

## 2021-01-02 ENCOUNTER — Encounter: Payer: Self-pay | Admitting: Podiatry

## 2021-01-02 NOTE — Progress Notes (Signed)
Subjective:  Patient ID: [name], male    DOB: 06-04-1961,  MRN: [id number]  Chief Complaint  Patient presents with  . Foot Ulcer    PT is having bruising and is unsure if it is from the orthotics     60 y.o. male presents with the above complaint.  Patient presents with a new complaint of left heel bruising.  Patient states that they are not sure if it is coming from the insole versus the shoes.  The ulcer is still healed.  However there is some bruising around the left heel.  He states that it is slightly painful and tender to touch.  He denies any acute complaints.  He does not recall if he is offloading while he sleeping at night.   Review of Systems: Negative except as noted in the HPI. Denies N/V/F/Ch.  Past Medical History:  Diagnosis Date  . Anxiety   . Bipolar 1 disorder ([place])   . Depression   . High blood pressure   . High cholesterol   . Parkinson's disease ([place])   . Peripheral neuropathy   . Thyroid disease   . Tremor     Current Outpatient Medications:  .  ALPRAZolam (XANAX) 1 MG tablet, TAKE ONE TABLET BY MOUTH THREE TIMES A DAY AS NEEDED FOR ANXIETY, Disp: 90 tablet, Rfl: 5 .  amLODipine (NORVASC) 5 MG tablet, Take 5 mg by mouth daily., Disp: , Rfl:  .  aspirin (ASPIR-LOW) 81 MG EC tablet, Take 81 mg by mouth daily. , Disp: , Rfl:  .  atorvastatin (LIPITOR) 20 MG tablet, Take 20 mg by mouth at bedtime., Disp: , Rfl:  .  botulinum toxin Type A (BOTOX) 100 units SOLR injection, Inject 100 Units into the muscle every 3 (three) months., Disp: 1 each, Rfl: 3 .  carbidopa-levodopa (SINEMET IR) 25-100 MG tablet, TAKE 1 AND 1/2 TABLET BY MOUTH THREE TIMES A DAY, Disp: 400 tablet, Rfl: 1 .  Carbidopa-Levodopa ER (SINEMET CR) 25-100 MG tablet controlled release, Take 1 tablet by mouth at bedtime., Disp: 30 tablet, Rfl: 11 .  Cholecalciferol (VITAMIN D3) 1000 units CAPS, Take 1,000 Units by mouth daily., Disp: , Rfl:  .  collagenase (SANTYL) ointment, Apply 1 application  topically daily., Disp: 30 g, Rfl: 0 .  collagenase (SANTYL) ointment, Apply 1 application topically daily., Disp: 15 g, Rfl: 0 .  doxycycline (VIBRA-TABS) 100 MG tablet, Take 1 tablet (100 mg total) by mouth 2 (two) times daily., Disp: 30 tablet, Rfl: 1 .  doxycycline (VIBRA-TABS) 100 MG tablet, Take 1 tablet (100 mg total) by mouth 2 (two) times daily., Disp: 20 tablet, Rfl: 0 .  gabapentin (NEURONTIN) 100 MG capsule, Take 100 mg by mouth at bedtime. , Disp: , Rfl:  .  levothyroxine (SYNTHROID) 75 MCG tablet, TAKE ONE TABLET BY MOUTH EVERY MORNING, Disp: 90 tablet, Rfl: 0 .  lithium carbonate 300 MG capsule, Take 2 capsules (600 mg total) by mouth at bedtime., Disp: 60 capsule, Rfl: 5 .  Polyethyl Glycol-Propyl Glycol (SYSTANE OP), Place 1 drop into both eyes 4 (four) times daily as needed (dry eyes)., Disp: , Rfl:  .  polyethylene glycol (MIRALAX / GLYCOLAX) 17 g packet, Take 17 g by mouth daily., Disp: , Rfl:  .  risperidone (RISPERDAL) 4 MG tablet, TAKE ONE TABLET BY MOUTH EVERY NIGHT AT BEDTIME, Disp: 60 tablet, Rfl: 3 .  traZODone (DESYREL) 50 MG tablet, TAKE 1-2 TABLETS BY MOUTH AT BEDTIME AS NEEDED (Patient taking differently: Take  50 mg by mouth at bedtime. ), Disp: 180 tablet, Rfl: 0  Social History   Tobacco Use  Smoking Status Former Smoker  . Packs/day: 0.10  . Types: Cigarettes  . Quit date: 05/29/2019  . Years since quitting: 1.6  Smokeless Tobacco Never Used  Tobacco Comment   only occasionally smokes    Allergies  Allergen Reactions  . Codeine Other (See Comments)    Agitation/mean   . 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 There is no height or weight on file to calculate BMI. Constitutional Well developed. Well nourished.  Vascular Dorsalis pedis pulses palpable bilaterally. Posterior tibial pulses palpable bilaterally. Capillary refill normal to all digits.  No cyanosis or clubbing  noted. Pedal hair growth normal.  Neurologic Normal speech. Oriented to person, place, and time. Epicritic sensation to light touch grossly present bilaterally.  Dermatologic  grade 1 pressure injury noted without ulceration to the left heel.  Pain on palpation to the left heel.  No erythema noted.  Mild ecchymosis noted.  Orthopedic: Normal joint ROM without pain or crepitus bilaterally. No visible deformities. No bony tenderness.   Radiographs: None Assessment:   1. Pressure injury of left heel, stage 1    Plan:  Patient was evaluated and treated and all questions answered.  Left heel stage I pressure injury to the heel -I explained the patient the etiology of pressure injury worse treatment options were discussed.  The likely culprit for it is not offloading aggressively when sleeping at night leading to 8 hours of pressure injury however I discussed with her to continue wearing the Multi-Podus boots and to put pillow behind the leg to completely offload it.  Patient does have a new custom shoes with custom insoles that could also be a problem as well.  I encouraged him to go back into the cam boot for now until this is completely healed.  They state understanding. -Place his foot back in the cam boot. -No dressings indicated at this time  No follow-ups on file.

## 2021-01-08 ENCOUNTER — Encounter: Payer: Self-pay | Admitting: Neurology

## 2021-01-08 ENCOUNTER — Ambulatory Visit: Payer: Medicare Other | Admitting: Neurology

## 2021-01-08 ENCOUNTER — Other Ambulatory Visit: Payer: Self-pay | Admitting: Neurology

## 2021-01-08 VITALS — BP 122/81 | HR 85 | Ht 72.0 in | Wt 159.5 lb

## 2021-01-08 DIAGNOSIS — K117 Disturbances of salivary secretion: Secondary | ICD-10-CM | POA: Diagnosis not present

## 2021-01-08 MED ORDER — ONABOTULINUMTOXINA 100 UNITS IJ SOLR
100.0000 [IU] | Freq: Once | INTRAMUSCULAR | Status: AC
  Administered 2021-01-08: 100 [IU] via INTRAMUSCULAR

## 2021-01-08 NOTE — Progress Notes (Signed)
**  Botox 100 units x 1 vial, NDC 8864-8472-07, Lot [id number], Exp 03/2023, office supply.//mck,rn**

## 2021-01-08 NOTE — Progress Notes (Signed)
PATIENT: [name] DOB: 16-Aug-1961  REASON FOR VISIT: follow up HISTORY FROM: patient  HISTORY OF PRESENT ILLNESS: Today 01/08/21  HISTORY  HISTORY OF PRESENT ILLNESS: He has PMHx of HTN, hypothyroidism, on supplement, bipolar disorder, taking risperidone 4 mg daily for more than ten years, lithium 367m tid, report level was within normal limit, he is also taking alprazolam 158mtid. His psychiatrist is Dr. CuCandis SchatzHe had a lot of stress recently, his son died after struck by a truck in August 2013, his wife suffered stroke in August 2011.  He complains of bilateral hand tremor over the past few years, getting worse since 2013, fairly symmtric, if he holds anything more than 5 Lbs, for more than 10 minutes, he has right arm and hand shaking, he spills water when holding a glass of water. He denies significant gait difficulty, denies loss sense of smell. He denies family history of tremor.  Lab in July 25th 2014, showed normal CBC.   MRI of the brain done 07/19/2013 without acute findings. His tremor gets worse if he is anxious.  UPDATE Sep 9th 2015:He has gradually declining functional status, he is no longer working, sit at home most of the time, is no longer driving, he was recently evaluated by his psychiatrist Dr. CuCandis Schatzn June 2015, Lithium was decreased from 90044maily to 600 every day, he is also taking Risperdal 4 mg every day He has worsening memory trouble, constipation, restless he also reported worsening depression, to the point of suicidal thought sometimes   UPDATE Oct 26 2014: Sinemet 25/100 3 times a day since Sep 2015, has been very helpful, his tremor has much improved, he continue has mild unsteady gait, recently complains of bilateral heel pain, thick skin at left medial heel.He is still taking Risperdal 4 mg every night, Xanax 1 mg half to one tablet as needed, lithium 300 mg 2 tablets every night, Synthroid 75 g once every day  UPDATE Oct 15 2015: His tremor is better with sinemet, He complains of right foot pain, planning on to have surgery soon. Today his wife is also concerned about his slow worsening gait difficulty, unsteady gait, difficulty to clear his feet from the floor, worsening bilateral upper extremity weakness, bilateral deltoid muscle wasting   UPDATE January 28 2016: EMG nerve conduction study in October 30 2015 was normal, there was no evidence of large fiber peripheral neuropathy, right lumbar or right cervical radiculopathy. He is planning on to have right foot surgery for benign mass in January 30 2016  He is still on Sinemet 25/100 mg 3 times a day, which does help his tremor  UPDATE Jul 30 2016: Patient reported increased tremor, bradykinesia, memory loss since his lithium was increased from 600 mg to 900 mg every night per family this is based on laboratory evaluation, there is no significant change on patient mood,  In addition he is also taking Risperdal 4 mg once every night, trazodone 50 mg every night, thyroid supplement. He is very frustrated about his slow movement, memory loss, bilateral hands tremor, cannot even clip his fingernails  He is under the care of [facility] PA TerDonnal Moatone ([phone] April 29 2017: He is with his wife, he has worsening memory loss, "just sit there", Watch TV, he needs help dressing, bathing, wear depends. He drinks 3 boost a day, lack of appetite, he sleeps well.  He is now back to lithium 600m33mily, He is so flat.  His wife is going to retire April 30 2017 to take care of him.  This is a sudden change compared to 01-22-12, he was a Librarian, academic of a warehouse, this a sudden change since his son died of accident in 01/22/12. He had diagnosis of bipolar disorder since January 22, 2008,   We have reviewed repeat CT chest without contrast on June 12th 2018: Stable right lung base subpleural mass,  I was able to talk with his psychiatrist PA Donnal Moat,, updated on the information, and also concerned about medication side effect  UPDATE Sept 11 2018:  He is accompanied by his wife at today's clinical visit, wife is apparently very frustrated about his flat affect, lack of motivations,he needs more help inhis daily activity, personal care  He is on lower dose of risperidone 97m qhs, trazodone 573mqhs, Lithium 60042mhs, he continue to take Sinemet 25/100 mg 3 times a day, which has helped his hand tremor.  I personally reviewed MRI of the brain on May 09 2017, there is generalized atrophy mild supratentorial small vessel disease Extensive laboratory evaluation showed normal ESR, CPK, CBC showed mild elevated WBC 11 point 9, vitamin D was decreased 18 point 7, CMP showed mild elevated glucose 107  Update May 23, 2020: Patient was diagnosed with secondary Parkinson's disease, due to his long-term neurotrophic medication treatment, he was started on Sinemet 25/100 mg 3 times daily since 2012018/03/08hich did help his symptoms,  Personally reviewed MRI of the brain most recent was in June 2018, generalized atrophy, supratentorium small vessel disease,  His Sinemet was increased from 1-1 and half 3 times a day due to his reported increased gait abnormality, drooling,   Higher dose of Sinemet, did help his movement, but he is dealing with nonhealing left foot ulcer, has peripheral vascular disease, has pending procedure  Wife also reported that he has difficulty turning his body on bed, has to wake her up to help him using bathroom, he also had excessive drooling for more than 1 year, drooling into his plate, on his shirt, on the floor  UPDATE July 15 2020: This is his first Botox injection for hypersialorrhea, potential mechanical symptoms side effect explained, he tolerated the procedure well, used Botox a 100 units  Following last visit in July 2021, because of his complaints of difficulty turning at bedtime, restarted Sinemet CR  25/100 mg at bedtime, wife noticed that he continue have trouble sleeping, turning, also had excessive daytime sleepiness, there was no significant improvement adding on Sinemet CR, will stop it today, continue immediate release Sinemet 25/100 mg 1 and half tablets 3 times a day at 9 a.m., 1 PM, 5 PM  REVIEW OF SYSTEMS: Out of a complete 14 system review of symptoms, the patient complains only of the following symptoms, and all other reviewed systems are negative.  Gait instability, falls  ALLERGIES: Allergies  Allergen Reactions  . Codeine Other (See Comments)    Agitation/mean   . Lamisil [Terbinafine Hcl]     Lost taste buds for 2 years    HOME MEDICATIONS: Outpatient Medications Prior to Visit  Medication Sig Dispense Refill  . ALPRAZolam (XANAX) 1 MG tablet TAKE ONE TABLET BY MOUTH THREE TIMES A DAY AS NEEDED FOR ANXIETY 90 tablet 5  . amLODipine (NORVASC) 5 MG tablet Take 5 mg by mouth daily.    . aMarland Kitchenpirin 81 MG EC tablet Take 81 mg by mouth daily.     . aMarland Kitchenorvastatin (LIPITOR) 20 MG tablet Take 20  mg by mouth at bedtime.    . botulinum toxin Type A (BOTOX) 100 units SOLR injection Inject 100 Units into the muscle every 3 (three) months. 1 each 3  . carbidopa-levodopa (SINEMET IR) 25-100 MG tablet TAKE 1 AND 1/2 TABLET BY MOUTH THREE TIMES A DAY 400 tablet 1  . Cholecalciferol (VITAMIN D3) 1000 units CAPS Take 1,000 Units by mouth daily.    Marland Kitchen gabapentin (NEURONTIN) 100 MG capsule Take 100 mg by mouth at bedtime.     Marland Kitchen levothyroxine (SYNTHROID) 75 MCG tablet TAKE ONE TABLET BY MOUTH EVERY MORNING 90 tablet 0  . lithium carbonate 300 MG capsule Take 2 capsules (600 mg total) by mouth at bedtime. 60 capsule 5  . Polyethyl Glycol-Propyl Glycol (SYSTANE OP) Place 1 drop into both eyes 4 (four) times daily as needed (dry eyes).    . polyethylene glycol (MIRALAX / GLYCOLAX) 17 g packet Take 17 g by mouth daily.    . risperidone (RISPERDAL) 4 MG tablet TAKE ONE TABLET BY MOUTH EVERY NIGHT  AT BEDTIME 60 tablet 3  . traZODone (DESYREL) 50 MG tablet TAKE 1-2 TABLETS BY MOUTH AT BEDTIME AS NEEDED (Patient taking differently: Take 50 mg by mouth at bedtime.) 180 tablet 0  . Carbidopa-Levodopa ER (SINEMET CR) 25-100 MG tablet controlled release Take 1 tablet by mouth at bedtime. 30 tablet 11  . collagenase (SANTYL) ointment Apply 1 application topically daily. 30 g 0  . collagenase (SANTYL) ointment Apply 1 application topically daily. 15 g 0  . doxycycline (VIBRA-TABS) 100 MG tablet Take 1 tablet (100 mg total) by mouth 2 (two) times daily. 30 tablet 1  . doxycycline (VIBRA-TABS) 100 MG tablet Take 1 tablet (100 mg total) by mouth 2 (two) times daily. 20 tablet 0   No facility-administered medications prior to visit.    PAST MEDICAL HISTORY: Past Medical History:  Diagnosis Date  . Anxiety   . Bipolar 1 disorder ([place])   . Depression   . High blood pressure   . High cholesterol   . Parkinson's disease ([place])   . Peripheral neuropathy   . Thyroid disease   . Tremor     PAST SURGICAL HISTORY: Past Surgical History:  Procedure Laterality Date  . HEMORRHOID SURGERY     1980's  . IR ANGIOGRAM EXTREMITY LEFT  05/28/2020  . IR RADIOLOGIST EVAL & MGMT  03/14/2020  . IR US GUIDE VASC ACCESS LEFT  05/28/2020  . MULTIPLE TOOTH EXTRACTIONS     2016 Has top plate    FAMILY HISTORY: Family History  Problem Relation Age of Onset  . Heart attack Mother   . Liver disease Father     SOCIAL HISTORY: Social History   Socioeconomic History  . Marital status: Married    Spouse name: [name]  . Number of children: 1  . Years of education: 23  . Highest education level: Not on file  Occupational History    Comment: Not working  Tobacco Use  . Smoking status: Former Smoker    Packs/day: 0.10    Types: Cigarettes    Quit date: 05/29/2019    Years since quitting: 1.6  . Smokeless tobacco: Never Used  . Tobacco comment: only occasionally smokes  Vaping Use  . Vaping Use: Never  used  Substance and Sexual Activity  . Alcohol use: No  . Drug use: No    Comment: Quit 2009  . Sexual activity: Not on file  Other Topics Concern  . Not on file  Social History Narrative   Patient lives at home with with his wife [name]). Patient is not working at this time. Patient has a 12th grade education.    Caffeine - four mountain dews daily.   Right handed.   Patient has one child.   Social Determinants of Health   Financial Resource Strain: Not on file  Food Insecurity: Not on file  Transportation Needs: Not on file  Physical Activity: Not on file  Stress: Not on file  Social Connections: Not on file  Intimate Partner Violence: Not on file    PHYSICAL EXAM  Vitals:   01/08/21 1508  BP: 122/81  Pulse: 85  Weight: 159 lb 8 oz (72.3 kg)  Height: 6' (1.829 m)   Body mass index is 21.63 kg/m.  Generalized: Well developed, in no acute distress  MMSE - Mini Mental State Exam 11/22/2019 11/02/2018 07/27/2017  Not completed: - (No Data) -  Orientation to time _0 Orientation to Place _1 Registration _2 Attention/ Calculation _3 Recall _4 Language- name 2 objects _5 Language- repeat _6 Language- follow 3 step command _7 Language- follow 3 step command-comments - took the paper in his left hand -  Language- read & follow direction _8 Write a sentence _9 Copy design _10 Total score _11 PHYSICAL EXAMNIATION:  Decreased facial expression, mild soft voice  ASSESSMENT AND PLAN 60 y.o. year old male   Secondary parkinsonism  Fairly symmetric, history of long-term psychiatric medication treatment, including Risperdal, lithium,  Was treated with Sinemet 25/100 mg since 2018, did report improvement, more gait improvement with higher dose of 1.5 mg 3 times a day, taking it at 9 AM, 1 PM, 5 PM,  There was no significant change after add on Sinemet CR 25/100 mg every night, wife noticed excessive sleepiness during  the daytime, will stop Sinemet CR today  Gait abnormality  Multiple factorial, parkinsonian features, left foot pain, wearing left boot  Peripheral vascular disease  He has vascular risk factor of sedentary lifestyle, hyperlipidemia, encourage increased water intake, aspirin 81 mg daily,  May consider refer him to physical therapy after his foot ulcer healed  Memory loss  Stable  Excessive drooling  Botox a 100 mg for hypersialorrhea, 40 units to each parotid gland, divided into 3 injection sites, 10 units to each submandibular gland   He will return to clinic in 3 months for repeat injection     Marcial Pacas, M.D. Ph.D.  [facility] [place], [place] [address] Phone: [phone] Fax:      [phone]

## 2021-01-09 ENCOUNTER — Encounter: Payer: Self-pay | Admitting: Physician Assistant

## 2021-01-09 ENCOUNTER — Other Ambulatory Visit: Payer: Self-pay

## 2021-01-09 ENCOUNTER — Ambulatory Visit (INDEPENDENT_AMBULATORY_CARE_PROVIDER_SITE_OTHER): Payer: Medicare Other | Admitting: Physician Assistant

## 2021-01-09 DIAGNOSIS — R400 Somnolence: Secondary | ICD-10-CM | POA: Diagnosis not present

## 2021-01-09 DIAGNOSIS — F319 Bipolar disorder, unspecified: Secondary | ICD-10-CM | POA: Diagnosis not present

## 2021-01-09 DIAGNOSIS — F411 Generalized anxiety disorder: Secondary | ICD-10-CM | POA: Diagnosis not present

## 2021-01-09 DIAGNOSIS — G2119 Other drug induced secondary parkinsonism: Secondary | ICD-10-CM

## 2021-01-09 DIAGNOSIS — Z79899 Other long term (current) drug therapy: Secondary | ICD-10-CM

## 2021-01-09 NOTE — Progress Notes (Signed)
[facility]  Patient ID: [name],  MRN: [id number]  PCP: Velna Hatchet, MD  Date of Evaluation: 01/09/2021 Time spent:40 minutes  Chief Complaint:  Chief Complaint    Anxiety; Depression      HISTORY/CURRENT STATUS: HPI For routine med check. Wife [name] is with him.  Overall he and his wife say he is doing fairly well mentally.  Since our last visit, his mother-in-law passed away.  He and [name] will likely have to move after things are settled with the estate.  He does not want to change anything and certainly does not want to move.  States he reminds him it may be a good thing, maybe they can get a walk-in shower so he will not have to worry so much about falling, getting in and out of the tub. Parkinsons Disease isn't worse really, but having difficulty with ADLs.  He still has a hard time enjoying anything.  All he does is watch TV and states he reports him falling asleep at the drop of a hat.  She is wondering if he really needs the trazodone for sleep.  He has no trouble falling asleep at night.  He is very lethargic most all the time now.  Has little to no energy or motivation.  But sadly due to PD there is not much he can do.  He never wants to go anywhere.  [name] only goes out once a week to the grocery store or to get her haircut occasionally.  No suicidal or homicidal thoughts are reported.  Patient denies increased energy with decreased need for sleep, no increased talkativeness, no racing thoughts, no impulsivity or risky behaviors, no increased spending, no increased libido, no grandiosity, no increased irritability or anger, and no hallucinations.  Individual Medical History/ Review of Systems: Changes? :Yes  Has pressure sore, left foot, in a boot.  This is been going on for over a year.   Getting Botox for drooling d/t PD.  Past medications for mental health diagnoses include: Lithium, Risperdal, Xanax, Synthroid, Latuda, trazodone, Depakote,  Lamictal  Allergies: Codeine and Lamisil [terbinafine hcl]  Current Medications:  Current Outpatient Medications:  .  ALPRAZolam (XANAX) 1 MG tablet, TAKE ONE TABLET BY MOUTH THREE TIMES A DAY AS NEEDED FOR ANXIETY, Disp: 90 tablet, Rfl: 5 .  amLODipine (NORVASC) 5 MG tablet, Take 5 mg by mouth daily., Disp: , Rfl:  .  aspirin 81 MG EC tablet, Take 81 mg by mouth daily. , Disp: , Rfl:  .  atorvastatin (LIPITOR) 20 MG tablet, Take 20 mg by mouth at bedtime., Disp: , Rfl:  .  botulinum toxin Type A (BOTOX) 100 units SOLR injection, Inject 100 Units into the muscle every 3 (three) months., Disp: 1 each, Rfl: 3 .  carbidopa-levodopa (SINEMET IR) 25-100 MG tablet, TAKE 1 AND 1/2 TABLET BY MOUTH THREE TIMES A DAY, Disp: 400 tablet, Rfl: 1 .  Cholecalciferol (VITAMIN D3) 1000 units CAPS, Take 1,000 Units by mouth daily., Disp: , Rfl:  .  gabapentin (NEURONTIN) 100 MG capsule, Take 100 mg by mouth at bedtime. , Disp: , Rfl:  .  levothyroxine (SYNTHROID) 75 MCG tablet, TAKE ONE TABLET BY MOUTH EVERY MORNING, Disp: 90 tablet, Rfl: 0 .  lithium carbonate 300 MG capsule, Take 2 capsules (600 mg total) by mouth at bedtime., Disp: 60 capsule, Rfl: 5 .  Polyethyl Glycol-Propyl Glycol (SYSTANE OP), Place 1 drop into both eyes 4 (four) times daily as needed (dry eyes)., Disp: , Rfl:  .  polyethylene glycol (MIRALAX / GLYCOLAX) 17 g packet, Take 17 g by mouth daily., Disp: , Rfl:  .  risperidone (RISPERDAL) 4 MG tablet, TAKE ONE TABLET BY MOUTH EVERY NIGHT AT BEDTIME, Disp: 60 tablet, Rfl: 3 .  traZODone (DESYREL) 50 MG tablet, TAKE 1-2 TABLETS BY MOUTH AT BEDTIME AS NEEDED (Patient taking differently: Take 50 mg by mouth at bedtime.), Disp: 180 tablet, Rfl: 0 Medication Side Effects: hypersomnolence  Family Medical/ Social History: Changes? No  MENTAL HEALTH EXAM:  There were no vitals taken for this visit.There is no height or weight on file to calculate BMI.  General Appearance: Casual, Neat and Well  Groomed  Eye Contact:  Good  Speech:  Clear and Coherent, Slow and Slurred due to PD  Volume:  Normal  Mood:  Euthymic  Affect:  Flat but he does laugh some  Thought Process:  Goal Directed and Descriptions of Associations: Intact  Orientation:  Full (Time, Place, and Person)  Thought Content: Logical   Suicidal Thoughts:  No  Homicidal Thoughts:  No  Memory:  WNL  Judgement:  Good  Insight:  Good  Psychomotor Activity:  Decreased, Shuffling Gait and has walking boot on left foot  Concentration:  Concentration: Good  Recall:  Good  Fund of Knowledge: Good  Language: Good  Assets:  Desire for Improvement  ADL's:  Impaired  Cognition: WNL  Prognosis:  Good   10/08/2020 Lipids T Chol 143, Trig 113, HDL 37, LDL 83 CMP Glucose 99, BUN/Cr 13/1.0, LFTs normal CBC nl TSH 2.45 Lithium level no recent   DIAGNOSES:    ICD-10-CM   1. Bipolar I disorder ([place])  F31.9   2. Generalized anxiety disorder  F41.1   3. Encounter for long-term (current) use of medications  Z79.899 Lithium level  4. Somnolence  R40.0   5. 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 I provided 40 minutes face to face time during this encounter, including reviewing notes from recent providers.,  Also spent reviewing labs.  We also discussed increased somnolence.  We will decrease trazodone to see if that makes him more alert and less sleepy all throughout the day.  We briefly discussed adding modafinil or armodafinil but I prefer not add another medication to his mix and would want to discuss this with his neurologist before starting it. [name] can call in a few weeks if he is not better and will try it.  Unfortunately the Parkinsons is part of the reason for this.  Continue Xanax 1 mg, 1/2-1 3 times daily as needed. Continue gabapentin 100 mg 1 nightly. Continue lithium 300 mg, 2 p.o. nightly. Continue Synthroid 75 mcg q  am. Continue Risperdal 4 mg nightly. Decrease trazodone 50 mg, to 1/2 pill nightly as needed.  He may not even need that.  [name] will observe. Lithium level to be drawn within the next few weeks. Return in 6 months.  Donnal Moat, PA-C

## 2021-01-16 LAB — LITHIUM LEVEL: Lithium Lvl: 0.5 mmol/L (ref 0.5–1.2)

## 2021-01-16 NOTE — Progress Notes (Signed)
[name] has been informed.

## 2021-01-16 NOTE — Progress Notes (Signed)
Please call his wife [name] and let her know Lithium level is 0.5, which is good.  No change in dose of Lithium. Thanks.

## 2021-01-31 ENCOUNTER — Ambulatory Visit: Payer: Medicare Other | Admitting: Podiatry

## 2021-02-07 ENCOUNTER — Ambulatory Visit: Payer: Medicare Other | Admitting: Podiatry

## 2021-02-07 ENCOUNTER — Other Ambulatory Visit: Payer: Self-pay

## 2021-02-07 DIAGNOSIS — L89621 Pressure ulcer of left heel, stage 1: Secondary | ICD-10-CM | POA: Diagnosis not present

## 2021-02-12 ENCOUNTER — Encounter: Payer: Self-pay | Admitting: Podiatry

## 2021-02-12 NOTE — Progress Notes (Signed)
Subjective:  Patient ID: [name], male    DOB: March 13, 1961,  MRN: [id number]  Chief Complaint  Patient presents with  . Foot Ulcer    PT stated that he is doing well the pain comes and goes     60 y.o. male presents with the above complaint.  Patient presents with follow-up of left heel pressure injury.  He states that he is doing a lot better.  He has been ambulating with a cam boot.  He thinks it may have healed.   Review of Systems: Negative except as noted in the HPI. Denies N/V/F/Ch.  Past Medical History:  Diagnosis Date  . Anxiety   . Bipolar 1 disorder ([place])   . Depression   . High blood pressure   . High cholesterol   . Parkinson's disease ([place])   . Peripheral neuropathy   . Thyroid disease   . Tremor     Current Outpatient Medications:  .  ALPRAZolam (XANAX) 1 MG tablet, TAKE ONE TABLET BY MOUTH THREE TIMES A DAY AS NEEDED FOR ANXIETY, Disp: 90 tablet, Rfl: 5 .  amLODipine (NORVASC) 5 MG tablet, Take 5 mg by mouth daily., Disp: , Rfl:  .  aspirin 81 MG EC tablet, Take 81 mg by mouth daily. , Disp: , Rfl:  .  atorvastatin (LIPITOR) 20 MG tablet, Take 20 mg by mouth at bedtime., Disp: , Rfl:  .  botulinum toxin Type A (BOTOX) 100 units SOLR injection, Inject 100 Units into the muscle every 3 (three) months., Disp: 1 each, Rfl: 3 .  carbidopa-levodopa (SINEMET IR) 25-100 MG tablet, TAKE 1 AND 1/2 TABLET BY MOUTH THREE TIMES A DAY, Disp: 400 tablet, Rfl: 1 .  Cholecalciferol (VITAMIN D3) 1000 units CAPS, Take 1,000 Units by mouth daily., Disp: , Rfl:  .  gabapentin (NEURONTIN) 100 MG capsule, Take 100 mg by mouth at bedtime. , Disp: , Rfl:  .  levothyroxine (SYNTHROID) 75 MCG tablet, TAKE ONE TABLET BY MOUTH EVERY MORNING, Disp: 90 tablet, Rfl: 0 .  lithium carbonate 300 MG capsule, Take 2 capsules (600 mg total) by mouth at bedtime., Disp: 60 capsule, Rfl: 5 .  Polyethyl Glycol-Propyl Glycol (SYSTANE OP), Place 1 drop into both eyes 4 (four) times daily as needed  (dry eyes)., Disp: , Rfl:  .  polyethylene glycol (MIRALAX / GLYCOLAX) 17 g packet, Take 17 g by mouth daily., Disp: , Rfl:  .  risperidone (RISPERDAL) 4 MG tablet, TAKE ONE TABLET BY MOUTH EVERY NIGHT AT BEDTIME, Disp: 60 tablet, Rfl: 3 .  traZODone (DESYREL) 50 MG tablet, TAKE 1-2 TABLETS BY MOUTH AT BEDTIME AS NEEDED (Patient taking differently: Take 50 mg by mouth at bedtime.), Disp: 180 tablet, Rfl: 0  Social History   Tobacco Use  Smoking Status Former Smoker  . Packs/day: 0.10  . Types: Cigarettes  . Quit date: 05/29/2019  . Years since quitting: 1.7  Smokeless Tobacco Never Used  Tobacco Comment   only occasionally smokes    Allergies  Allergen Reactions  . Codeine Other (See Comments)    Agitation/mean   . 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 There is no height or weight on file to calculate BMI. Constitutional Well developed. Well nourished.  Vascular Dorsalis pedis pulses palpable bilaterally. Posterior tibial pulses palpable bilaterally. Capillary refill normal to all digits.  No cyanosis or clubbing noted. Pedal hair growth normal.  Neurologic Normal speech. Oriented to person, place, and time. Epicritic sensation to light touch grossly present bilaterally.  Dermatologic  pressure injury completely reepithelialized.  No erythema noted.  No signs of ulceration noted..  Orthopedic: Normal joint ROM without pain or crepitus bilaterally. No visible deformities. No bony tenderness.   Radiographs: None Assessment:   1. Pressure injury of left heel, stage 1    Plan:  Patient was evaluated and treated and all questions answered.  Left heel stage I pressure injury to the heel -Clinically healed.  Patient can start transition back into regular sneakers.  I did mention to the patient and his wife to continue monitoring to see if there is a recurrence of skin breakdown.  At this time the  skin has completely reepithelialized.  If any foot and ankle issues come back and see me right away.  No follow-ups on file.

## 2021-02-24 ENCOUNTER — Other Ambulatory Visit: Payer: Self-pay | Admitting: Physician Assistant

## 2021-04-07 ENCOUNTER — Telehealth: Payer: Self-pay | Admitting: Podiatry

## 2021-04-07 NOTE — Telephone Encounter (Signed)
pts wife called about receiving a bill for the 438.00 for the orthotics and that she talked with the insurance and they told her they were covered at 80% and they would have the 20% coinsurance of 87 and some change. Pts wife stated she paid that today. She said the insurance company needed a letter of medical necessity and the RX.   Upon reviewing the chart when [name] saw the pt he told the pt the cost would be 200.00(admin fee). So I then called pts wife back and she stated she remembered talking about that and has agreed to pay the 200.00. I told her we would take out the 438.00 charge and the 87 she paid today should be applied to that and then she would get a bill for the rest. She was satisfied with this and said thank you for helping get it straight.

## 2021-04-09 ENCOUNTER — Ambulatory Visit: Payer: Medicare Other | Admitting: Neurology

## 2021-04-09 ENCOUNTER — Encounter: Payer: Self-pay | Admitting: Neurology

## 2021-04-09 VITALS — BP 113/81 | HR 92 | Ht 72.0 in | Wt 162.0 lb

## 2021-04-09 DIAGNOSIS — R269 Unspecified abnormalities of gait and mobility: Secondary | ICD-10-CM | POA: Insufficient documentation

## 2021-04-09 DIAGNOSIS — R531 Weakness: Secondary | ICD-10-CM | POA: Insufficient documentation

## 2021-04-09 DIAGNOSIS — K117 Disturbances of salivary secretion: Secondary | ICD-10-CM | POA: Diagnosis not present

## 2021-04-09 DIAGNOSIS — I739 Peripheral vascular disease, unspecified: Secondary | ICD-10-CM | POA: Diagnosis not present

## 2021-04-09 MED ORDER — GLYCOPYRROLATE 1 MG/5ML PO SOLN: 2.5000 mL | Freq: Two times a day (BID) | ORAL | 6 refills | Status: DC | PRN

## 2021-04-09 NOTE — Progress Notes (Signed)
ASSESSMENT AND PLAN 60 y.o. year old male   Secondary parkinsonism  Fairly symmetric, history of long-term psychiatric medication treatment, including Risperdal, lithium,  Was treated with Sinemet 25/100 mg since 2018, did report improvement, more gait improvement with higher dose of 1.5 mg 3 times a day, taking it at 9 AM, 1 PM, 7 PM,  There was no significant change after add on Sinemet CR 25/100 mg every night, wife noticed excessive sleepiness during the daytime, will   Sinemet CR has stopped  New onset left facial and arm weakness worsening gait abnormality  Previous gait evaluation was limited by his non-healing left heel ulcer, on today's examination, he was noted to have left lower face weakness, mild left upper extremity weakness, known history of peripheral vascular disease, vascular risk factor of sedentary, hyperlipidemia, hypertension, hypothyroidism,  MRI of the brain to rule out right hemisphere stroke  Is on aspirin 81 mg daily, also encourage moderate exercise, increase water intake  Excessive drooling  Related to his parkinsonian features, lack of motor coordination, no significant response with 3 trials of Botox injection from August 21 to February 2022, will stop Botox injection, give him a call however late trial of glycopyrrolate liquid form, low-dose, 0.5 mg twice daily as needed  Data Reviewed:   MRI of the brain without contrast on May 09, 2017: 1.  Some scattered T2/FLAIR hyperintense foci in the subcortical deep white matter of the hemispheres consistent with mild age-related chronic microvascular ischemic change. 2.  There are no acute findings. 3.  Brain volume is normal for age.  May 28, 2020, ultrasound-guided left lower extremity angiogram No treatable tibial disease was discovered on the angiogram, with small vessel disease confirmed in the left foot.  HISTORY OF PRESENT ILLNESS: He has PMHx of HTN, hypothyroidism, on supplement, bipolar disorder,  taking risperidone 4 mg daily for more than ten years, lithium 366m tid, report level was within normal limit, he is also taking alprazolam 170mtid. His psychiatrist is Dr. CuCandis SchatzHe had a lot of stress recently, his son died after struck by a truck in August 2013, his wife suffered stroke in August 2011.  He complains of bilateral hand tremor over the past few years, getting worse since 2013, fairly symmtric, if he holds anything more than 5 Lbs, for more than 10 minutes, he has right arm and hand shaking, he spills water when holding a glass of water. He denies significant gait difficulty, denies loss sense of smell. He denies family history of tremor.  Lab in July 25th 2014, showed normal CBC.   MRI of the brain done 07/19/2013 without acute findings. His tremor gets worse if he is anxious.  UPDATE Sep 9th 2015:He has gradually declining functional status, he is no longer working, sit at home most of the time, is no longer driving, he was recently evaluated by his psychiatrist Dr. CuCandis Schatzn June 2015, Lithium was decreased from 90089maily to 600 every day, he is also taking Risperdal 4 mg every day He has worsening memory trouble, constipation, restless he also reported worsening depression, to the point of suicidal thought sometimes   UPDATE Oct 26 2014: Sinemet 25/100 3 times a day since Sep 2015, has been very helpful, his tremor has much improved, he continue has mild unsteady gait, recently complains of bilateral heel pain, thick skin at left medial heel.He is still taking Risperdal 4 mg every night, Xanax 1 mg half to one tablet as needed, lithium 300 mg 2  tablets every night, Synthroid 75 g once every day  UPDATE Oct 15 2015: His tremor is better with sinemet, He complains of right foot pain, planning on to have surgery soon. Today his wife is also concerned about his slow worsening gait difficulty, unsteady gait, difficulty to clear his feet from the floor, worsening  bilateral upper extremity weakness, bilateral deltoid muscle wasting   UPDATE January 28 2016: EMG nerve conduction study in October 30 2015 was normal, there was no evidence of large fiber peripheral neuropathy, right lumbar or right cervical radiculopathy. He is planning on to have right foot surgery for benign mass in January 30 2016  He is still on Sinemet 25/100 mg 3 times a day, which does help his tremor  UPDATE Jul 30 2016: Patient reported increased tremor, bradykinesia, memory loss since his lithium was increased from 600 mg to 900 mg every night per family this is based on laboratory evaluation, there is no significant change on patient mood,  In addition he is also taking Risperdal 4 mg once every night, trazodone 50 mg every night, thyroid supplement. He is very frustrated about his slow movement, memory loss, bilateral hands tremor, cannot even clip his fingernails  [facility] is [facility] of [facility] PA Donnal Moat Phone [phone]  UPDATE April 29 2017: He is with his wife, he has worsening memory loss, "just sit there", Watch TV, he needs help dressing, bathing, wear depends. He drinks 3 boost a day, lack of appetite, he sleeps well.  He is now back to lithium 61m daily, He is so flat. His wife is going to retire April 30 2017 to take care of him.  This is a sudden change compared to 203-29-2013 he was a sLibrarian, academicof a warehouse, this a sudden change since his son died of accident in 22013-03-29 He had diagnosis of bipolar disorder since 203/29/09   We have reviewed repeat CT chest without contrast on June 12th 2018: Stable right lung base subpleural mass,  I was able to talk with his psychiatrist PA TDonnal Moat, updated on the information, and also concerned about medication side effect  UPDATE Sept 11 2018:  He is accompanied by his wife at today's clinical visit, wife is apparently very frustrated about his flat affect, lack of motivations,he needs more  help inhis daily activity, personal care  He is on lower dose of risperidone 352mqhs, trazodone 5065mhs, Lithium 600m39ms, he continue to take Sinemet 25/100 mg 3 times a day, which has helped his hand tremor.  I personally reviewed MRI of the brain on May 09 2017, there is generalized atrophy mild supratentorial small vessel disease Extensive laboratory evaluation showed normal ESR, CPK, CBC showed mild elevated WBC 11 point 9, vitamin D was decreased 18 point 7, CMP showed mild elevated glucose 107  Update May 23, 2020: Patient was diagnosed with secondary Parkinson's disease, due to his long-term neurotrophic medication treatment, he was started on Sinemet 25/100 mg 3 times daily since 201803-29-18ich did help his symptoms,  Personally reviewed MRI of the brain most recent was in June 2018, generalized atrophy, supratentorium small vessel disease,  His Sinemet was increased from 1-1 and half 3 times a day due to his reported increased gait abnormality, drooling,   Higher dose of Sinemet, did help his movement, but he is dealing with nonhealing left foot ulcer, has peripheral vascular disease, has pending procedure  Wife also reported that he has difficulty turning his body on  bed, has to wake her up to help him using bathroom, he also had excessive drooling for more than 1 year, drooling into his plate, on his shirt, on the floor  UPDATE July 15 2020: This is his first Botox injection for hypersialorrhea, potential mechanical symptoms side effect explained, he tolerated the procedure well, used Botox a 100 units  Following last visit in July 2021, because of his complaints of difficulty turning at bedtime, restarted Sinemet CR 25/100 mg at bedtime, wife noticed that he continue have trouble sleeping, turning, also had excessive daytime sleepiness, there was no significant improvement adding on Sinemet CR, will stop it today, continue immediate release Sinemet 25/100 mg 1 and half tablets  3 times a day at 9 a.m., 1 PM, 5 PM  UPDATE Apr 09 2021: He is accompanied by his wife at today's clinical visit, reported stable good response to Sinemet 25/100 mg 1 and half tablets 3 times a day at night a.m., 1 PM, 7 PM, does has difficulty turning at bedtime, but overall sleeps well, his left heel wound has healed, is out of boot, can walk much better now  We have tried Botox injection for excessive sialorrhea 3 times, wife did not notice any significant improvement, most bothersome is during the day especially when he tries to eat, also wet his pillow at nighttime  REVIEW OF SYSTEMS: Out of a complete 14 system review of symptoms, the patient complains only of the following symptoms, and all other reviewed systems are negative.  Gait instability, falls  ALLERGIES: Allergies  Allergen Reactions  . Codeine Other (See Comments)    Agitation/mean   . Lamisil [Terbinafine Hcl]     Lost taste buds for 2 years    HOME MEDICATIONS: Outpatient Medications Prior to Visit  Medication Sig Dispense Refill  . ALPRAZolam (XANAX) 1 MG tablet TAKE ONE TABLET BY MOUTH THREE TIMES A DAY AS NEEDED FOR ANXIETY 90 tablet 5  . amLODipine (NORVASC) 5 MG tablet Take 5 mg by mouth daily.    Marland Kitchen aspirin 81 MG EC tablet Take 81 mg by mouth daily.     Marland Kitchen atorvastatin (LIPITOR) 20 MG tablet Take 20 mg by mouth at bedtime.    . botulinum toxin Type A (BOTOX) 100 units SOLR injection Inject 100 Units into the muscle every 3 (three) months. 1 each 3  . carbidopa-levodopa (SINEMET IR) 25-100 MG tablet TAKE 1 AND 1/2 TABLET BY MOUTH THREE TIMES A DAY 400 tablet 1  . Cholecalciferol (VITAMIN D3) 1000 units CAPS Take 1,000 Units by mouth daily.    Marland Kitchen gabapentin (NEURONTIN) 100 MG capsule Take 100 mg by mouth at bedtime.     Marland Kitchen levothyroxine (SYNTHROID) 75 MCG tablet TAKE ONE TABLET BY MOUTH EVERY MORNING 90 tablet 1  . lithium carbonate 300 MG capsule TAKE TWO CAPSULES BY MOUTH AT BEDTIME 180 capsule 1  . Polyethyl  Glycol-Propyl Glycol (SYSTANE OP) Place 1 drop into both eyes 4 (four) times daily as needed (dry eyes).    . polyethylene glycol (MIRALAX / GLYCOLAX) 17 g packet Take 17 g by mouth daily.    . risperidone (RISPERDAL) 4 MG tablet TAKE ONE TABLET BY MOUTH EVERY NIGHT AT BEDTIME 60 tablet 3  . traZODone (DESYREL) 50 MG tablet TAKE 1-2 TABLETS BY MOUTH AT BEDTIME AS NEEDED (Patient taking differently: Take 50 mg by mouth at bedtime.) 180 tablet 0   No facility-administered medications prior to visit.    PAST MEDICAL HISTORY: Past Medical History:  Diagnosis Date  . Anxiety   . Bipolar 1 disorder ([place])   . Depression   . High blood pressure   . High cholesterol   . Parkinson's disease ([place])   . Peripheral neuropathy   . Thyroid disease   . Tremor     PAST SURGICAL HISTORY: Past Surgical History:  Procedure Laterality Date  . HEMORRHOID SURGERY     1980's  . IR ANGIOGRAM EXTREMITY LEFT  05/28/2020  . IR RADIOLOGIST EVAL & MGMT  03/14/2020  . IR US GUIDE VASC ACCESS LEFT  05/28/2020  . MULTIPLE TOOTH EXTRACTIONS     2016 Has top plate    FAMILY HISTORY: Family History  Problem Relation Age of Onset  . Heart attack Mother   . Liver disease Father     SOCIAL HISTORY: Social History   Socioeconomic History  . Marital status: Married    Spouse name: [name]  . Number of children: 1  . Years of education: 35  . Highest education level: Not on file  Occupational History    Comment: Not working  Tobacco Use  . Smoking status: Former Smoker    Packs/day: 0.10    Types: Cigarettes    Quit date: 05/29/2019    Years since quitting: 1.8  . Smokeless tobacco: Never Used  . Tobacco comment: only occasionally smokes  Vaping Use  . Vaping Use: Never used  Substance and Sexual Activity  . Alcohol use: No  . Drug use: No    Comment: Quit 2009  . Sexual activity: Not on file  Other Topics Concern  . Not on file  Social History Narrative   Patient lives at home with with his  wife [name]). Patient is not working at this time. Patient has a 12th grade education.    Caffeine - four mountain dews daily.   Right handed.   Patient has one child.   Social Determinants of Health   Financial Resource Strain: Not on file  Food Insecurity: Not on file  Transportation Needs: Not on file  Physical Activity: Not on file  Stress: Not on file  Social Connections: Not on file  Intimate Partner Violence: Not on file    PHYSICAL EXAM  Vitals:   04/09/21 1002  BP: 113/81  Pulse: 92  Weight: 162 lb (73.5 kg)  Height: 6' (1.829 m)   Body mass index is 21.97 kg/m.  Generalized: Well developed, in no acute distress  MMSE - Mini Mental State Exam 11/22/2019 11/02/2018 07/27/2017  Not completed: - (No Data) -  Orientation to time '5 3 5  ' Orientation to Place '4 5 5  ' Registration '3 3 3  ' Attention/ Calculation '1 5 5  ' Recall '2 1 3  ' Language- name 2 objects '2 2 2  ' Language- repeat '1 1 1  ' Language- follow 3 step command '3 2 3  ' Language- follow 3 step command-comments - took the paper in his left hand -  Language- read & follow direction '1 1 1  ' Write a sentence '1 1 1  ' Copy design '1 1 1  ' Total score '24 25 30    ' PHYSICAL EXAMNIATION:  Gen: NAD, conversant, well nourised, well groomed                     Cardiovascular: Regular rate rhythm, no peripheral edema, warm, nontender. Eyes: Conjunctivae clear without exudates or hemorrhage Neck: Supple, no carotid bruits. Pulmonary: Clear to auscultation bilaterally   NEUROLOGICAL EXAM:  MENTAL STATUS: Speech/Cognition: Soft  wet, mild slurred speech, slow spastic tongue movement  CRANIAL NERVES: CN II: Visual fields are full to confrontation.  Pupils are round equal and briskly reactive to light. CN III, IV, VI: extraocular movement are normal. No ptosis. CN V: Facial sensation is intact to light touch. CN VII: Mild left shallow nasolabial folder, mild left lower face weakness CN VIII: Hearing is normal to casual  conversation CN IX, X: Palate elevates symmetrically. Phonation is normal. CN XI: Head turning and shoulder shrug are intact CN XII: Tongue is midline with normal movements and no atrophy.  Slow spastic tongue movement  MOTOR: Mild fixation of left arm upon rapid rotating movement, mild bilateral upper and lower extremity bradykinesia, rigidity, left worse than right,  REFLEXES: Hypoactive and symmetric  SENSORY: Intact to light touch  COORDINATION: There is no trunk or limb ataxia.    GAIT/STANCE: He needs push-up to get up from seated position, moderate stride, decreased bilateral arm swing   Memory loss  Stable  Excessive drooling  Botox a 100 mg for hypersialorrhea, 40 units to each parotid gland, divided into 3 injection sites, 10 units to each submandibular gland   He will return to clinic in 3 months for repeat injection     Marcial Pacas, M.D. Ph.D.  [facility] [place], [place] [address] Phone: [phone] Fax:      [phone]

## 2021-04-10 ENCOUNTER — Telehealth: Payer: Self-pay | Admitting: *Deleted

## 2021-04-10 NOTE — Telephone Encounter (Signed)
Glycopyrrolate PA, key: [id number], K11.7 drooling, failed Botox x 3 injections. Your information has been sent to [facility].

## 2021-04-10 NOTE — Telephone Encounter (Signed)
GLYCOPYRROLA SOL 1MG /5ML, use as directed, is approved through 11/15/2021 under your Medicare Part D benefit. approval faxed to pharmacy.

## 2021-04-12 ENCOUNTER — Other Ambulatory Visit: Payer: Self-pay

## 2021-04-12 ENCOUNTER — Ambulatory Visit
Admission: RE | Admit: 2021-04-12 | Discharge: 2021-04-12 | Disposition: A | Discharge status: Home / Self Care | Payer: Medicare Other | Source: Ambulatory Visit | Attending: Neurology | Admitting: Neurology

## 2021-04-12 DIAGNOSIS — I739 Peripheral vascular disease, unspecified: Secondary | ICD-10-CM

## 2021-04-12 DIAGNOSIS — R269 Unspecified abnormalities of gait and mobility: Secondary | ICD-10-CM

## 2021-04-12 DIAGNOSIS — R531 Weakness: Secondary | ICD-10-CM

## 2021-04-15 ENCOUNTER — Telehealth: Payer: Self-pay | Admitting: Neurology

## 2021-04-15 NOTE — Telephone Encounter (Signed)
  IMPRESSION:   This MRI of the brain without contrast shows the following:.     1.   No acute findings. 2.   Scattered punctate T2/FLAIR hyperintense foci in the subcortical and deep white matter consistent with mild chronic microvascular ischemic change, very minimally progressed compared to the 05/09/2017 MRI.  Please call patient, MRI of the brain showed mild small vessel disease,, evidence of mild progression compared to previous study in 2018, there was no acute abnormality, no evidence of stroke  He should continue aspirin 81 mg daily

## 2021-04-15 NOTE — Telephone Encounter (Signed)
I spoke to his wife, [name] (on [place]). She verbalized understanding of the MRI findings and will have him continue aspirin 81mg  daily.  She also reports an extremely dry mouth since starting the glycopyrrolate 1mg /21ml, 2.24ml BID. She is now only giving him one dose during the day. The drooling is still being controlled with the decreased amount and she hopes the mouth dryness will improve. She was instructed to call back if the problem persist.

## 2021-05-14 NOTE — Telephone Encounter (Signed)
Received a mychart message from the patient's wife. I called her to get more information. She has stopped the glycopyrrolate completely. He continued to have extreme dry mouth even after cutting the dose back. The dryness was causing blisters around his dentures. His blisters have now healed. She said the drooling is manageable and she will watch him closely for any choking risk. She wanted this update provided to Dr. Krista Blue.

## 2021-05-19 ENCOUNTER — Other Ambulatory Visit: Payer: Self-pay | Admitting: Neurology

## 2021-06-30 ENCOUNTER — Other Ambulatory Visit: Payer: Self-pay | Admitting: Physician Assistant

## 2021-07-01 ENCOUNTER — Other Ambulatory Visit: Payer: Self-pay | Admitting: Physician Assistant

## 2021-07-03 NOTE — Telephone Encounter (Signed)
Last filled 7/4

## 2021-07-09 ENCOUNTER — Ambulatory Visit: Payer: Medicare Other | Admitting: Physician Assistant

## 2021-07-09 ENCOUNTER — Encounter: Payer: Self-pay | Admitting: Physician Assistant

## 2021-07-09 ENCOUNTER — Other Ambulatory Visit: Payer: Self-pay

## 2021-07-09 DIAGNOSIS — G4700 Insomnia, unspecified: Secondary | ICD-10-CM | POA: Diagnosis not present

## 2021-07-09 DIAGNOSIS — E039 Hypothyroidism, unspecified: Secondary | ICD-10-CM

## 2021-07-09 DIAGNOSIS — F411 Generalized anxiety disorder: Secondary | ICD-10-CM | POA: Diagnosis not present

## 2021-07-09 DIAGNOSIS — F319 Bipolar disorder, unspecified: Secondary | ICD-10-CM | POA: Diagnosis not present

## 2021-07-09 MED ORDER — RISPERIDONE 4 MG PO TABS: 4.0000 mg | ORAL_TABLET | Freq: Every day | ORAL | 1 refills | Status: DC

## 2021-07-09 NOTE — Progress Notes (Signed)
[facility] Med Check  Patient ID: [name],  MRN: [id number]  PCP: Jimmy Hatchet, MD  Date of Evaluation: 07/09/2021 Time spent:30 minutes  Chief Complaint:  Chief Complaint   Anxiety; Depression      HISTORY/CURRENT STATUS: HPI For routine med check. Wife [name] is with him.  He and his wife recently moved into a town home.  He is happy with that change, they have lived in the same place for many years.   [name] says he often stares into space and he will drift off to sleep.  She tries to keep him awake during the day.  He says he has trouble sleeping at night but she reports that he really does not.  Since the last visit he weaned completely off trazodone.  As far as his mental health medications go he seems to be doing well.  Energy and motivation are low but the same for him.  He really does not enjoy much of anything mostly due to the physical limitations of his Parkinson's disease.  He likes to stay at home anyway so that is not bothersome.  Appetite is good and weight is stable.  No reports of extreme anxiety.  No major mood swings up or down.  No irritability or anger.  No suicidal thoughts.  No paranoia or hallucinations are reported.  Individual Medical History/ Review of Systems: Changes? :Yes    He still has the pressure sore of the left heel.  [name] states it is never going to heal up.  She dresses it every day.  Past medications for mental health diagnoses include: Lithium, Risperdal, Xanax, Synthroid, Latuda, trazodone, Depakote, Lamictal  Allergies: Codeine and Lamisil [terbinafine hcl]  Current Medications:  Current Outpatient Medications:    ALPRAZolam (XANAX) 1 MG tablet, TAKE ONE TABLET BY MOUTH THREE TIMES A DAY AS NEEDED FOR ANXIETY, Disp: 90 tablet, Rfl: 5   amLODipine (NORVASC) 5 MG tablet, Take 5 mg by mouth daily., Disp: , Rfl:    aspirin 81 MG EC tablet, Take 81 mg by mouth daily. , Disp: , Rfl:    atorvastatin (LIPITOR) 20 MG tablet, Take 20  mg by mouth at bedtime., Disp: , Rfl:    carbidopa-levodopa (SINEMET IR) 25-100 MG tablet, TAKE 1.5 TABLETS BY MOUTH THREE TIMES A DAY, Disp: 400 tablet, Rfl: 1   Cholecalciferol (VITAMIN D3) 1000 units CAPS, Take 1,000 Units by mouth daily., Disp: , Rfl:    gabapentin (NEURONTIN) 100 MG capsule, Take 100 mg by mouth at bedtime. , Disp: , Rfl:    levothyroxine (SYNTHROID) 75 MCG tablet, TAKE ONE TABLET BY MOUTH EVERY MORNING, Disp: 90 tablet, Rfl: 1   lithium carbonate 300 MG capsule, TAKE TWO CAPSULES BY MOUTH AT BEDTIME, Disp: 180 capsule, Rfl: 1   Polyethyl Glycol-Propyl Glycol (SYSTANE OP), Place 1 drop into both eyes 4 (four) times daily as needed (dry eyes)., Disp: , Rfl:    polyethylene glycol (MIRALAX / GLYCOLAX) 17 g packet, Take 17 g by mouth daily., Disp: , Rfl:    botulinum toxin Type A (BOTOX) 100 units SOLR injection, Inject 100 Units into the muscle every 3 (three) months. (Patient not taking: Reported on 07/09/2021), Disp: 1 each, Rfl: 3   Glycopyrrolate 1 MG/5ML SOLN, Take 2.5 mLs (0.5 mg total) by mouth 2 (two) times daily as needed. (Patient not taking: Reported on 07/09/2021), Disp: 500 mL, Rfl: 6   risperidone (RISPERDAL) 4 MG tablet, Take 1 tablet (4 mg total) by mouth at bedtime., Disp: 90  tablet, Rfl: 1 Medication Side Effects: hypersomnolence  Family Medical/ Social History: Changes?  See HPI  MENTAL HEALTH EXAM:  There were no vitals taken for this visit.There is no height or weight on file to calculate BMI.  General Appearance: Casual, Neat and Well Groomed  Eye Contact:  Good  Speech:  Clear and Coherent, Slow and Slurred due to PD  Volume:  Normal  Mood:  Euthymic  Affect:  Flat but he does laugh some  Thought Process:  Goal Directed and Descriptions of Associations: Intact  Orientation:  Full (Time, Place, and Person)  Thought Content: Logical   Suicidal Thoughts:  No  Homicidal Thoughts:  No  Memory:  WNL Difficult to assess though  Judgement:  Good   Insight:  Good  Psychomotor Activity:  Decreased, Shuffling Gait and has walking boot on left foot  Concentration:  Concentration: Good  Recall:  Good  Fund of Knowledge: Good  Language: Good  Assets:  Desire for Improvement  ADL's:  Impaired  Cognition: WNL  Prognosis:  Fair   01/15/2021 Lithium level 0.5  DIAGNOSES:    ICD-10-CM   1. Bipolar I disorder ([place])  F31.9     2. Generalized anxiety disorder  F41.1     3. Insomnia, unspecified type  G47.00     4. Hypothyroidism, unspecified type  E03.9       Receiving Psychotherapy: No    RECOMMENDATIONS: PDMP was reviewed.  Last Xanax filled 07/03/2021. I provided 30 minutes of face to face time during this encounter, including time spent before and after the visit in records review, medical decision making, and charting.  His mental health is stable, no changes in meds are necessary. Continue Xanax 1 mg, 1/2-1 3 times daily as needed. Continue gabapentin 100 mg 1 nightly. Continue lithium 300 mg, 2 p.o. nightly. Continue Synthroid 75 mcg q am. Continue Risperdal 4 mg nightly. D/C the Trazodone. He already has. Needs fasting lipid panel, lithium level, TSH, and BMP.  He usually has labs through PCP but I do not have results for anything more recent than 5 to 6 months ago.  We will have [place], LPN contact [facility] and we will send those orders or if he will be seeing his PCP soon and they will order those labs that will be fine, just get the results to me. Return in 6 months.  Donnal Moat, PA-C

## 2021-07-13 ENCOUNTER — Telehealth: Payer: Self-pay | Admitting: Physician Assistant

## 2021-07-13 NOTE — Telephone Encounter (Signed)
Please call his wife [name] and let her know that after I reviewed his chart in more detail I do not see any results for TSH, BMP, or lipid panel since 2020.  I know that I am not able to see all of his labs however.  If that has been done but is still around a year old, I would like to have those labs drawn, along with another lithium level in approximately 1 to 2 months.  I can order those labs or if he has an upcoming appointment with PCP and he can have them done then.  Let me know if I need to order and which lab they will go to.  Thanks

## 2021-07-14 NOTE — Telephone Encounter (Signed)
She will get them to fax over results from 2021

## 2021-07-14 NOTE — Telephone Encounter (Signed)
Noted  

## 2021-07-14 NOTE — Telephone Encounter (Signed)
LVM with info and to return call

## 2021-07-16 ENCOUNTER — Ambulatory Visit: Payer: Medicare Other | Admitting: Neurology

## 2021-07-30 ENCOUNTER — Other Ambulatory Visit: Payer: Self-pay | Admitting: Physician Assistant

## 2021-08-14 ENCOUNTER — Telehealth (INDEPENDENT_AMBULATORY_CARE_PROVIDER_SITE_OTHER): Payer: Medicare Other | Admitting: Neurology

## 2021-08-14 DIAGNOSIS — R531 Weakness: Secondary | ICD-10-CM

## 2021-08-14 DIAGNOSIS — G20 Parkinson's disease: Secondary | ICD-10-CM | POA: Diagnosis not present

## 2021-08-14 DIAGNOSIS — R269 Unspecified abnormalities of gait and mobility: Secondary | ICD-10-CM | POA: Diagnosis not present

## 2021-08-14 MED ORDER — CARBIDOPA-LEVODOPA 25-100 MG PO TABS: 1.5000 | ORAL_TABLET | Freq: Three times a day (TID) | ORAL | 3 refills | Status: DC

## 2021-08-14 NOTE — Progress Notes (Addendum)
ASSESSMENT AND PLAN 60 y.o. year old male   Secondary parkinsonism  Fairly symmetric, history of long-term psychiatric medication treatment, including Risperdal, lithium,  Was treated with Sinemet 25/100 mg since 2018, did report improvement, more gait improvement with higher dose of 1.5 mg 3 times a day, taking it at 9 AM, 1 PM, 7 PM,  There was no significant change after add on Sinemet CR 25/100 mg every night, wife noticed excessive sleepiness during the daytime, will   Sinemet CR has stopped  New onset left facial and arm weakness worsening gait abnormality  Previous gait evaluation was limited by his non-healing left heel ulcer, on today's examination, he was noted to have left lower face weakness, mild left upper extremity weakness, known history of peripheral vascular disease, vascular risk factor of sedentary, hyperlipidemia, hypertension, hypothyroidism,  Personally reviewed MRI of the brain in May 2022: No acute abnormalities, mild small vessel disease, minimal progression compared to 2018,  Is on aspirin 81 mg daily, also encourage moderate exercise, increase water intake  Excessive drooling  Related to his parkinsonian features, lack of motor coordination, no significant response with 3 trials of Botox injection from August 21 to February 2022, will stop Botox injection, give him a call however late trial of glycopyrrolate liquid form, low-dose, 0.5 mg twice daily as needed  Data Reviewed:   MRI of the brain without contrast on May 09, 2017: 1.  Some scattered T2/FLAIR hyperintense foci in the subcortical deep white matter of the hemispheres consistent with mild age-related chronic microvascular ischemic change. 2.  There are no acute findings. 3.  Brain volume is normal for age.  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   HISTORY OF  PRESENT ILLNESS: He has PMHx of HTN, hypothyroidism, on supplement, bipolar disorder, taking risperidone 4 mg daily for more than ten years, lithium 320m tid, report level was within normal limit, he is also taking alprazolam 144mtid. His psychiatrist is Dr. CuCandis SchatzHe had a lot of stress recently, his son died after struck by a truck in August 2013, his wife suffered stroke in August 2011.  He complains of bilateral hand tremor over the past few years, getting worse since 2013, fairly symmtric, if he holds anything more than 5 Lbs, for more than 10 minutes, he has right arm and hand shaking, he spills water when holding a glass of water. He denies significant gait difficulty, denies loss sense of smell. He denies family history of tremor.   Lab in July 25th 2014, showed normal CBC.      MRI of the brain done 07/19/2013 without acute findings. His tremor gets worse if he is anxious.   UPDATE Sep 9th 2015:He has gradually declining functional status, he is no longer working, sit at home most of the time, is no longer driving, he was recently evaluated by his psychiatrist Dr. CuCandis Schatzn June 2015, Lithium was decreased from 90057maily to 600 every day, he is also taking Risperdal 4 mg every day He has worsening memory trouble, constipation, restless he also reported worsening depression, to the point of suicidal thought sometimes    UPDATE Oct 26 2014: Sinemet 25/100 3 times a day since Sep 2015, has been very helpful, his tremor has much improved,  he continue has mild unsteady gait, recently complains of bilateral heel pain, thick skin at left medial heel.He is  still taking Risperdal 4 mg every night, Xanax 1 mg half to one tablet as needed, lithium 300 mg 2 tablets every night, Synthroid 75 g once every day   UPDATE Oct 15 2015: His tremor is better with sinemet,  He complains of right foot pain, planning on to have surgery soon. Today his wife is also concerned about his slow worsening gait  difficulty, unsteady gait, difficulty to clear his feet from the floor, worsening bilateral upper extremity weakness, bilateral deltoid muscle wasting    UPDATE January 28 2016: EMG nerve conduction study in October 30 2015 was normal, there was no evidence of large fiber peripheral neuropathy, right lumbar or right cervical radiculopathy. He is planning on to have right foot surgery for benign mass in January 30 2016   He is still on Sinemet 25/100 mg 3 times a day, which does help his tremor   UPDATE Jul 30 2016: Patient reported increased tremor, bradykinesia, memory loss since his lithium was increased from 600 mg to 900 mg every night per family this is based on laboratory evaluation, there is no significant change on patient mood,   In addition he is also taking Risperdal 4 mg once every night, trazodone 50 mg every night, thyroid supplement. He is very frustrated about his slow movement, memory loss, bilateral hands tremor, cannot even clip his fingernails   [facility] is [facility] of [facility] PA Donnal Moat Phone [phone]   UPDATE April 29 2017: He is with his wife, he has worsening memory loss, "just sit there",  Watch TV, he needs help dressing, bathing, wear depends.  He drinks 3 boost a day, lack of appetite, he sleeps well.   He is now back to lithium 623m daily,  He is so flat. His wife is going to retire April 30 2017 to take care of him.   This is a sudden change compared to 203-03-2012 he was a sLibrarian, academicof a warehouse, this a sudden change since his son died of accident in 203-03-2012  He had diagnosis of bipolar disorder since 2March 05, 2009    We have reviewed repeat CT chest without contrast on June 12th 2018: Stable right lung base subpleural mass,   I was able to talk with his psychiatrist PA TDonnal Moat, updated on the information, and also concerned about medication side effect   UPDATE Sept 11 2018:  He is accompanied by his wife at today's clinical visit, wife is  apparently very frustrated about his flat affect, lack of motivations, he needs more help in his daily activity, personal care   He is on lower dose of risperidone 318mqhs, trazodone 5030mhs,  Lithium 600m67ms, he continue to take Sinemet 25/100 mg 3 times a day, which has helped his hand tremor.   I personally reviewed MRI of the brain on May 09 2017, there is generalized atrophy mild supratentorial small vessel disease Extensive laboratory evaluation showed normal ESR, CPK, CBC showed mild elevated WBC 11 point 9, vitamin D was decreased 18 point 7, CMP showed mild elevated glucose 107  Update May 23, 2020: Patient was diagnosed with secondary Parkinson's disease, due to his long-term neurotrophic medication treatment, he was started on Sinemet 25/100 mg 3 times daily since 201803/05/18ich did help his symptoms,  Personally reviewed MRI of the brain most recent was in June 2018, generalized atrophy, supratentorium small vessel disease,  His Sinemet was increased from 1-1 and half 3 times a day due to his reported  increased gait abnormality, drooling,   Higher dose of Sinemet, did help his movement, but he is dealing with nonhealing left foot ulcer, has peripheral vascular disease, has pending procedure  Wife also reported that he has difficulty turning his body on bed, has to wake her up to help him using bathroom, he also had excessive drooling for more than 1 year, drooling into his plate, on his shirt, on the floor  UPDATE July 15 2020: This is his first Botox injection for hypersialorrhea, potential mechanical symptoms side effect explained, he tolerated the procedure well, used Botox a 100 units  Following last visit in July 2021, because of his complaints of difficulty turning at bedtime, restarted Sinemet CR 25/100 mg at bedtime, wife noticed that he continue have trouble sleeping, turning, also had excessive daytime sleepiness, there was no significant improvement adding on Sinemet  CR, will stop it today, continue immediate release Sinemet 25/100 mg 1 and half tablets 3 times a day at 9 a.m., 1 PM, 5 PM  UPDATE Apr 09 2021: He is accompanied by his wife at today's clinical visit, reported stable good response to Sinemet 25/100 mg 1 and half tablets 3 times a day at night a.m., 1 PM, 7 PM, does has difficulty turning at bedtime, but overall sleeps well, his left heel wound has healed, is out of boot, can walk much better now  We have tried Botox injection for excessive sialorrhea 3 times, wife did not notice any significant improvement, most bothersome is during the day especially when he tries to eat, also wet his pillow at nighttime  Update August 14, 2021 virtual visit via video: Location: [facility] [place] office, patient with his wife at home   I connected with [name] on 08/14/21 by a video enabled telemedicine application and verified that I am speaking with the correct person using two identifiers.  he is overall doing very well, continue have left heel nonhealing wound, continue mild gait abnormality, Sinemet 25/100 mg 1 and half tablets 3 times a day has been helpful He has excessive sialorrhea,with 3 trials of Botox injection from August 21 to February 2022, had a short trial of glycopyrrolate liquid form, low-dose, 0.5 mg twice daily as needed, did dry his mouth so much, he developed blisters  PHYSICAL EXAM Awake, alert, oriented to history taking care of conversation, mild slurred speech, parkinsonian features, decreased facial expression, cautious gait, holding left arm  REVIEW OF SYSTEMS: Out of a complete 14 system review of symptoms, the patient complains only of the following symptoms, and all other reviewed systems are negative.  Gait instability, falls  ALLERGIES: Allergies  Allergen Reactions   Codeine Other (See Comments)    Agitation/mean    Lamisil [Terbinafine Hcl]     Lost taste buds for 2 years    HOME MEDICATIONS: Outpatient  Medications Prior to Visit  Medication Sig Dispense Refill   ALPRAZolam (XANAX) 1 MG tablet TAKE ONE TABLET BY MOUTH THREE TIMES A DAY AS NEEDED FOR ANXIETY 90 tablet 5   amLODipine (NORVASC) 5 MG tablet Take 5 mg by mouth daily.     aspirin 81 MG EC tablet Take 81 mg by mouth daily.      atorvastatin (LIPITOR) 20 MG tablet Take 20 mg by mouth at bedtime.     botulinum toxin Type A (BOTOX) 100 units SOLR injection Inject 100 Units into the muscle every 3 (three) months. (Patient not taking: Reported on 07/09/2021) 1 each 3   carbidopa-levodopa (SINEMET IR)  25-100 MG tablet TAKE 1.5 TABLETS BY MOUTH THREE TIMES A DAY 400 tablet 1   Cholecalciferol (VITAMIN D3) 1000 units CAPS Take 1,000 Units by mouth daily.     gabapentin (NEURONTIN) 100 MG capsule Take 100 mg by mouth at bedtime.      Glycopyrrolate 1 MG/5ML SOLN Take 2.5 mLs (0.5 mg total) by mouth 2 (two) times daily as needed. (Patient not taking: Reported on 07/09/2021) 500 mL 6   levothyroxine (SYNTHROID) 75 MCG tablet TAKE ONE TABLET BY MOUTH EVERY MORNING 90 tablet 1   lithium carbonate 300 MG capsule TAKE TWO CAPSULES BY MOUTH EVERY NIGHT AT BEDTIME 180 capsule 1   Polyethyl Glycol-Propyl Glycol (SYSTANE OP) Place 1 drop into both eyes 4 (four) times daily as needed (dry eyes).     polyethylene glycol (MIRALAX / GLYCOLAX) 17 g packet Take 17 g by mouth daily.     risperidone (RISPERDAL) 4 MG tablet Take 1 tablet (4 mg total) by mouth at bedtime. 90 tablet 1   No facility-administered medications prior to visit.    PAST MEDICAL HISTORY: Past Medical History:  Diagnosis Date   Anxiety    Bipolar 1 disorder ([place])    Depression    High blood pressure    High cholesterol    Parkinson's disease ([place])    Peripheral neuropathy    Thyroid disease    Tremor     PAST SURGICAL HISTORY: Past Surgical History:  Procedure Laterality Date   HEMORRHOID SURGERY     1980's   IR ANGIOGRAM EXTREMITY LEFT  05/28/2020   IR RADIOLOGIST EVAL &  MGMT  03/14/2020   IR US GUIDE VASC ACCESS LEFT  05/28/2020   MULTIPLE TOOTH EXTRACTIONS     2016 Has top plate    FAMILY HISTORY: Family History  Problem Relation Age of Onset   Heart attack Mother    Liver disease Father     SOCIAL HISTORY: Social History   Socioeconomic History   Marital status: Married    Spouse name: [name]   Number of children: 1   Years of education: 12   Highest education level: Not on file  Occupational History    Comment: Not working  Tobacco Use   Smoking status: Former    Packs/day: 0.10    Types: Cigarettes    Quit date: 05/29/2019    Years since quitting: 2.2   Smokeless tobacco: Never   Tobacco comments:    only occasionally smokes  Vaping Use   Vaping Use: Never used  Substance and Sexual Activity   Alcohol use: No   Drug use: No    Comment: Quit 2009   Sexual activity: Not on file  Other Topics Concern   Not on file  Social History Narrative   Patient lives at home with with his wife [name]). Patient is not working at this time. Patient has a 12th grade education.    Caffeine - four mountain dews daily.   Right handed.   Patient has one child.   Social Determinants of Health   Financial Resource Strain: Not on file  Food Insecurity: Not on file  Transportation Needs: Not on file  Physical Activity: Not on file  Stress: Not on file  Social Connections: Not on file  Intimate Partner Violence: Not on file        Marcial Pacas, M.D. Ph.D.  [facility] [place], [place] [address] Phone: [phone] Fax:      [phone]

## 2021-08-17 ENCOUNTER — Other Ambulatory Visit: Payer: Self-pay | Admitting: Physician Assistant

## 2021-08-18 ENCOUNTER — Other Ambulatory Visit: Payer: Self-pay | Admitting: Physician Assistant

## 2021-10-25 ENCOUNTER — Other Ambulatory Visit: Payer: Self-pay | Admitting: Internal Medicine

## 2021-10-25 DIAGNOSIS — R911 Solitary pulmonary nodule: Secondary | ICD-10-CM

## 2021-11-19 ENCOUNTER — Ambulatory Visit
Admission: RE | Admit: 2021-11-19 | Discharge: 2021-11-19 | Disposition: A | Discharge status: Home / Self Care | Payer: Medicare Other | Source: Ambulatory Visit | Attending: Internal Medicine | Admitting: Internal Medicine

## 2021-11-19 ENCOUNTER — Other Ambulatory Visit: Payer: Self-pay | Admitting: Internal Medicine

## 2021-11-19 ENCOUNTER — Other Ambulatory Visit: Payer: Self-pay

## 2021-11-19 DIAGNOSIS — R911 Solitary pulmonary nodule: Secondary | ICD-10-CM

## 2022-01-08 ENCOUNTER — Encounter: Payer: Self-pay | Admitting: Physician Assistant

## 2022-01-08 ENCOUNTER — Other Ambulatory Visit: Payer: Self-pay

## 2022-01-08 ENCOUNTER — Ambulatory Visit: Payer: Medicare Other | Admitting: Physician Assistant

## 2022-01-08 DIAGNOSIS — I739 Peripheral vascular disease, unspecified: Secondary | ICD-10-CM

## 2022-01-08 DIAGNOSIS — E039 Hypothyroidism, unspecified: Secondary | ICD-10-CM

## 2022-01-08 DIAGNOSIS — F411 Generalized anxiety disorder: Secondary | ICD-10-CM | POA: Diagnosis not present

## 2022-01-08 DIAGNOSIS — G4700 Insomnia, unspecified: Secondary | ICD-10-CM | POA: Diagnosis not present

## 2022-01-08 DIAGNOSIS — F319 Bipolar disorder, unspecified: Secondary | ICD-10-CM | POA: Diagnosis not present

## 2022-01-08 DIAGNOSIS — Z79899 Other long term (current) drug therapy: Secondary | ICD-10-CM

## 2022-01-08 NOTE — Progress Notes (Signed)
[facility] Check  Patient ID: [name],  MRN: [id number]  PCP: Velna Hatchet, MD  Date of Evaluation: 01/08/2022 Time spent:40 minutes  Chief Complaint:  Chief Complaint   Anxiety; Depression; Follow-up     HISTORY/CURRENT STATUS: HPI For routine med check. Wife [name] is with him.  As far as his mental health medications go he seems to be doing well.  Energy and motivation are low but the same for him.  He really does not do anything except watch TV.  He never wants to go out of the house.  He does not cry easily.  He is sleeping really well now and does not sleep all during the day since being off the trazodone for several years now.  He does nap occasionally but it is only for a short while.  No suicidal or homicidal thoughts.  [name], his wife does everything in the house, cooking, cleaning, grocery shopping, and dressing and bathing him.  She teases him and said that he could do more if he would, which he agrees.  Due to the Parkinson's he has limited mobility, worsening in his hands bilaterally recently.  He still follows up with his primary provider and his neurologist.  No reports of extreme anxiety.  He does have generalized anxiety, especially if he has to go anywhere.  The Xanax does help.  No major mood swings up or down.  No irritability or anger. No paranoia or hallucinations are reported.  Denies sore throat earache, cough or cold symptoms, chest pain, palpitations, or shortness of breath.  No abdominal pain, nausea, vomiting, constipation, or diarrhea.  No genitourinary symptoms.  Denies dizziness, syncope, seizures, numbness, tingling, tremor, tics, or confusion.    Individual Medical History/ Review of Systems: Changes? : He still has a pressure sore on his left heel.  [name] states it will never heal up, and has been there for 3 years now.  She cleans and dresses it every day and tries to keep it from getting infected.      Past medications for mental health  diagnoses include: Lithium, Risperdal, Xanax, Synthroid, Latuda, trazodone, Depakote, Lamictal  Allergies: Codeine and Lamisil [terbinafine hcl]  Current Medications:  Current Outpatient Medications:    ALPRAZolam (XANAX) 1 MG tablet, TAKE ONE TABLET BY MOUTH THREE TIMES A DAY AS NEEDED FOR ANXIETY, Disp: 90 tablet, Rfl: 5   amLODipine (NORVASC) 5 MG tablet, Take 5 mg by mouth daily., Disp: , Rfl:    aspirin 81 MG EC tablet, Take 81 mg by mouth daily. , Disp: , Rfl:    atorvastatin (LIPITOR) 20 MG tablet, Take 20 mg by mouth at bedtime., Disp: , Rfl:    carbidopa-levodopa (SINEMET IR) 25-100 MG tablet, Take 1.5 tablets by mouth 3 (three) times daily., Disp: 410 tablet, Rfl: 3   Cholecalciferol (VITAMIN D3) 1000 units CAPS, Take 1,000 Units by mouth daily., Disp: , Rfl:    gabapentin (NEURONTIN) 100 MG capsule, Take 100 mg by mouth at bedtime. , Disp: , Rfl:    levothyroxine (SYNTHROID) 75 MCG tablet, TAKE ONE TABLET BY MOUTH EVERY MORNING, Disp: 90 tablet, Rfl: 1   lithium carbonate 300 MG capsule, TAKE TWO CAPSULES BY MOUTH EVERY NIGHT AT BEDTIME, Disp: 180 capsule, Rfl: 1   Polyethyl Glycol-Propyl Glycol (SYSTANE OP), Place 1 drop into both eyes 4 (four) times daily as needed (dry eyes)., Disp: , Rfl:    polyethylene glycol (MIRALAX / GLYCOLAX) 17 g packet, Take 17 g by mouth daily., Disp: ,  Rfl:    risperidone (RISPERDAL) 4 MG tablet, Take 1 tablet (4 mg total) by mouth at bedtime., Disp: 90 tablet, Rfl: 1   botulinum toxin Type A (BOTOX) 100 units SOLR injection, Inject 100 Units into the muscle every 3 (three) months. (Patient not taking: Reported on 07/09/2021), Disp: 1 each, Rfl: 3   Glycopyrrolate 1 MG/5ML SOLN, Take 2.5 mLs (0.5 mg total) by mouth 2 (two) times daily as needed. (Patient not taking: Reported on 07/09/2021), Disp: 500 mL, Rfl: 6 Medication Side Effects: none  Family Medical/ Social History: Changes?  See HPI  MENTAL HEALTH EXAM:  There were no vitals taken for this  visit.There is no height or weight on file to calculate BMI.  General Appearance: Casual, Neat and Well Groomed  Eye Contact:  Good  Speech:  Clear and Coherent, Slow and Slurred due to PD  Volume:  Normal  Mood:  Euthymic  Affect:  Flat but he does laugh some  Thought Process:  Goal Directed and Descriptions of Associations: Intact  Orientation:  Full (Time, Place, and Person)  Thought Content: Logical   Suicidal Thoughts:  No  Homicidal Thoughts:  No  Memory:  WNL Difficult to assess though  Judgement:  Good  Insight:  Good  Psychomotor Activity:  Decreased and Shuffling Gait  Concentration:  Concentration: Good  Recall:  Good  Fund of Knowledge: Good  Language: Good  Assets:  Desire for Improvement  ADL's:  Impaired  Cognition: WNL  Prognosis:  Fair   Labs from [place] drawn 10/14/2021 TSH 2.46 PSA 0.8 Vitamin D 46 CBC White count 9.8, hemoglobin 14.1, hematocrit 41.3, platelets 210 Lipid panel cholesterol 153, triglycerides 99, HDL 35, LDL 98 CMP creatinine 1.1 BUN 11, electrolytes and LFTs normal, glucose 100  no lithium level.  DIAGNOSES:    ICD-10-CM   1. Bipolar I disorder (HCC)  F31.9 Lithium level    2. Encounter for long-term (current) use of medications  Z79.899 Lithium level    3. Generalized anxiety disorder  F41.1     4. Insomnia, unspecified type  G47.00     5. Hypothyroidism, unspecified type  E03.9     6. Peripheral vascular disease (HCC)  I73.9        Receiving Psychotherapy: No    RECOMMENDATIONS: PDMP was reviewed.  Last Xanax filled 12/31/2021.   I provided 40 minutes of face to face time during this encounter, including time spent before and after the visit in records review, medical decision making, counseling pertinent to today's visit, and charting.  We discussed his labs that were done 3 months ago.  See results above that were faxed to me.  I did not see a lithium level so have ordered that to be done sometime  within the next month. As far as his mental health is concerned he is doing well so no changes need to be made. I did also stress the importance of movement.  The longer he sits around and does not move, the more difficult it will be to move when he wants to.  The old adage "moving will keep you moving" was discussed.  Continue Xanax 1 mg, 1/2-1 3 times daily as needed. Continue gabapentin 100 mg 1 nightly. Continue lithium 300 mg, 2 p.o. nightly. Continue Synthroid 75 mcg q am. Continue Risperdal 4 mg nightly. Return in 6 months.  Donnal Moat, PA-C

## 2022-01-13 ENCOUNTER — Telehealth: Payer: Self-pay | Admitting: Physician Assistant

## 2022-01-13 NOTE — Telephone Encounter (Signed)
Spoke with his wife, [name].  Lithium level drawn 01/08/2022 was 0.5.  He is doing well so no changes will be made.

## 2022-01-27 ENCOUNTER — Other Ambulatory Visit: Payer: Self-pay | Admitting: Physician Assistant

## 2022-02-11 ENCOUNTER — Other Ambulatory Visit: Payer: Self-pay | Admitting: Physician Assistant

## 2022-02-12 ENCOUNTER — Encounter: Payer: Self-pay | Admitting: Neurology

## 2022-02-12 ENCOUNTER — Telehealth (INDEPENDENT_AMBULATORY_CARE_PROVIDER_SITE_OTHER): Payer: Medicare Other | Admitting: Neurology

## 2022-02-12 DIAGNOSIS — G20 Parkinson's disease: Secondary | ICD-10-CM

## 2022-02-12 DIAGNOSIS — R413 Other amnesia: Secondary | ICD-10-CM

## 2022-02-12 DIAGNOSIS — R269 Unspecified abnormalities of gait and mobility: Secondary | ICD-10-CM

## 2022-02-12 MED ORDER — CARBIDOPA-LEVODOPA 25-100 MG PO TABS
1.5000 | ORAL_TABLET | Freq: Three times a day (TID) | ORAL | 3 refills | Status: DC
Start: 2022-02-12 — End: 2023-04-01

## 2022-02-12 NOTE — Progress Notes (Signed)
? ?ASSESSMENT AND PLAN ?61 y.o. year old male  ? ?Secondary parkinsonism ? Fairly symmetric, history of long-term psychiatric medication treatment, including Risperdal, lithium, ? Was treated with Sinemet 25/100 mg since 2018, did report improvement, more gait improvement with higher dose of 1.5 mg 3 times a day, taking it at 9 AM, 1 PM, 7 PM, ? There was no significant change after add on Sinemet CR 25/100 mg every night, wife noticed excessive sleepiness during the daytime, will   Sinemet CR has stopped ? ?New onset left facial and arm weakness worsening gait abnormality ? Previous gait evaluation was limited by his non-healing left heel ulcer, on today's examination, he was noted to have left lower face weakness, mild left upper extremity weakness, known history of peripheral vascular disease, vascular risk factor of sedentary, hyperlipidemia, hypertension, hypothyroidism, ? Personally reviewed MRI of the brain in May 2022: No acute abnormalities, mild small vessel disease, minimal progression compared to 2018, ? Is on aspirin 81 mg daily, also encourage moderate exercise, increase water intake ? ?Excessive drooling ? Related to his parkinsonian features, lack of motor coordination, no significant response with 3 trials of Botox injection from August 21 to February 2022, will stop Botox injection, give him a call however late trial of glycopyrrolate liquid form, low-dose, 0.5 mg twice daily as needed ? ?Data Reviewed:  ? ?MRI of the brain without contrast on May 09, 2017: ?1.  Some scattered T2/FLAIR hyperintense foci in the subcortical deep white matter of the hemispheres consistent with mild age-related chronic microvascular ischemic change. ?2.  There are no acute findings. ?3.  Brain volume is normal for age. ? ?May 28, 2020, ultrasound-guided left lower extremity angiogram ? No treatable tibial disease was discovered on the angiogram, with ?small vessel disease confirmed in the left foot. ?  ?HISTORY OF  PRESENT ILLNESS: ?He has PMHx of HTN, hypothyroidism, on supplement, bipolar disorder, taking risperidone 4 mg daily for more than ten years, lithium 319m tid, report level was within normal limit, he is also taking alprazolam 119mtid. His psychiatrist is Dr. CuCandis SchatzHe had a lot of stress recently, his son died after struck by a truck in August 2013, his wife suffered stroke in August 2011.  ?He complains of bilateral hand tremor over the past few years, getting worse since 2013, fairly symmtric, if he holds anything more than 5 Lbs, for more than 10 minutes, he has right arm and hand shaking, he spills water when holding a glass of water. He denies significant gait difficulty, denies loss sense of smell. He denies family history of tremor.   ?Lab in July 25th 2014, showed normal CBC.   ?   ?MRI of the brain done 07/19/2013 without acute findings. His tremor gets worse if he is anxious.   ?UPDATE Sep 9th 2015:He has gradually declining functional status, he is no longer working, sit at home most of the time, is no longer driving, he was recently evaluated by his psychiatrist Dr. CuCandis Schatzn June 2015, Lithium was decreased from 90065maily to 600 every day, he is also taking Risperdal 4 mg every day ?He has worsening memory trouble, constipation, restless he also reported worsening depression, to the point of suicidal thought sometimes  ?  ?UPDATE Oct 26 2014: ?Sinemet 25/100 3 times a day since Sep 2015, has been very helpful, his tremor has much improved,  he continue has mild unsteady gait, recently complains of bilateral heel pain, thick skin at left medial heel.He is  still taking Risperdal 4 mg every night, Xanax 1 mg half to one tablet as needed, lithium 300 mg 2 tablets every night, Synthroid 75 ?g once every day ?  ?UPDATE Oct 15 2015: ?His tremor is better with sinemet,  He complains of right foot pain, planning on to have surgery soon. Today his wife is also concerned about his slow worsening gait  difficulty, unsteady gait, difficulty to clear his feet from the floor, worsening bilateral upper extremity weakness, bilateral deltoid muscle wasting  ?  ?UPDATE January 28 2016: ?EMG nerve conduction study in October 30 2015 was normal, there was no evidence of large fiber peripheral neuropathy, right lumbar or right cervical radiculopathy. He is planning on to have right foot surgery for benign mass in January 30 2016 ?  ?He is still on Sinemet 25/100 mg 3 times a day, which does help his tremor ?  ?UPDATE Jul 30 2016: ?Patient reported increased tremor, bradykinesia, memory loss since his lithium was increased from 600 mg to 900 mg every night per family this is based on laboratory evaluation, there is no significant change on patient mood, ?  ?In addition he is also taking Risperdal 4 mg once every night, trazodone 50 mg every night, thyroid supplement. He is very frustrated about his slow movement, memory loss, bilateral hands tremor, cannot even clip his fingernails ?  ?He is under the care of [facility] PA Donnal Moat ?Phone [phone] ?  ?UPDATE April 29 2017: ?He is with his wife, he has worsening memory loss, "just sit there",  Watch TV, he needs help dressing, bathing, wear depends.  He drinks 3 boost a day, lack of appetite, he sleeps well. ?  ?He is now back to lithium 672m daily,  He is so flat. His wife is going to retire April 30 2017 to take care of him. ?  ?This is a sudden change compared to 2March 07, 2013 he was a sLibrarian, academicof a warehouse, this a sudden change since his son died of accident in 2March 07, 2013  He had diagnosis of bipolar disorder since 203/07/09  ?  ?We have reviewed repeat CT chest without contrast on June 12th 2018: Stable right lung base subpleural mass, ?  ?I was able to talk with his psychiatrist PA TDonnal Moat, updated on the information, and also concerned about medication side effect ?  ?UPDATE Sept 11 2018:  ?He is accompanied by his wife at today's clinical visit, wife is  apparently very frustrated about his flat affect, lack of motivations, he needs more help in his daily activity, personal care ?  ?He is on lower dose of risperidone 313mqhs, trazodone 5048mhs,  Lithium 600m96ms, he continue to take Sinemet 25/100 mg 3 times a day, which has helped his hand tremor. ?  ?I personally reviewed MRI of the brain on May 09 2017, there is generalized atrophy mild supratentorial small vessel disease ?Extensive laboratory evaluation showed normal ESR, CPK, CBC showed mild elevated WBC 11 point 9, vitamin D was decreased 18 point 7, CMP showed mild elevated glucose 107 ? ?Update May 23, 2020: ?Patient was diagnosed with secondary Parkinson's disease, due to his long-term neurotrophic medication treatment, he was started on Sinemet 25/100 mg 3 times daily since 2018Mar 07, 2018ich did help his symptoms, ? ?Personally reviewed MRI of the brain most recent was in June 2018, generalized atrophy, supratentorium small vessel disease, ? ?His Sinemet was increased from 1-1 and half 3 times a day due to his reported  increased gait abnormality, drooling,  ? ?Higher dose of Sinemet, did help his movement, but he is dealing with nonhealing left foot ulcer, has peripheral vascular disease, has pending procedure ? ?Wife also reported that he has difficulty turning his body on bed, has to wake her up to help him using bathroom, he also had excessive drooling for more than 1 year, drooling into his plate, on his shirt, on the floor ? ?UPDATE July 15 2020: ?This is his first Botox injection for hypersialorrhea, potential mechanical symptoms side effect explained, he tolerated the procedure well, used Botox a 100 units ? ?Following last visit in July 2021, because of his complaints of difficulty turning at bedtime, restarted Sinemet CR 25/100 mg at bedtime, wife noticed that he continue have trouble sleeping, turning, also had excessive daytime sleepiness, there was no significant improvement adding on Sinemet  CR, will stop it today, continue immediate release Sinemet 25/100 mg 1 and half tablets 3 times a day at 9 a.m., 1 PM, 5 PM ? ?UPDATE Apr 09 2021: ?He is accompanied by his wife at today's clinical visit,

## 2022-02-16 ENCOUNTER — Telehealth: Payer: Self-pay | Admitting: Neurology

## 2022-02-16 NOTE — Telephone Encounter (Signed)
Referral sent to [facility]. ?

## 2022-02-18 ENCOUNTER — Other Ambulatory Visit: Payer: Self-pay | Admitting: Physician Assistant

## 2022-02-24 ENCOUNTER — Encounter (HOSPITAL_BASED_OUTPATIENT_CLINIC_OR_DEPARTMENT_OTHER): Payer: Medicare Other | Attending: Internal Medicine | Admitting: Internal Medicine

## 2022-02-24 DIAGNOSIS — L89899 Pressure ulcer of other site, unspecified stage: Secondary | ICD-10-CM | POA: Diagnosis not present

## 2022-02-24 DIAGNOSIS — L89623 Pressure ulcer of left heel, stage 3: Secondary | ICD-10-CM | POA: Insufficient documentation

## 2022-02-24 DIAGNOSIS — G9009 Other idiopathic peripheral autonomic neuropathy: Secondary | ICD-10-CM | POA: Insufficient documentation

## 2022-02-24 DIAGNOSIS — G20 Parkinson's disease: Secondary | ICD-10-CM | POA: Insufficient documentation

## 2022-02-24 NOTE — Progress Notes (Signed)
Chavez, Jimmy ([id number]) ?Visit Report for 02/24/2022 ?Abuse Risk Screen Details ?Patient Name: Date of Service: ?[place], [place] 02/24/2022 12:30 PM ?Medical Record Number: [id number] ?Patient Account Number: [id number] ?Date of Birth/Sex: Treating RN: ?05-26-1961 (61 y.o. [name] ?Primary Care Kada Friesen: Velna Hatchet Other Clinician: ?Referring Deone Omahoney: ?Treating Acie Custis/Extender: Linton Ham ?Holwerda, Scott ?Weeks in Treatment: 0 ?Abuse Risk Screen Items ?Answer ?ABUSE RISK SCREEN: ?Has anyone close to you tried to hurt or harm you recentlyo No ?Do you feel uncomfortable with anyone in your familyo No ?Has anyone forced you do things that you didnt want to doo No ?Electronic Signature(s) ?Signed: 02/24/2022 4:54:57 PM By: Lorrin Jackson ?Entered By: Lorrin Jackson on 02/24/2022 12:54:16 ?-------------------------------------------------------------------------------- ?Activities of Daily Living Details ?Patient Name: Date of Service: ?[place], [place] 02/24/2022 12:30 PM ?Medical Record Number: [id number] ?Patient Account Number: [id number] ?Date of Birth/Sex: Treating RN: ?1961-07-07 (61 y.o. [name] ?Primary Care Jacoya Bauman: Velna Hatchet Other Clinician: ?Referring Desmen Schoffstall: ?Treating Aldon Hengst/Extender: Linton Ham ?Holwerda, Scott ?Weeks in Treatment: 0 ?Activities of Daily Living Items ?Answer ?Activities of Daily Living (Please select one for each item) ?Drive Automobile Not Able ?T Medications ?ake Need Assistance ?Use T elephone Need Assistance ?Care for Appearance Need Assistance ?Use T oilet Need Assistance ?Bath / Shower Need Assistance ?Dress Self Need Assistance ?Feed Self Completely Able ?Walk Completely Able ?Get In / Out Bed Completely Able ?Housework Need Assistance ?Prepare Meals Need Assistance ?Handle Money Need Assistance ?Shop for Self Need Assistance ?Electronic Signature(s) ?Signed: 02/24/2022 4:54:57 PM By: Lorrin Jackson ?Entered By: Lorrin Jackson on 02/24/2022  12:55:07 ?-------------------------------------------------------------------------------- ?Education Screening Details ?Patient Name: ?Date of Service: ?[place], [place] 02/24/2022 12:30 PM ?Medical Record Number: [id number] ?Patient Account Number: [id number] ?Date of Birth/Sex: ?Treating RN: ?03-09-1961 (61 y.o. [name] ?Primary Care [place] Viereck: Holwerda, Scott ?Other Clinician: ?Referring Husayn Reim: ?Treating Leanna Hamid/Extender: Linton Ham ?Holwerda, Scott ?Weeks in Treatment: 0 ?Primary Learner Assessed: Patient ?Learning Preferences/Education Level/Primary Language ?Learning Preference: Explanation, Demonstration ?Highest Education Level: High School ?Preferred Language: English ?Cognitive Barrier ?Language Barrier: No ?Translator Needed: No ?Memory Deficit: No ?Emotional Barrier: No ?Cultural/Religious Beliefs Affecting Medical Care: No ?Physical Barrier ?Impaired Vision: Yes Glasses ?Impaired Hearing: No ?Decreased Hand dexterity: No ?Knowledge/Comprehension ?Knowledge Level: Medium ?Comprehension Level: Medium ?Ability to understand written instructions: Medium ?Ability to understand verbal instructions: Medium ?Motivation ?Anxiety Level: Calm ?Cooperation: Cooperative ?Education Importance: Acknowledges Need ?Interest in Health Problems: Uninterested ?Perception: Coherent ?Willingness to Engage in Self-Management Medium ?Activities: ?Readiness to Engage in Self-Management Medium ?Activities: ?Electronic Signature(s) ?Signed: 02/24/2022 4:54:57 PM By: Lorrin Jackson ?Entered By: Lorrin Jackson on 02/24/2022 12:56:05 ?-------------------------------------------------------------------------------- ?Fall Risk Assessment Details ?Patient Name: ?Date of Service: ?[name], [place] 02/24/2022 12:30 PM ?Medical Record Number: [id number] ?Patient Account Number: [id number] ?Date of Birth/Sex: ?Treating RN: ?12/31/1960 (61 y.o. [name] ?Primary Care Girolamo Lortie: Holwerda, Scott ?Other Clinician: ?Referring  Amantha Sklar: ?Treating Honor Frison/Extender: Linton Ham ?Holwerda, Scott ?Weeks in Treatment: 0 ?Fall Risk Assessment Items ?Have you had 2 or more falls in the last 12 monthso 0 No ?Have you had any fall that resulted in injury in the last 12 monthso 0 No ?FALLS RISK SCREEN ?History of falling - immediate or within 3 months 0 No ?Secondary diagnosis (Do you have 2 or more medical diagnoseso) 15 Yes ?Ambulatory aid ?None/bed rest/wheelchair/nurse 0 Yes ?Crutches/cane/walker 0 No ?Furniture 0 No ?Intravenous therapy Access/Saline/Heparin Lock 0 No ?Gait/Transferring ?Normal/ bed rest/ wheelchair 0 Yes ?Weak (short steps with or without shuffle, stooped but able to lift head while walking, may seek  0 No ?support from furniture) ?Impaired (short steps with shuffle, may have difficulty arising from chair, head down, impaired 0 No ?balance) ?Mental Status ?Oriented to own ability 0 Yes ?Electronic Signature(s) ?Signed: 02/24/2022 4:54:57 PM By: Lorrin Jackson ?Entered By: Lorrin Jackson on 02/24/2022 12:56:23 ?-------------------------------------------------------------------------------- ?Foot Assessment Details ?Patient Name: ?Date of Service: ?[place], [place] 02/24/2022 12:30 PM ?Medical Record Number: [id number] ?Patient Account Number: [id number] ?Date of Birth/Sex: ?Treating RN: ?1961/09/13 (61 y.o. [name] ?Primary Care Lanyla Costello: Holwerda, Scott ?Other Clinician: ?Referring Vylet Maffia: ?Treating Sharnell Knight/Extender: Linton Ham ?Holwerda, Scott ?Weeks in Treatment: 0 ?Foot Assessment Items ?Site Locations ?+ = Sensation present, - = Sensation absent, C = Callus, U = Ulcer ?R = Redness, W = Warmth, M = Maceration, PU = Pre-ulcerative lesion ?F = Fissure, S = Swelling, D = Dryness ?Assessment ?Right: Left: ?Other Deformity: No No ?Prior Foot Ulcer: No No ?Prior Amputation: No No ?Charcot Joint: No No ?Ambulatory Status: Ambulatory Without Help ?Gait: Steady ?Electronic Signature(s) ?Signed: 02/24/2022 4:54:57 PM  By: Lorrin Jackson ?Entered By: Lorrin Jackson on 02/24/2022 13:05:13 ?-------------------------------------------------------------------------------- ?Nutrition Risk Screening Details ?Patient Name: ?Date of Service: ?[place], [place] 02/24/2022 12:30 PM ?Medical Record Number: [id number] ?Patient Account Number: [id number] ?Date of Birth/Sex: ?Treating RN: ?March 09, 1961 (61 y.o. [name] ?Primary Care [place] Severs: Holwerda, Scott ?Other Clinician: ?Referring Erique Kaser: ?Treating Neeva Trew/Extender: Linton Ham ?Holwerda, Scott ?Weeks in Treatment: 0 ?Height (in): 72 ?Weight (lbs): 169 ?Body Mass Index (BMI): 22.9 ?Nutrition Risk Screening Items ?Score Screening ?NUTRITION RISK SCREEN: ?I have an illness or condition that made me change the kind and/or amount of food I eat 0 No ?I eat fewer than two meals per day 0 No ?I eat few fruits and vegetables, or milk products 0 No ?I have three or more drinks of beer, liquor or wine almost every day 0 No ?I have tooth or mouth problems that make it hard for me to eat 0 No ?I don't always have enough money to buy the food I need 0 No ?I eat alone most of the time 0 No ?I take three or more different prescribed or over-the-counter drugs a day 1 Yes ?Without wanting to, I have lost or gained 10 pounds in the last six months 0 No ?I am not always physically able to shop, cook and/or feed myself 0 No ?Nutrition Protocols ?Good Risk Protocol 0 No interventions needed ?Moderate Risk Protocol ?High Risk Proctocol ?Risk Level: Good Risk ?Score: 1 ?Electronic Signature(s) ?Signed: 02/24/2022 4:54:57 PM By: Lorrin Jackson ?Entered By: Lorrin Jackson on 02/24/2022 12:56:37 ?

## 2022-02-24 NOTE — Progress Notes (Signed)
[facility], [facility] ([id number]) ?Visit Report for 02/24/2022 ?Allergy List Details ?Patient Name: Date of Service: ?[place], [place] 02/24/2022 12:30 PM ?Medical Record Number: [id number] ?Patient Account Number: [id number] ?Date of Birth/Sex: Treating RN: ?1961/01/31 (61 y.o. [name] ?Primary Care Tanaya Dunigan: Velna Hatchet Other Clinician: ?Referring Keneth Borg: ?Treating Krystianna Soth/Extender: Linton Ham ?Holwerda, Scott ?Weeks in Treatment: 0 ?Allergies ?Active Allergies ?Lamisil ?Reaction: lost sense of taste ?Severity: Moderate ?codeine ?Reaction: Aggressiveness ?Severity: Moderate ?Allergy Notes ?Electronic Signature(s) ?Signed: 02/24/2022 4:54:57 PM By: Lorrin Jackson ?Entered By: Lorrin Jackson on 02/24/2022 12:52:05 ?-------------------------------------------------------------------------------- ?Arrival Information Details ?Patient Name: Date of Service: ?[place], [place] 02/24/2022 12:30 PM ?Medical Record Number: [id number] ?Patient Account Number: [id number] ?Date of Birth/Sex: Treating RN: ?[birth year] (61 y.o. [name] ?Primary Care Daune Divirgilio: Velna Hatchet Other Clinician: ?Referring Loys Shugars: ?Treating Kasidi Shanker/Extender: Linton Ham ?Holwerda, Scott ?Weeks in Treatment: 0 ?Visit Information ?Patient Arrived: Ambulatory ?Arrival Time: 12:46 ?Accompanied By: wife ?Transfer Assistance: None ?Patient Identification Verified: Yes ?Secondary Verification Process Completed: Yes ?Patient Requires Transmission-Based Precautions: No ?Patient Has Alerts: Yes ?Patient Alerts: Patient on Blood Thinner ?History Since Last Visit ?Added or deleted any medications: No ?Any new allergies or adverse reactions: No ?Had a fall or experienced change in activities of daily living that may affect risk of falls: No ?Signs or symptoms of abuse/neglect since last visito No ?Hospitalized since last visit: No ?Implantable device outside of the clinic excluding cellular tissue based products placed in the center since last  visit: No ?Has Dressing in Place as Prescribed: Yes ?Pain Present Now: No ?Electronic Signature(s) ?Signed: 02/24/2022 4:54:57 PM By: Lorrin Jackson ?Entered By: Lorrin Jackson on 02/24/2022 12:49:02 ?-------------------------------------------------------------------------------- ?Clinic Level of Care Assessment Details ?Patient Name: Date of Service: ?[place], [place] 02/24/2022 12:30 PM ?Medical Record Number: [id number] ?Patient Account Number: [id number] ?Date of Birth/Sex: Treating RN: ?12-21-1960 (61 y.o. [name] ?Primary Care Eriyonna Matsushita: Velna Hatchet Other Clinician: ?Referring Nyeisha Goodall: ?Treating Armonie Staten/Extender: Linton Ham ?Holwerda, Scott ?Weeks in Treatment: 0 ?Clinic Level of Care Assessment Items ?TOOL 1 Quantity Score ?X- 1 0 ?Use when EandM and Procedure is performed on INITIAL visit ?ASSESSMENTS - Nursing Assessment / Reassessment ?X- 1 20 ?General Physical Exam (combine w/ comprehensive assessment (listed just below) when performed on new pt. evals) ?X- 1 25 ?Comprehensive Assessment (HX, ROS, Risk Assessments, Wounds Hx, etc.) ?ASSESSMENTS - Wound and Skin Assessment / Reassessment ?'[]'$  - 0 ?Dermatologic / Skin Assessment (not related to wound area) ?ASSESSMENTS - Ostomy and/or Continence Assessment and Care ?'[]'$  - 0 ?Incontinence Assessment and Management ?'[]'$  - 0 ?Ostomy Care Assessment and Management (repouching, etc.) ?PROCESS - Coordination of Care ?'[]'$  - 0 ?Simple Patient / Family Education for ongoing care ?X- 1 20 ?Complex (extensive) Patient / Family Education for ongoing care ?X- 1 10 ?Staff obtains Consents, Records, T Results / Process Orders ?est ?X- 1 10 ?Staff telephones HHA, Nursing Homes / Clarify orders / etc ?'[]'$  - 0 ?Routine Transfer to another Facility (non-emergent condition) ?'[]'$  - 0 ?Routine Hospital Admission (non-emergent condition) ?'[]'$  - 0 ?New Admissions / Biomedical engineer / Ordering NPWT Apligraf, etc. ?, ?'[]'$  - 0 ?Emergency Hospital Admission (emergent  condition) ?PROCESS - Special Needs ?'[]'$  - 0 ?Pediatric / Minor Patient Management ?'[]'$  - 0 ?Isolation Patient Management ?'[]'$  - 0 ?Hearing / Language / Visual special needs ?'[]'$  - 0 ?Assessment of Community assistance (transportation, D/C planning, etc.) ?'[]'$  - 0 ?Additional assistance / Altered mentation ?'[]'$  - 0 ?Support Surface(s) Assessment (bed, cushion, seat, etc.) ?INTERVENTIONS - Miscellaneous ?'[]'$  - 0 ?External  ear exam ?'[]'$  - 0 ?Patient Transfer (multiple staff / Civil Service fast streamer / Similar devices) ?'[]'$  - 0 ?Simple Staple / Suture removal (25 or less) ?'[]'$  - 0 ?Complex Staple / Suture removal (26 or more) ?'[]'$  - 0 ?Hypo/Hyperglycemic Management (do not check if billed separately) ?X- 1 15 ?Ankle / Brachial Index (ABI) - do not check if billed separately ?Has the patient been seen at the hospital within the last three years: Yes ?Total Score: 100 ?Level Of Care: New/Established - Level 3 ?Electronic Signature(s) ?Signed: 02/24/2022 4:54:57 PM By: Lorrin Jackson ?Entered By: Lorrin Jackson on 02/24/2022 13:51:46 ?-------------------------------------------------------------------------------- ?Encounter Discharge Information Details ?Patient Name: ?Date of Service: ?[place], [place] 02/24/2022 12:30 PM ?Medical Record Number: [id number] ?Patient Account Number: [id number] ?Date of Birth/Sex: ?Treating RN: ?11-22-1960 (61 y.o. [name] ?Primary Care Andriea Hasegawa: Holwerda, Scott ?Other Clinician: ?Referring Chante Mayson: ?Treating Marwin Primmer/Extender: Linton Ham ?Holwerda, Scott ?Weeks in Treatment: 0 ?Encounter Discharge Information Items Post Procedure Vitals ?Discharge Condition: Stable ?Temperature (F): 97.7 ?Ambulatory Status: Ambulatory ?Pulse (bpm): 74 ?Discharge Destination: Home ?Respiratory Rate (breaths/min): 18 ?Transportation: Private Auto ?Blood Pressure (mmHg): 128/83 ?Accompanied By: wife ?Schedule Follow-up Appointment: Yes ?Clinical Summary of Care: Provided on 02/24/2022 ?Form Type Recipient ?Paper Patient  Patient ?Electronic Signature(s) ?Signed: 02/24/2022 4:54:57 PM By: Lorrin Jackson ?Entered By: Lorrin Jackson on 02/24/2022 14:10:09 ?-------------------------------------------------------------------------------- ?Lower Extremity Assessment Details ?Patient Name: ?Date of Service: ?[place], [place] 02/24/2022 12:30 PM ?Medical Record Number: [id number] ?Patient Account Number: [id number] ?Date of Birth/Sex: ?Treating RN: ?[birth year] (61 y.o. [name] ?Primary Care Guinevere Stephenson: Holwerda, Scott ?Other Clinician: ?Referring Khristian Phillippi: ?Treating Kailin Principato/Extender: Linton Ham ?Holwerda, Scott ?Weeks in Treatment: 0 ?Edema Assessment ?Assessed: [Left: Yes] [Right: Yes] ?Edema: [Left: Yes] [Right: Yes] ?Calf ?Left: Right: ?Point of Measurement: 34 cm From Medial Instep 36.5 cm 35 cm ?Ankle ?Left: Right: ?Point of Measurement: 12 cm From Medial Instep 22.6 cm 24 cm ?Knee To Floor ?Left: Right: ?From Medial Instep 44 cm 44 cm ?Vascular Assessment ?Pulses: ?Dorsalis Pedis ?Palpable: [Left:Yes] [Right:Yes] ?Doppler Audible: [Left:Yes] [Right:Yes] ?Blood Pressure: ?Brachial: [Right:125 125] ?Ankle: ?[Right:Dorsalis Pedis: 110 ?Dorsalis Pedis: 120 0.88 0.96] ?Electronic Signature(s) ?Signed: 02/24/2022 4:54:57 PM By: Lorrin Jackson ?Entered By: Lorrin Jackson on 02/24/2022 13:14:50 ?-------------------------------------------------------------------------------- ?Multi-Disciplinary Care Plan Details ?Patient Name: ?Date of Service: ?[place], [place] 02/24/2022 12:30 PM ?Medical Record Number: [id number] ?Patient Account Number: [id number] ?Date of Birth/Sex: ?Treating RN: ?09/16/1961 (61 y.o. [name] ?Primary Care Kriss Perleberg: Holwerda, Scott ?Other Clinician: ?Referring Tiahna Cure: ?Treating Paisyn Guercio/Extender: Linton Ham ?Holwerda, Scott ?Weeks in Treatment: 0 ?Active Inactive ?Pressure ?Nursing Diagnoses: ?Knowledge deficit related to management of pressures ulcers ?Goals: ?Patient/caregiver will verbalize  understanding of pressure ulcer management ?Date Initiated: 02/24/2022 ?Target Resolution Date: 03/24/2022 ?Goal Status: Active ?Interventions: ?Assess: immobility, friction, shearing, incontinence upon admission and as ne

## 2022-02-24 NOTE — Progress Notes (Signed)
[facility], B[facility] ([id number]) ?Visit Report for 02/24/2022 ?Chief Complaint Document Details ?Patient Name: Date of Service: ?[place], [place] 02/24/2022 12:30 PM ?Medical Record Number: [id number] ?Patient Account Number: [id number] ?Date of Birth/Sex: Treating RN: ?Oct 28, 1961 (61 y.o. M) ?Primary Care Provider: Velna Hatchet Other Clinician: ?Referring Provider: ?Treating Provider/Extender: Linton Ham ?Holwerda, Scott ?Weeks in Treatment: 0 ?Information Obtained from: Patient ?Chief Complaint ?06/01/2019; patient is here for review of the wound on the tip of his left first toe ?02/24/2022; patient returns to clinic for review of wounds on the tip of his right first toe and left heel ?Electronic Signature(s) ?Signed: 02/24/2022 4:12:10 PM By: Linton Ham MD ?Entered By: Linton Ham on 02/24/2022 13:56:26 ?-------------------------------------------------------------------------------- ?Debridement Details ?Patient Name: Date of Service: ?[place], [place] 02/24/2022 12:30 PM ?Medical Record Number: [id number] ?Patient Account Number: [id number] ?Date of Birth/Sex: Treating RN: ?08/26/1961 (61 y.o. [name] ?Primary Care Provider: Velna Hatchet Other Clinician: ?Referring Provider: ?Treating Provider/Extender: Linton Ham ?Holwerda, Scott ?Weeks in Treatment: 0 ?Debridement Performed for Assessment: Wound #3 Right T Great ?oe ?Performed By: Physician Ricard Dillon., MD ?Debridement Type: Chemical/Enzymatic/Mechanical ?Agent Used: gauze and wound cleanser ?Level of Consciousness (Pre-procedure): Awake and Alert ?Pre-procedure Verification/Time Out Yes - 13:39 ?Taken: ?Start Time: 13:42 ?Bleeding: None ?End Time: 13:46 ?Response to Treatment: Procedure was tolerated well ?Level of Consciousness (Post- Awake and Alert ?procedure): ?Post Debridement Measurements of Total Wound ?Length: (cm) 0.7 ?Stage: Category/Stage III ?Width: (cm) 1 ?Depth: (cm) 0.1 ?Volume: (cm?) 0.055 ?Character of Wound/Ulcer Post  Debridement: Stable ?Post Procedure Diagnosis ?Same as Pre-procedure ?Electronic Signature(s) ?Signed: 02/24/2022 4:12:10 PM By: Linton Ham MD ?Signed: 02/24/2022 4:54:57 PM By: Lorrin Jackson ?Signed: 02/24/2022 4:54:57 PM By: Lorrin Jackson ?Entered By: Lorrin Jackson on 02/24/2022 13:46:38 ?-------------------------------------------------------------------------------- ?Debridement Details ?Patient Name: ?Date of Service: ?[place], [place] 02/24/2022 12:30 PM ?Medical Record Number: [id number] ?Patient Account Number: [id number] ?Date of Birth/Sex: ?Treating RN: ?23-Mar-1961 (61 y.o. M) ?Primary Care Provider: Holwerda, Scott ?Other Clinician: ?Referring Provider: ?Treating Provider/Extender: Linton Ham ?Holwerda, Scott ?Weeks in Treatment: 0 ?Debridement Performed for Assessment: Wound #2 Left Calcaneus ?Performed By: Physician Ricard Dillon., MD ?Debridement Type: Debridement ?Level of Consciousness (Pre-procedure): Awake and Alert ?Pre-procedure Verification/Time Out Yes - 13:39 ?Taken: ?Start Time: 13:40 ?Pain Control: ?Other : Benzocaine ?T Area Debrided (L x W): ?otal 3 (cm) x 2.5 (cm) = 7.5 (cm?) ?Tissue and other material debrided: ?Non-Viable, Eschar, Skin: Dermis ?Level: Skin/Dermis ?Debridement Description: Selective/Open Wound ?Instrument: Curette ?Bleeding: Minimum ?Hemostasis Achieved: Pressure ?End Time: 13:42 ?Response to Treatment: Procedure was tolerated well ?Level of Consciousness (Post- Awake and Alert ?procedure): ?Post Debridement Measurements of Total Wound ?Length: (cm) 3 ?Stage: Category/Stage III ?Width: (cm) 2.1 ?Depth: (cm) 0.1 ?Volume: (cm?) 0.495 ?Character of Wound/Ulcer Post Debridement: Stable ?Post Procedure Diagnosis ?Same as Pre-procedure ?Electronic Signature(s) ?Signed: 02/24/2022 4:12:10 PM By: Linton Ham MD ?Entered By: Linton Ham on 02/24/2022 13:55:48 ?-------------------------------------------------------------------------------- ?HPI Details ?Patient  Name: ?Date of Service: ?[place], [place] 02/24/2022 12:30 PM ?Medical Record Number: [id number] ?Patient Account Number: [id number] ?Date of Birth/Sex: ?Treating RN: ?12-Feb-1961 (61 y.o. M) ?Primary Care Provider: Holwerda, Scott ?Other Clinician: ?Referring Provider: ?Treating Provider/Extender: Linton Ham ?Holwerda, Scott ?Weeks in Treatment: 0 ?History of Present Illness ?HPI Description: ADMISSION ?06/01/2019 ?This is a 61 year old man who apparently was [place] the [place] and stubbed both of his right great toes at the tip. He had an open wound on both sides the ?left is since healed but the area on the right has been refractory. His wife  is been soaking his feet in Epsom salts applying Neosporin and more recently ?somebody told him to apply Vicks to this area and that is what they have been doing. They are frustrated by the lack of healing. They saw their primary ?physician I think via a telehealth visit on June 9 and they were referred here. The patient is not a diabetic he is an ex-smoker perhaps smoking only 1 to 2 ?cigarettes a week when his wife can stop him. ?Past medical history includes bipolar disorder, Parkinsonism/Parkinson's disease., Hypertension, hypothyroidism, COPD and nondiabetic neuropathy ?The patient had arterial studies done also via telehealth. On the left his ABI was 1.07 with triphasic waveforms. TBI was 1.05. On the right ABI 1.01 with ?triphasic waveforms and a TBI of 1.07 ?06/08/2019; traumatic wounds on the right great toe. This is just about closed using silver collagen. ?7/31; The area on the tip of the right great toe is closed ?READMISSION ?02/24/22 ?This is a now 61 year old man that we had in clinic during the summer 2020 with bilateral wounds on the tip of his great toes which were traumatic in the setting ?of significant idiopathic peripheral neuropathy. He is not a diabetic. We managed to get these to heal out. ?He is here with a new wound over the tip of his right first  toe once again which has been there for about a month. He also has a wound on the tip of his left ?heel which his wife says has been there for about 3 years although we certainly did not identify it during his stay in this clinic in 2020 he has been using ?Betadine I think directed by Dr. Posey Pronto of podiatry who last saw him on 02/07/2022 as well as collagen to the right first toe. In reviewing Dr. Serita Grit note from ?02/07/2022 he was talking about the left heel being a stage I injury that was "clinically healed". His wife says that this will close and then reopen. ?Past medical history essentially unchanged he has anxiety, bipolar disease, hypertension, Parkinson's disease and nondiabetic peripheral neuropathy which is ?severe according to his wife. ?ABIs in our clinic were 0.96 on the right and 0.88 on the left ?Electronic Signature(s) ?Signed: 02/24/2022 4:12:10 PM By: Linton Ham MD ?Entered By: Linton Ham on 02/24/2022 14:00:01 ?-------------------------------------------------------------------------------- ?Physical Exam Details ?Patient Name: Date of Service: ?[place], [place] 02/24/2022 12:30 PM ?Medical Record Number: [id number] ?Patient Account Number: [id number] ?Date of Birth/Sex: Treating RN: ?04-06-1961 (61 y.o. M) ?Primary Care Provider: Velna Hatchet Other Clinician: ?Referring Provider: ?Treating Provider/Extender: Linton Ham ?Holwerda, Scott ?Weeks in Treatment: 0 ?Constitutional ?Sitting or standing Blood Pressure is within target range for patient.. Pulse regular and within target range for patient.Marland Kitchen Respirations regular, non-labored and ?within target range.. Temperature is normal and within the target range for the patient.Marland Kitchen Appears in no distress. ?Cardiovascular ?Pedal pulses are palpable. ?Notes ?Wound exam ?He has an area over the tip of his right first toe. Although this looks superficial there is not much tissue covering bone here. Nevertheless there is no ?evidence of infection. No  mechanical debridement was required ?On the tip of his left heel 2 or 3 open areas covered in eschar I remove this with a #3 curette these do not have palpable bone underneath however there is ?not appear to

## 2022-03-10 ENCOUNTER — Encounter (HOSPITAL_BASED_OUTPATIENT_CLINIC_OR_DEPARTMENT_OTHER): Payer: Medicare Other | Admitting: Internal Medicine

## 2022-03-10 DIAGNOSIS — L89623 Pressure ulcer of left heel, stage 3: Secondary | ICD-10-CM | POA: Diagnosis not present

## 2022-03-10 NOTE — Progress Notes (Signed)
CAMERIN, LADOUCEUR ([id number]) ?Visit Report for 03/10/2022 ?Arrival Information Details ?Patient Name: Date of Service: ?[place], [place] 03/10/2022 12:30 PM ?Medical Record Number: [id number] ?Patient Account Number: [id number] ?Date of Birth/Sex: Treating RN: ?1961-02-04 (61 y.o. [name] ?Primary Care Christeena Krogh: Velna Hatchet Other Clinician: ?Referring Staphanie Harbison: ?Treating Caydon Feasel/Extender: Kalman Shan ?Holwerda, Scott ?Weeks in Treatment: 2 ?Visit Information History Since Last Visit ?Added or deleted any medications: No ?Patient Arrived: Ambulatory ?Any new allergies or adverse reactions: No ?Arrival Time: 12:44 ?Had a fall or experienced change in No ?Accompanied By: wife ?activities of daily living that may affect ?Transfer Assistance: None ?risk of falls: ?Patient Identification Verified: Yes ?Signs or symptoms of abuse/neglect since last visito No ?Secondary Verification Process Completed: Yes ?Hospitalized since last visit: No ?Patient Requires Transmission-Based Precautions: No ?Implantable device outside of the clinic excluding No ?Patient Has Alerts: Yes ?cellular tissue based products placed in the center ?Patient Alerts: Patient on Blood Thinner since last visit: ?Has Dressing in Place as Prescribed: Yes ?Pain Present Now: No ?Electronic Signature(s) ?Signed: 03/10/2022 4:50:27 PM By: Lorrin Jackson ?Entered By: Lorrin Jackson on 03/10/2022 12:47:16 ?-------------------------------------------------------------------------------- ?Encounter Discharge Information Details ?Patient Name: Date of Service: ?[place], [place] 03/10/2022 12:30 PM ?Medical Record Number: [id number] ?Patient Account Number: [id number] ?Date of Birth/Sex: Treating RN: ?May 11, 1961 (61 y.o. [name] ?Primary Care Kimmarie Pascale: Velna Hatchet Other Clinician: ?Referring Thaddius Manes: ?Treating Garris Melhorn/Extender: Kalman Shan ?Holwerda, Scott ?Weeks in Treatment: 2 ?Encounter Discharge Information Items Post Procedure  Vitals ?Discharge Condition: Stable ?Temperature (F): 98.3 ?Ambulatory Status: Ambulatory ?Pulse (bpm): 86 ?Discharge Destination: Home ?Respiratory Rate (breaths/min): 16 ?Transportation: Private Auto ?Blood Pressure (mmHg): 118/81 ?Schedule Follow-up Appointment: Yes ?Clinical Summary of Care: Provided on 03/10/2022 ?Form Type Recipient ?Paper Patient Patient ?Electronic Signature(s) ?Signed: 03/10/2022 4:50:27 PM By: Lorrin Jackson ?Entered By: Lorrin Jackson on 03/10/2022 13:39:06 ?-------------------------------------------------------------------------------- ?Lower Extremity Assessment Details ?Patient Name: ?Date of Service: ?[place], [place] 03/10/2022 12:30 PM ?Medical Record Number: [id number] ?Patient Account Number: [id number] ?Date of Birth/Sex: ?Treating RN: ?24-Mar-1961 (61 y.o. [name] ?Primary Care Shyleigh Daughtry: Holwerda, Scott ?Other Clinician: ?Referring Seri Kimmer: ?Treating Ambers Iyengar/Extender: Kalman Shan ?Holwerda, Scott ?Weeks in Treatment: 2 ?Edema Assessment ?Assessed: [Left: Yes] [Right: Yes] ?Edema: [Left: Yes] [Right: Yes] ?Calf ?Left: Right: ?Point of Measurement: 34 cm From Medial Instep 36.5 cm 35 cm ?Ankle ?Left: Right: ?Point of Measurement: 12 cm From Medial Instep 22.6 cm 24 cm ?Vascular Assessment ?Pulses: ?Dorsalis Pedis ?Palpable: [Left:Yes] [Right:Yes] ?Electronic Signature(s) ?Signed: 03/10/2022 4:50:27 PM By: Lorrin Jackson ?Entered By: Lorrin Jackson on 03/10/2022 12:51:16 ?-------------------------------------------------------------------------------- ?Multi Wound Chart Details ?Patient Name: ?Date of Service: ?[place], [place] 03/10/2022 12:30 PM ?Medical Record Number: [id number] ?Patient Account Number: [id number] ?Date of Birth/Sex: ?Treating RN: ?09-11-1961 (61 y.o. [name] ?Primary Care Jentri Aye: Holwerda, Scott ?Other Clinician: ?Referring Davius Goudeau: ?Treating Deepa Barthel/Extender: Kalman Shan ?Holwerda, Scott ?Weeks in Treatment: 2 ?Vital Signs ?Height(in):  72 ?Pulse(bpm): 86 ?Weight(lbs): 169 ?Blood Pressure(mmHg): 118/81 ?Body Mass Index(BMI): 22.9 ?Temperature(??F): 98.3 ?Respiratory Rate(breaths/min): 16 ?Photos: [2:Left Calcaneus] [3:Right T Great oe] [N/A:N/A N/A] ?Wound Location: [2:Pressure Injury] [3:Pressure Injury] [N/A:N/A] ?Wounding Event: [2:Pressure Ulcer] [3:Pressure Ulcer] [N/A:N/A] ?Primary Etiology: [2:Chronic Obstructive Pulmonary] [3:Chronic Obstructive Pulmonary] [N/A:N/A] ?Comorbid History: [2:Disease (COPD), Hypertension, Neuropathy 02/22/2019] [3:Disease (COPD), Hypertension, Neuropathy 01/23/2022] [N/A:N/A] ?Date Acquired: [2:2] [3:2] [N/A:N/A] ?Weeks of Treatment: [2:Open] [3:Open] [N/A:N/A] ?Wound Status: [2:No] [3:No] [N/A:N/A] ?Wound Recurrence: [2:2x2.5x0.1] [3:1.2x1.2x0.1] [N/A:N/A] ?Measurements L x W x D (cm) [2:3.927] [3:1.131] [N/A:N/A] ?A (cm?) : ?rea [2:0.393] [3:0.113] [N/A:N/A] ?Volume (cm?) : [2:33.30%] [3:-105.60%] [N/A:N/A] ?% Reduction in  A [2:rea: 33.30%] [3:-105.50%] [N/A:N/A] ?% Reduction in Volume: [2:Category/Stage III] [3:Category/Stage III] [N/A:N/A] ?Classification: [2:Medium] [3:Medium] [N/A:N/A] ?Exudate A mount: [2:Serosanguineous] [3:Serosanguineous] [N/A:N/A] ?Exudate Type: [2:red, brown] [3:red, brown] [N/A:N/A] ?Exudate Color: [2:Distinct, outline attached] [3:Distinct, outline attached] [N/A:N/A] ?Wound Margin: [2:Medium (34-66%)] [3:Large (67-100%)] [N/A:N/A] ?Granulation A mount: [2:Red] [3:Red] [N/A:N/A] ?Granulation Quality: [2:Medium (34-66%)] [3:None Present (0%)] [N/A:N/A] ?Necrotic A mount: [2:Eschar] [3:N/A] [N/A:N/A] ?Necrotic Tissue: ?[2:Fat Layer (Subcutaneous Tissue): Yes Fat Layer (Subcutaneous Tissue): Yes N/A] ?Exposed Structures: ?[2:Fascia: No Tendon: No Muscle: No Joint: No Bone: No None] [3:Fascia: No Tendon: No Muscle: No Joint: No Bone: No None] [N/A:N/A] ?Epithelialization: [2:Debridement - Excisional] [3:N/A] [N/A:N/A] ?Debridement: ?Pre-procedure Verification/Time Out 12:54 [3:N/A]  [N/A:N/A] ?Taken: [2:Other] [3:N/A] [N/A:N/A] ?Pain Control: [2:Necrotic/Eschar, Subcutaneous] [3:N/A] [N/A:N/A] ?Tissue Debrided: [2:Skin/Subcutaneous Tissue] [3:N/A] [N/A:N/A] ?Level: [2:5] [3:N/A] [N/A:N/A] ?Debridement A (sq cm): [2:rea Curette] [3:N/A] [N/A:N/A] ?Instrument: [2:Minimum] [3:N/A] [N/A:N/A] ?Bleeding: [2:Pressure] [3:N/A] [N/A:N/A] ?Hemostasis A chieved: [2:Procedure was tolerated well] [3:N/A] [N/A:N/A] ?Debridement Treatment Response: [2:2x2.5x0.1] [3:N/A] [N/A:N/A] ?Post Debridement Measurements L x ?W x D (cm) [2:0.393] [3:N/A] [N/A:N/A] ?Post Debridement Volume: (cm?) [2:Category/Stage III] [3:N/A] [N/A:N/A] ?Post Debridement Stage: [2:Debridement] [3:N/A] [N/A:N/A] ?Treatment Notes ?Wound #2 (Calcaneus) Wound Laterality: Left ?Cleanser ?Soap and Water ?Discharge Instruction: May shower and wash wound with dial antibacterial soap and water prior to dressing change. ?Peri-Wound Care ?Topical ?Primary Dressing ?Hydrofera Blue Ready Foam, 2.5 x2.5 in ?Discharge Instruction: Apply to wound bed as instructed ?Secondary Dressing ?ALLEVYN Heel 4 1/2in x 5 1/2in / 10.5cm x 13.5cm ?Discharge Instruction: Apply over primary dressing as directed. ?Cosmopor Wound Dressing, 4x4 (in/in) (Border Gauze) ?Discharge Instruction: Apply bordered gauze or foam border over primary dressing. ?Secured With ?Compression Wrap ?Compression Stockings ?Add-Ons ?Wound #3 (Toe Great) Wound Laterality: Right ?Cleanser ?Soap and Water ?Discharge Instruction: May shower and wash wound with dial antibacterial soap and water prior to dressing change. ?Peri-Wound Care ?Topical ?Primary Dressing ?Hydrofera Blue Ready Foam, 2.5 x2.5 in ?Discharge Instruction: Apply to wound bed as instructed ?Secondary Dressing ?Bordered Gauze, 2x2 in ?Discharge Instruction: Apply over primary dressing as directed. ?Secured With ?Compression Wrap ?Compression Stockings ?Add-Ons ?Electronic Signature(s) ?Signed: 03/10/2022 2:15:15 PM By: Kalman Shan DO ?Signed: 03/10/2022 4:50:27 PM By: Lorrin Jackson ?Entered By: Kalman Shan on 03/10/2022 13:43:44 ?-------------------------------------------------------------------------------- ?Multi-Disciplinary Care P

## 2022-03-10 NOTE — Progress Notes (Signed)
CHRISTOHPER, DUBE ([id number]) ?Visit Report for 03/10/2022 ?Chief Complaint Document Details ?Patient Name: Date of Service: ?[place], [place] 03/10/2022 12:30 PM ?Medical Record Number: [id number] ?Patient Account Number: [id number] ?Date of Birth/Sex: Treating RN: ?1961/07/19 (61 y.o. [name] ?Primary Care Provider: Velna Hatchet Other Clinician: ?Referring Provider: ?Treating Provider/Extender: Kalman Shan ?Holwerda, Scott ?Weeks in Treatment: 2 ?Information Obtained from: Patient ?Chief Complaint ?06/01/2019; patient is here for review of the wound on the tip of his left first toe ?02/24/2022; patient returns to clinic for review of wounds on the tip of his right first toe and left heel ?Electronic Signature(s) ?Signed: 03/10/2022 2:15:15 PM By: Kalman Shan DO ?Entered By: Kalman Shan on 03/10/2022 13:43:56 ?-------------------------------------------------------------------------------- ?Debridement Details ?Patient Name: Date of Service: ?[place], [place] 03/10/2022 12:30 PM ?Medical Record Number: [id number] ?Patient Account Number: [id number] ?Date of Birth/Sex: Treating RN: ?June 12, 1961 (61 y.o. [name] ?Primary Care Provider: Velna Hatchet Other Clinician: ?Referring Provider: ?Treating Provider/Extender: Kalman Shan ?Holwerda, Scott ?Weeks in Treatment: 2 ?Debridement Performed for Assessment: Wound #2 Left Calcaneus ?Performed By: Physician Kalman Shan, DO ?Debridement Type: Debridement ?Level of Consciousness (Pre-procedure): Awake and Alert ?Pre-procedure Verification/Time Out Yes - 12:54 ?Taken: ?Start Time: 12:55 ?Pain Control: ?Other : Benzocaine ?T Area Debrided (L x W): ?otal 2 (cm) x 2.5 (cm) = 5 (cm?) ?Tissue and other material debrided: Eschar, Subcutaneous ?Level: Skin/Subcutaneous Tissue ?Debridement Description: Excisional ?Instrument: Curette ?Bleeding: Minimum ?Hemostasis Achieved: Pressure ?End Time: 13:00 ?Response to Treatment: Procedure was tolerated  well ?Level of Consciousness (Post- Awake and Alert ?procedure): ?Post Debridement Measurements of Total Wound ?Length: (cm) 2 ?Stage: Category/Stage III ?Width: (cm) 2.5 ?Depth: (cm) 0.1 ?Volume: (cm?) 0.393 ?Character of Wound/Ulcer Post Debridement: Stable ?Post Procedure Diagnosis ?Same as Pre-procedure ?Electronic Signature(s) ?Signed: 03/10/2022 2:15:15 PM By: Kalman Shan DO ?Signed: 03/10/2022 4:50:27 PM By: Lorrin Jackson ?Entered By: Lorrin Jackson on 03/10/2022 13:24:33 ?-------------------------------------------------------------------------------- ?HPI Details ?Patient Name: Date of Service: ?[place], [place] 03/10/2022 12:30 PM ?Medical Record Number: [id number] ?Patient Account Number: [id number] ?Date of Birth/Sex: Treating RN: ?December 26, 1960 (61 y.o. [name] ?Primary Care Provider: Velna Hatchet Other Clinician: ?Referring Provider: ?Treating Provider/Extender: Kalman Shan ?Holwerda, Scott ?Weeks in Treatment: 2 ?History of Present Illness ?HPI Description: ADMISSION ?06/01/2019 ?This is a 61 year old man who [place] the [place] and stubbed both of his right great toes at the tip. He had an open wound on both sides the ?left is since healed but the area on the right has been refractory. His wife is been soaking his feet in Epsom salts applying Neosporin and more recently ?somebody told him to apply Vicks to this area and that is what they have been doing. They are frustrated by the lack of healing. They saw their primary ?physician I think via a telehealth visit on June 9 and they were referred here. The patient is not a diabetic he is an ex-smoker perhaps smoking only 1 to 2 ?cigarettes a week when his wife can stop him. ?Past medical history includes bipolar disorder, Parkinsonism/Parkinson's disease., Hypertension, hypothyroidism, COPD and nondiabetic neuropathy ?The patient had arterial studies done also via telehealth. On the left his ABI was 1.07 with triphasic  waveforms. TBI was 1.05. On the right ABI 1.01 with ?triphasic waveforms and a TBI of 1.07 ?06/08/2019; traumatic wounds on the right great toe. This is just about closed using silver collagen. ?7/31; The area on the tip of the right great toe is closed ?READMISSION ?02/24/22 ?This is a now 61 year old man that  we had in clinic during the summer 2020 with bilateral wounds on the tip of his great toes which were traumatic in the setting ?of significant idiopathic peripheral neuropathy. He is not a diabetic. We managed to get these to heal out. ?He is here with a new wound over the tip of his right first toe once again which has been there for about a month. He also has a wound on the tip of his left ?heel which his wife says has been there for about 3 years although we certainly did not identify it during his stay in this clinic in 2020 he has been using ?Betadine I think directed by Dr. Posey Pronto of podiatry who last saw him on 02/07/2022 as well as collagen to the right first toe. In reviewing Dr. Serita Grit note from ?02/07/2022 he was talking about the left heel being a stage I injury that was "clinically healed". His wife says that this will close and then reopen. ?Past medical history essentially unchanged he has anxiety, bipolar disease, hypertension, Parkinson's disease and nondiabetic peripheral neuropathy which is ?severe according to his wife. ?ABIs in our clinic were 0.96 on the right and 0.88 on the left ?4/25; patient presents for follow-up. He has been using Hydrofera Blue to the wound beds without issues. He currently denies signs of infection. ?Electronic Signature(s) ?Signed: 03/10/2022 2:15:15 PM By: Kalman Shan DO ?Entered By: Kalman Shan on 03/10/2022 14:09:58 ?-------------------------------------------------------------------------------- ?Physical Exam Details ?Patient Name: Date of Service: ?[place], [place] 03/10/2022 12:30 PM ?Medical Record Number: [id number] ?Patient Account Number:  [id number] ?Date of Birth/Sex: Treating RN: ?[birth year] (61 y.o. [name] ?Primary Care Provider: Velna Hatchet Other Clinician: ?Referring Provider: ?Treating Provider/Extender: Kalman Shan ?Holwerda, Scott ?Weeks in Treatment: 2 ?Constitutional ?respirations regular, non-labored and within target range for patient.Marland Kitchen ?Cardiovascular ?2+ dorsalis pedis/posterior tibialis pulses. ?Psychiatric ?pleasant and cooperative. ?Notes ?Right foot: T the right great distal toe there is an open wound with granulation tissue throughout. Scant fibrinous tissue present as well. No signs of infection. ?o ?Left foot: T the heel there is an open wound with nonviable tissue throughout. No signs of surrounding infection. ?o ?Electronic Signature(s) ?Signed: 03/10/2022 2:15:15 PM By: Kalman Shan DO ?Entered By: Kalman Shan on 03/10/2022 14:11:14 ?-------------------------------------------------------------------------------- ?Physician Orders Details ?Patient Name: Date of Service: ?[place], [place] 03/10/2022 12:30 PM ?Medical Record Number: [id number] ?Patient Account Number: [id number] ?Date of Birth/Sex: Treating RN: ?1961-10-20 (61 y.o. [name] ?Primary Care Provider: Velna Hatchet Other Clinician: ?Referring Provider: ?Treating Provider/Extender: Kalman Shan ?Holwerda, Scott ?Weeks in Treatment: 2 ?Verbal / Phone Orders: No ?Diagnosis Coding ?ICD-10 Coding ?Code Description ?L97.518 Non-pressure chronic ulcer of other part of right foot with other specified severity ?H43.276 Pressure ulcer of left heel, stage 3 ?G90.09 Other idiopathic peripheral autonomic neuropathy ?G20 Parkinson's disease ?Follow-up Appointments ?ppointment in 1 week. - with Dr. Heber Ferron Leveda Anna, Room 7) ?Return A ?Bathing/ Shower/ Hygiene ?May shower and wash wound with soap and water. - May wash with antibacterial soap when changing dressing. ?Edema Control - Lymphedema / SCD / Other ?Elevate legs to the level of the heart or  above for 30 minutes daily and/or when sitting, a frequency of: - throughout the day ?Avoid standing for long periods of time. ?Moisturize legs daily. ?Off-Loading ?Open toe surgical shoe to: - to bilateral feet ?Other: - Do

## 2022-03-19 ENCOUNTER — Encounter (HOSPITAL_BASED_OUTPATIENT_CLINIC_OR_DEPARTMENT_OTHER): Payer: Medicare Other | Attending: Internal Medicine | Admitting: Internal Medicine

## 2022-03-19 DIAGNOSIS — J449 Chronic obstructive pulmonary disease, unspecified: Secondary | ICD-10-CM | POA: Diagnosis not present

## 2022-03-19 DIAGNOSIS — G20 Parkinson's disease: Secondary | ICD-10-CM | POA: Insufficient documentation

## 2022-03-19 DIAGNOSIS — G9009 Other idiopathic peripheral autonomic neuropathy: Secondary | ICD-10-CM | POA: Diagnosis not present

## 2022-03-19 DIAGNOSIS — Z87891 Personal history of nicotine dependence: Secondary | ICD-10-CM | POA: Insufficient documentation

## 2022-03-19 DIAGNOSIS — E039 Hypothyroidism, unspecified: Secondary | ICD-10-CM | POA: Insufficient documentation

## 2022-03-19 DIAGNOSIS — L97518 Non-pressure chronic ulcer of other part of right foot with other specified severity: Secondary | ICD-10-CM | POA: Insufficient documentation

## 2022-03-19 DIAGNOSIS — I10 Essential (primary) hypertension: Secondary | ICD-10-CM | POA: Diagnosis not present

## 2022-03-19 DIAGNOSIS — G629 Polyneuropathy, unspecified: Secondary | ICD-10-CM | POA: Diagnosis not present

## 2022-03-19 DIAGNOSIS — L89623 Pressure ulcer of left heel, stage 3: Secondary | ICD-10-CM | POA: Insufficient documentation

## 2022-03-19 NOTE — Progress Notes (Signed)
[name], [name] ([id number]) ?Visit Report for 03/19/2022 ?Arrival Information Details ?Patient Name: Date of Service: ?[place], [place] 03/19/2022 12:30 PM ?Medical Record Number: [id number] ?Patient Account Number: [id number] ?Date of Birth/Sex: Treating RN: ?[birth year] (61 y.o. [name], Lauren ?Primary Care Evita Merida: Velna Hatchet Other Clinician: ?Referring Tama Grosz: ?Treating Nardos Putnam/Extender: Kalman Shan ?Holwerda, Scott ?Weeks in Treatment: 3 ?Visit Information History Since Last Visit ?Added or deleted any medications: No ?Patient Arrived: Ambulatory ?Any new allergies or adverse reactions: No ?Arrival Time: 12:54 ?Had a fall or experienced change in No ?Accompanied By: self ?activities of daily living that may affect ?Transfer Assistance: None ?risk of falls: ?Patient Identification Verified: Yes ?Signs or symptoms of abuse/neglect since last visito No ?Secondary Verification Process Completed: Yes ?Hospitalized since last visit: No ?Patient Requires Transmission-Based Precautions: No ?Implantable device outside of the clinic excluding No ?Patient Has Alerts: Yes ?cellular tissue based products placed in the center ?Patient Alerts: Patient on Blood Thinner since last visit: ?Has Dressing in Place as Prescribed: Yes ?Pain Present Now: No ?Electronic Signature(s) ?Signed: 03/19/2022 4:12:14 PM By: Rhae Hammock RN ?Entered By: Rhae Hammock on 03/19/2022 12:54:41 ?-------------------------------------------------------------------------------- ?Encounter Discharge Information Details ?Patient Name: Date of Service: ?[place], [place] 03/19/2022 12:30 PM ?Medical Record Number: [id number] ?Patient Account Number: [id number] ?Date of Birth/Sex: Treating RN: ?20-Dec-1960 (61 y.o. [name], Lauren ?Primary Care Mekaila Tarnow: Velna Hatchet Other Clinician: ?Referring Generoso Cropper: ?Treating Carmesha Morocco/Extender: Kalman Shan ?Holwerda, Scott ?Weeks in Treatment: 3 ?Encounter Discharge Information Items Post Procedure  Vitals ?Discharge Condition: Stable ?Temperature (F): 98.7 ?Ambulatory Status: Ambulatory ?Pulse (bpm): 74 ?Discharge Destination: Home ?Respiratory Rate (breaths/min): 17 ?Transportation: Private Auto ?Blood Pressure (mmHg): 134/74 ?Accompanied By: self ?Schedule Follow-up Appointment: Yes ?Clinical Summary of Care: Patient Declined ?Electronic Signature(s) ?Signed: 03/19/2022 4:12:14 PM By: Rhae Hammock RN ?Entered By: Rhae Hammock on 03/19/2022 13:23:47 ?-------------------------------------------------------------------------------- ?Lower Extremity Assessment Details ?Patient Name: ?Date of Service: ?[place], [place] 03/19/2022 12:30 PM ?Medical Record Number: [id number] ?Patient Account Number: [id number] ?Date of Birth/Sex: ?Treating RN: ?July 14, 1961 (61 y.o. [name], Lauren ?Primary Care Macky Galik: Holwerda, Scott ?Other Clinician: ?Referring Yitzhak Awan: ?Treating Jaclyn Andy/Extender: Kalman Shan ?Holwerda, Scott ?Weeks in Treatment: 3 ?Edema Assessment ?Assessed: [Left: Yes] [Right: Yes] ?Edema: [Left: Yes] [Right: Yes] ?Calf ?Left: Right: ?Point of Measurement: 34 cm From Medial Instep 36.5 cm 35 cm ?Ankle ?Left: Right: ?Point of Measurement: 12 cm From Medial Instep 22.6 cm 24 cm ?Vascular Assessment ?Pulses: ?Dorsalis Pedis ?Palpable: [Left:Yes] [Right:Yes] ?Posterior Tibial ?Palpable: [Left:Yes] [Right:Yes] ?Electronic Signature(s) ?Signed: 03/19/2022 4:12:14 PM By: Rhae Hammock RN ?Entered By: Rhae Hammock on 03/19/2022 12:55:53 ?-------------------------------------------------------------------------------- ?Multi Wound Chart Details ?Patient Name: ?Date of Service: ?[place], [place] 03/19/2022 12:30 PM ?Medical Record Number: [id number] ?Patient Account Number: [id number] ?Date of Birth/Sex: ?Treating RN: ?02-Mar-1961 (61 y.o. M) M) ?Primary Care Marvens Hollars: Holwerda, Scott ?Other Clinician: ?Referring Kayvon Mo: ?Treating Norris Brumbach/Extender: Kalman Shan ?Holwerda, Scott ?Weeks in Treatment:  3 ?Vital Signs ?Height(in): 72 ?Pulse(bpm): 74 ?Weight(lbs): 169 ?Blood Pressure(mmHg): 127/80 ?Body Mass Index(BMI): 22.9 ?Temperature(??F): 98 ?Respiratory Rate(breaths/min): 17 ?Photos: [2:Left Calcaneus] [3:Right T Great oe] [N/A:N/A N/A] ?Wound Location: [2:Pressure Injury] [3:Pressure Injury] [N/A:N/A] ?Wounding Event: [2:Pressure Ulcer] [3:Pressure Ulcer] [N/A:N/A] ?Primary Etiology: [2:Chronic Obstructive Pulmonary] [3:Chronic Obstructive Pulmonary] [N/A:N/A] ?Comorbid History: [2:Disease (COPD), Hypertension, Neuropathy 02/22/2019] [3:Disease (COPD), Hypertension, Neuropathy 01/23/2022] [N/A:N/A] ?Date Acquired: [2:3] [3:3] [N/A:N/A] ?Weeks of Treatment: [2:Open] [3:Open] [N/A:N/A] ?Wound Status: [2:No] [3:No] [N/A:N/A] ?Wound Recurrence: [2:2x2.5x0.1] [3:1x0.9x0.1] [N/A:N/A] ?Measurements L x W x D (cm) [2:3.927] [3:2.951] [N/A:N/A] ?A (cm?) : ?rea [2:0.393] [3:0.071] [N/A:N/A] ?Volume (cm?) : [2:33.30%] [3:-28.50%] [N/A:N/A] ?%  Reduction in A [2:rea: 33.30%] [3:-29.10%] [N/A:N/A] ?% Reduction in Volume: [2:Category/Stage III] [3:Category/Stage III] [N/A:N/A] ?Classification: [2:Medium] [3:Medium] [N/A:N/A] ?Exudate A mount: [2:Serosanguineous] [3:Serosanguineous] [N/A:N/A] ?Exudate Type: [2:red, brown] [3:red, brown] [N/A:N/A] ?Exudate Color: [2:Distinct, outline attached] [3:Distinct, outline attached] [N/A:N/A] ?Wound Margin: [2:Medium (34-66%)] [3:Large (67-100%)] [N/A:N/A] ?Granulation A mount: [2:Red] [3:Red] [N/A:N/A] ?Granulation Quality: [2:Medium (34-66%)] [3:None Present (0%)] [N/A:N/A] ?Necrotic A mount: [2:Eschar] [3:N/A] [N/A:N/A] ?Necrotic Tissue: ?[2:Fat Layer (Subcutaneous Tissue): Yes Fat Layer (Subcutaneous Tissue): Yes N/A] ?Exposed Structures: ?[2:Fascia: No Tendon: No Muscle: No Joint: No Bone: No None] [3:Fascia: No Tendon: No Muscle: No Joint: No Bone: No None] [N/A:N/A] ?Epithelialization: [2:Debridement - Excisional] [3:Debridement - Excisional] [N/A:N/A] ?Debridement: ?Pre-procedure  Verification/Time Out 13:00 [3:13:00] [N/A:N/A] ?Taken: [2:Lidocaine] [3:Lidocaine] [N/A:N/A] ?Pain Control: [2:Subcutaneous, Slough] [3:Subcutaneous, Slough] [N/A:N/A] ?Tissue Debrided: [2:Skin/Subcutaneous Tissue] [3:Skin/Subcutaneous Tissue] [N/A:N/A] ?Level: [2:5] [3:0.9] [N/A:N/A] ?Debridement A (sq cm): [2:rea Curette] [3:Curette] [N/A:N/A] ?Instrument: [2:Minimum] [3:Minimum] [N/A:N/A] ?Bleeding: [2:Pressure] [3:Pressure] [N/A:N/A] ?Hemostasis A chieved: [2:0] [3:0] [N/A:N/A] ?Procedural Pain: [2:0] [3:0] [N/A:N/A] ?Post Procedural Pain: [2:Procedure was tolerated well] [3:Procedure was tolerated well] [N/A:N/A] ?Debridement Treatment Response: [2:2x2.5x0.1] [3:1x0.9x0.1] [N/A:N/A] ?Post Debridement Measurements L x ?W x D (cm) [2:0.393] [3:0.071] [N/A:N/A] ?Post Debridement Volume: (cm?) [2:Category/Stage III] [3:Category/Stage III] [N/A:N/A] ?Post Debridement Stage: [2:Debridement] [3:Debridement] [N/A:N/A] ?Treatment Notes ?Wound #2 (Calcaneus) Wound Laterality: Left ?Cleanser ?Soap and Water ?Discharge Instruction: May shower and wash wound with dial antibacterial soap and water prior to dressing change. ?Peri-Wound Care ?Topical ?Primary Dressing ?Hydrofera Blue Ready Foam, 2.5 x2.5 in ?Discharge Instruction: Apply to wound bed as instructed ?Secondary Dressing ?ALLEVYN Heel 4 1/2in x 5 1/2in / 10.5cm x 13.5cm ?Discharge Instruction: Apply over primary dressing as directed. ?Cosmopor Wound Dressing, 4x4 (in/in) (Border Gauze) ?Discharge Instruction: Apply bordered gauze or foam border over primary dressing. ?Secured With ?Compression Wrap ?Compression Stockings ?Add-Ons ?Wound #3 (Toe Great) Wound Laterality: Right ?Cleanser ?Soap and Water ?Discharge Instruction: May shower and wash wound with dial antibacterial soap and water prior to dressing change. ?Peri-Wound Care ?Topical ?Triple Antibiotic Ointment, 1 (oz) Tube ?Primary Dressing ?Hydrofera Blue Ready Foam, 2.5 x2.5 in ?Discharge Instruction:  Apply to wound bed as instructed ?Secondary Dressing ?Bordered Gauze, 2x2 in ?Discharge Instruction: Apply over primary dressing as directed. ?Secured With ?Compression Wrap ?Compression Stockings ?Add-Ons

## 2022-03-19 NOTE — Progress Notes (Signed)
HARVIR, PATRY ([id number]) ?Visit Report for 03/19/2022 ?Chief Complaint Document Details ?Patient Name: Date of Service: ?[place], [place] 03/19/2022 12:30 PM ?Medical Record Number: [id number] ?Patient Account Number: [id number] ?Date of Birth/Sex: Treating RN: ?May 18, 1961 (61 y.o. M) ?Primary Care Provider: Velna Hatchet Other Clinician: ?Referring Provider: ?Treating Provider/Extender: Kalman Shan ?Holwerda, Scott ?Weeks in Treatment: 3 ?Information Obtained from: Patient ?Chief Complaint ?06/01/2019; patient is here for review of the wound on the tip of his left first toe ?02/24/2022; patient returns to clinic for review of wounds on the tip of his right first toe and left heel ?Electronic Signature(s) ?Signed: 03/19/2022 1:57:04 PM By: Kalman Shan DO ?Entered By: Kalman Shan on 03/19/2022 13:38:39 ?-------------------------------------------------------------------------------- ?Debridement Details ?Patient Name: Date of Service: ?[place], [place] 03/19/2022 12:30 PM ?Medical Record Number: [id number] ?Patient Account Number: [id number] ?Date of Birth/Sex: Treating RN: ?1961/08/19 (61 y.o. [name], Lauren ?Primary Care Provider: Velna Hatchet Other Clinician: ?Referring Provider: ?Treating Provider/Extender: Kalman Shan ?Holwerda, Scott ?Weeks in Treatment: 3 ?Debridement Performed for Assessment: Wound #3 Right T Great ?oe ?Performed By: Physician Kalman Shan, DO ?Debridement Type: Debridement ?Level of Consciousness (Pre-procedure): Awake and Alert ?Pre-procedure Verification/Time Out Yes - 13:00 ?Taken: ?Start Time: 13:00 ?Pain Control: Lidocaine ?T Area Debrided (L x W): ?otal 1 (cm) x 0.9 (cm) = 0.9 (cm?) ?Tissue and other material debrided: Viable, Non-Viable, Slough, Subcutaneous, Slough ?Level: Skin/Subcutaneous Tissue ?Debridement Description: Excisional ?Instrument: Curette ?Bleeding: Minimum ?Hemostasis Achieved: Pressure ?End Time: 13:00 ?Procedural Pain: 0 ?Post Procedural Pain:  0 ?Response to Treatment: Procedure was tolerated well ?Level of Consciousness (Post- Awake and Alert ?procedure): ?Post Debridement Measurements of Total Wound ?Length: (cm) 1 ?Stage: Category/Stage III ?Width: (cm) 0.9 ?Depth: (cm) 0.1 ?Volume: (cm?) 0.071 ?Character of Wound/Ulcer Post Debridement: Improved ?Post Procedure Diagnosis ?Same as Pre-procedure ?Electronic Signature(s) ?Signed: 03/19/2022 1:57:04 PM By: Kalman Shan DO ?Signed: 03/19/2022 4:12:14 PM By: Rhae Hammock RN ?Entered By: Rhae Hammock on 03/19/2022 13:11:38 ?-------------------------------------------------------------------------------- ?Debridement Details ?Patient Name: ?Date of Service: ?[place], [place] 03/19/2022 12:30 PM ?Medical Record Number: [id number] ?Patient Account Number: [id number] ?Date of Birth/Sex: ?Treating RN: ?October 11, 1961 (61 y.o. [name], Lauren ?Primary Care Provider: Holwerda, Scott ?Other Clinician: ?Referring Provider: ?Treating Provider/Extender: Kalman Shan ?Holwerda, Scott ?Weeks in Treatment: 3 ?Debridement Performed for Assessment: Wound #2 Left Calcaneus ?Performed By: Physician Kalman Shan, DO ?Debridement Type: Debridement ?Level of Consciousness (Pre-procedure): Awake and Alert ?Pre-procedure Verification/Time Out Yes - 13:00 ?Taken: ?Start Time: 13:00 ?Pain Control: Lidocaine ?T Area Debrided (L x W): ?otal 2 (cm) x 2.5 (cm) = 5 (cm?) ?Tissue and other material debrided: Viable, Non-Viable, Slough, Subcutaneous, Slough ?Level: Skin/Subcutaneous Tissue ?Debridement Description: Excisional ?Instrument: Curette ?Bleeding: Minimum ?Hemostasis Achieved: Pressure ?End Time: 13:00 ?Procedural Pain: 0 ?Post Procedural Pain: 0 ?Response to Treatment: Procedure was tolerated well ?Level of Consciousness (Post- Awake and Alert ?procedure): ?Post Debridement Measurements of Total Wound ?Length: (cm) 2 ?Stage: Category/Stage III ?Width: (cm) 2.5 ?Depth: (cm) 0.1 ?Volume: (cm?) 0.393 ?Character of  Wound/Ulcer Post Debridement: Improved ?Post Procedure Diagnosis ?Same as Pre-procedure ?Electronic Signature(s) ?Signed: 03/19/2022 1:57:04 PM By: Kalman Shan DO ?Signed: 03/19/2022 4:12:14 PM By: Rhae Hammock RN ?Entered By: Rhae Hammock on 03/19/2022 13:12:07 ?-------------------------------------------------------------------------------- ?HPI Details ?Patient Name: ?Date of Service: ?[place], [place] 03/19/2022 12:30 PM ?Medical Record Number: [id number] ?Patient Account Number: [id number] ?Date of Birth/Sex: Treating RN: ?10-10-1961 (61 y.o. M) ?Primary Care Provider: Other Clinician: ?Holwerda, Scott ?Referring Provider: ?Treating Provider/Extender: Kalman Shan ?Holwerda, Scott ?Weeks in Treatment: 3 ?History of Present Illness ?HPI Description: ADMISSION ?06/01/2019 ?  This is a 61 year old man who [place] and stubbed both of his right great toes at the tip. He had an open wound on both sides the ?left is since healed but the area on the right has been refractory. His wife is been soaking his feet in Epsom salts applying Neosporin and more recently ?somebody told him to apply Vicks to this area and that is what they have been doing. They are frustrated by the lack of healing. They saw their primary ?physician I think via a telehealth visit on June 9 and they were referred here. The patient is not a diabetic he is an ex-smoker perhaps smoking only 1 to 2 ?cigarettes a week when his wife can stop him. ?Past medical history includes bipolar disorder, Parkinsonism/Parkinson's disease., Hypertension, hypothyroidism, COPD and nondiabetic neuropathy ?The patient had arterial studies done also via telehealth. On the left his ABI was 1.07 with triphasic waveforms. TBI was 1.05. On the right ABI 1.01 with ?triphasic waveforms and a TBI of 1.07 ?06/08/2019; traumatic wounds on the right great toe. This is just about closed using silver collagen. ?7/31; The area on the tip of the right  great toe is closed ?READMISSION ?02/24/22 ?This is a now 61 year old man that we had in clinic during the summer 2020 with bilateral wounds on the tip of his great toes which were traumatic in the setting ?of significant idiopathic peripheral neuropathy. He is not a diabetic. We managed to get these to heal out. ?He is here with a new wound over the tip of his right first toe once again which has been there for about a month. He also has a wound on the tip of his left ?heel which his wife says has been there for about 3 years although we certainly did not identify it during his stay in this clinic in 2020 he has been using ?Betadine I think directed by Dr. Posey Pronto of podiatry who last saw him on 02/07/2022 as well as collagen to the right first toe. In reviewing Dr. Serita Grit note from ?02/07/2022 he was talking about the left heel being a stage I injury that was "clinically healed". His wife says that this will close and then reopen. ?Past medical history essentially unchanged he has anxiety, bipolar disease, hypertension, Parkinson's disease and nondiabetic peripheral neuropathy which is ?severe according to his wife. ?ABIs in our clinic were 0.96 on the right and 0.88 on the left ?4/25; patient presents for follow-up. He has been using Hydrofera Blue to the wound beds without issues. He currently denies signs of infection. ?5/4; patient presents for follow-up. He has been using Hydrofera Blue to the wound beds. He has no issues or complaints today. ?Electronic Signature(s) ?Signed: 03/19/2022 1:57:04 PM By: Kalman Shan DO ?Entered By: Kalman Shan on 03/19/2022 13:39:02 ?-------------------------------------------------------------------------------- ?Physical Exam Details ?Patient Name: Date of Service: ?[place], [place] 03/19/2022 12:30 PM ?Medical Record Number: [id number] ?Patient Account Number: [id number] ?Date of Birth/Sex: Treating RN: ?1961/05/26 (61 y.o. M) ?Primary Care Provider: Velna Hatchet Other  Clinician: ?Referring Provider: ?Treating Provider/Extender: Kalman Shan ?Holwerda, Scott ?Weeks in Treatment: 3 ?Constitutional ?respirations regular, non-labored and within target range for patient.Marland Kitchen ?[facility]

## 2022-03-26 ENCOUNTER — Encounter (HOSPITAL_BASED_OUTPATIENT_CLINIC_OR_DEPARTMENT_OTHER): Payer: Medicare Other | Admitting: Internal Medicine

## 2022-03-26 DIAGNOSIS — G9009 Other idiopathic peripheral autonomic neuropathy: Secondary | ICD-10-CM

## 2022-03-26 DIAGNOSIS — G20 Parkinson's disease: Secondary | ICD-10-CM

## 2022-03-26 DIAGNOSIS — L89623 Pressure ulcer of left heel, stage 3: Secondary | ICD-10-CM

## 2022-03-26 DIAGNOSIS — L97518 Non-pressure chronic ulcer of other part of right foot with other specified severity: Secondary | ICD-10-CM

## 2022-03-26 NOTE — Progress Notes (Signed)
[name], [name] ([id number]) ?Visit Report for 03/26/2022 ?Arrival Information Details ?Patient Name: Date of Service: ?[place], [place] 03/26/2022 10:15 A M ?Medical Record Number: [id number] ?Patient Account Number: [id number] ?Date of Birth/Sex: Treating RN: ?1961/01/12 (61 y.o. [name] ?Primary Care Deaire Mcwhirter: Velna Hatchet Other Clinician: ?Referring Everlyn Farabaugh: ?Treating Jailen Coward/Extender: Kalman Shan ?Holwerda, Scott ?Weeks in Treatment: 4 ?Visit Information History Since Last Visit ?Added or deleted any medications: No ?Patient Arrived: Ambulatory ?Any new allergies or adverse reactions: No ?Arrival Time: 10:21 ?Had a fall or experienced change in No ?Accompanied By: wife ?activities of daily living that may affect ?Transfer Assistance: None ?risk of falls: ?Patient Identification Verified: Yes ?Signs or symptoms of abuse/neglect since last visito No ?Secondary Verification Process Completed: Yes ?Hospitalized since last visit: No ?Patient Requires Transmission-Based Precautions: No ?Implantable device outside of the clinic excluding No ?Patient Has Alerts: Yes ?cellular tissue based products placed in the center ?Patient Alerts: Patient on Blood Thinner since last visit: ?Has Dressing in Place as Prescribed: Yes ?Has Footwear/Offloading in Place as Prescribed: No ?Pain Present Now: No ?Electronic Signature(s) ?Signed: 03/26/2022 4:36:14 PM By: Lorrin Jackson ?Entered By: Lorrin Jackson on 03/26/2022 10:25:10 ?-------------------------------------------------------------------------------- ?Clinic Level of Care Assessment Details ?Patient Name: Date of Service: ?[place], [place] 03/26/2022 10:15 A M ?Medical Record Number: [id number] ?Patient Account Number: [id number] ?Date of Birth/Sex: Treating RN: ?1961/07/26 (61 y.o. [name] ?Primary Care Auguste Tebbetts: Velna Hatchet Other Clinician: ?Referring Tinleigh Whitmire: ?Treating Manoah Deckard/Extender: Kalman Shan ?Holwerda, Scott ?Weeks in Treatment: 4 ?Clinic  Level of Care Assessment Items ?TOOL 4 Quantity Score ?X- 1 0 ?Use when only an EandM is performed on FOLLOW-UP visit ?ASSESSMENTS - Nursing Assessment / Reassessment ?X- 1 10 ?Reassessment of Co-morbidities (includes updates in patient status) ?X- 1 5 ?Reassessment of Adherence to Treatment Plan ?ASSESSMENTS - Wound and Skin A ssessment / Reassessment ?'[]'$  - 0 ?Simple Wound Assessment / Reassessment - one wound ?X- 2 5 ?Complex Wound Assessment / Reassessment - multiple wounds ?'[]'$  - 0 ?Dermatologic / Skin Assessment (not related to wound area) ?ASSESSMENTS - Focused Assessment ?'[]'$  - 0 ?Circumferential Edema Measurements - multi extremities ?'[]'$  - 0 ?Nutritional Assessment / Counseling / Intervention ?'[]'$  - 0 ?Lower Extremity Assessment (monofilament, tuning fork, pulses) ?'[]'$  - 0 ?Peripheral Arterial Disease Assessment (using hand held doppler) ?ASSESSMENTS - Ostomy and/or Continence Assessment and Care ?'[]'$  - 0 ?Incontinence Assessment and Management ?'[]'$  - 0 ?Ostomy Care Assessment and Management (repouching, etc.) ?PROCESS - Coordination of Care ?'[]'$  - 0 ?Simple Patient / Family Education for ongoing care ?X- 1 20 ?Complex (extensive) Patient / Family Education for ongoing care ?'[]'$  - 0 ?Staff obtains Consents, Records, T Results / Process Orders ?est ?'[]'$  - 0 ?Staff telephones [place], Nursing Homes / Clarify orders / etc ?'[]'$  - 0 ?Routine Transfer to another Facility (non-emergent condition) ?'[]'$  - 0 ?Routine Hospital Admission (non-emergent condition) ?'[]'$  - 0 ?New Admissions / Biomedical engineer / Ordering NPWT Apligraf, etc. ?, ?'[]'$  - 0 ?Emergency Hospital Admission (emergent condition) ?'[]'$  - 0 ?Simple Discharge Coordination ?'[]'$  - 0 ?Complex (extensive) Discharge Coordination ?PROCESS - Special Needs ?'[]'$  - 0 ?Pediatric / Minor Patient Management ?'[]'$  - 0 ?Isolation Patient Management ?'[]'$  - 0 ?Hearing / Language / Visual special needs ?'[]'$  - 0 ?Assessment of Community assistance (transportation, D/C planning, etc.) ?'[]'$  -  0 ?Additional assistance / Altered mentation ?'[]'$  - 0 ?Support Surface(s) Assessment (bed, cushion, seat, etc.) ?INTERVENTIONS - Wound Cleansing / Measurement ?'[]'$  - 0 ?Simple Wound Cleansing - one wound ?X- 2  5 ?Complex Wound Cleansing - multiple wounds ?X- 1 5 ?Wound Imaging (photographs - any number of wounds) ?'[]'$  - 0 ?Wound Tracing (instead of photographs) ?'[]'$  - 0 ?Simple Wound Measurement - one wound ?X- 2 5 ?Complex Wound Measurement - multiple wounds ?INTERVENTIONS - Wound Dressings ?X - Small Wound Dressing one or multiple wounds 2 10 ?'[]'$  - 0 ?Medium Wound Dressing one or multiple wounds ?'[]'$  - 0 ?Large Wound Dressing one or multiple wounds ?'[]'$  - 0 ?Application of Medications - topical ?'[]'$  - 0 ?Application of Medications - injection ?INTERVENTIONS - Miscellaneous ?'[]'$  - 0 ?External ear exam ?'[]'$  - 0 ?Specimen Collection (cultures, biopsies, blood, body fluids, etc.) ?'[]'$  - 0 ?Specimen(s) / Culture(s) sent or taken to Lab for analysis ?'[]'$  - 0 ?Patient Transfer (multiple staff / Civil Service fast streamer / Similar devices) ?'[]'$  - 0 ?Simple Staple / Suture removal (25 or less) ?'[]'$  - 0 ?Complex Staple / Suture removal (26 or more) ?'[]'$  - 0 ?Hypo / Hyperglycemic Management (close monitor of Blood Glucose) ?'[]'$  - 0 ?Ankle / Brachial Index (ABI) - do not check if billed separately ?X- 1 5 ?Vital Signs ?Has the patient been seen at the hospital within the last three years: Yes ?Total Score: 95 ?Level Of Care: New/Established - Level 3 ?Electronic Signature(s) ?Signed: 03/26/2022 4:36:14 PM By: Lorrin Jackson ?Entered By: Lorrin Jackson on 03/26/2022 11:00:36 ?-------------------------------------------------------------------------------- ?Encounter Discharge Information Details ?Patient Name: Date of Service: ?[place], [place] 03/26/2022 10:15 A M ?Medical Record Number: [id number] ?Patient Account Number: [id number] ?Date of Birth/Sex: Treating RN: ?[birth year] (61 y.o. [name] ?Primary Care Lella Mullany: Velna Hatchet Other  Clinician: ?Referring Kirt Chew: ?Treating Araminta Zorn/Extender: Kalman Shan ?Holwerda, Scott ?Weeks in Treatment: 4 ?Encounter Discharge Information Items ?Discharge Condition: Stable ?Ambulatory Status: Ambulatory ?Discharge Destination: Home ?Transportation: Private Auto ?Accompanied By: wife ?Schedule Follow-up Appointment: Yes ?Clinical Summary of Care: Provided on 03/26/2022 ?Form Type Recipient ?Paper Patient Patient ?Electronic Signature(s) ?Signed: 03/26/2022 4:36:14 PM By: Lorrin Jackson ?Entered By: Lorrin Jackson on 03/26/2022 11:10:58 ?-------------------------------------------------------------------------------- ?Lower Extremity Assessment Details ?Patient Name: Date of Service: ?[place], [place] 03/26/2022 10:15 A M ?Medical Record Number: [id number] ?Patient Account Number: [id number] ?Date of Birth/Sex: Treating RN: ?25-Dec-1960 (61 y.o. [name] ?Primary Care Mariyanna Mucha: Velna Hatchet Other Clinician: ?Referring Emmalyn Hinson: ?Treating Leslie Jester/Extender: Kalman Shan ?Holwerda, Scott ?Weeks in Treatment: 4 ?Edema Assessment ?Assessed: [Left: Yes] [Right: Yes] ?Edema: [Left: Yes] [Right: Yes] ?Calf ?Left: Right: ?Point of Measurement: 34 cm From Medial Instep 36 cm 35 cm ?Ankle ?Left: Right: ?Point of Measurement: 12 cm From Medial Instep 23 cm 23 cm ?Vascular Assessment ?Pulses: ?Dorsalis Pedis ?Palpable: [Left:Yes] [Right:Yes] ?Electronic Signature(s) ?Signed: 03/26/2022 4:36:14 PM By: Lorrin Jackson ?Entered By: Lorrin Jackson on 03/26/2022 10:29:59 ?-------------------------------------------------------------------------------- ?Multi Wound Chart Details ?Patient Name: ?Date of Service: ?[place], [place] 03/26/2022 10:15 A M ?Medical Record Number: [id number] ?Patient Account Number: [id number] ?Date of Birth/Sex: ?Treating RN: ?1961-03-17 (61 y.o. [name] ?Primary Care Cecily Lawhorne: Holwerda, Scott ?Other Clinician: ?Referring Maddalena Linarez: ?Treating Sendy Pluta/Extender: Kalman Shan ?Holwerda, Scott ?Weeks in Treatment: 4 ?Vital Signs ?Height(in): 72 ?Pulse(bpm): 77 ?Weight(lbs): 169 ?Blood Pressure(mmHg): 128/79 ?Body Mass Index(BMI): 22.9 ?Temperature(??F): 97.9 ?Respiratory Rate(breaths/min): 18 ?P

## 2022-03-26 NOTE — Progress Notes (Signed)
WILLOUGHBY, DOELL ([id number]) ?Visit Report for 03/26/2022 ?Chief Complaint Document Details ?Patient Name: Date of Service: ?[place], [place] 03/26/2022 10:15 A M ?Medical Record Number: [id number] ?Patient Account Number: [id number] ?Date of Birth/Sex: Treating RN: ?04-22-1961 (61 y.o. [name] ?Primary Care Provider: Velna Chavez Other Clinician: ?Referring Provider: ?Treating Provider/Extender: [name] ?[name] ?Weeks in Treatment: 4 ?Information Obtained from: Patient ?Chief Complaint ?06/01/2019; patient is here for review of the wound on the tip of his left first toe ?02/24/2022; patient returns to clinic for review of wounds on the tip of his right first toe and left heel ?Electronic Signature(s) ?Signed: 03/26/2022 12:56:45 PM By: Jimmy Shan DO ?Entered By: [name] on 03/26/2022 12:39:33 ?-------------------------------------------------------------------------------- ?HPI Details ?Patient Name: Date of Service: ?[place], [place] 03/26/2022 10:15 A M ?Medical Record Number: [id number] ?Patient Account Number: [id number] ?Date of Birth/Sex: Treating RN: ?1961/01/30 (61 y.o. [name] ?Primary Care Provider: Velna Chavez Other Clinician: ?Referring Provider: ?Treating Provider/Extender: [name] ?[name] ?Weeks in Treatment: 4 ?History of Present Illness ?HPI Description: ADMISSION ?06/01/2019 ?This is a 61 year old man who [place] and stubbed both of his right great toes at the tip. He had an open wound on both sides the ?left is since healed but the area on the right has been refractory. His wife is been soaking his feet in Epsom salts applying Neosporin and more recently ?somebody told him to apply Vicks to this area and that is what they have been doing. They are frustrated by the lack of healing. They saw their primary ?physician I think via a telehealth visit on June 9 and they were referred here. The patient is not a  diabetic he is an ex-smoker perhaps smoking only 1 to 2 ?cigarettes a week when his wife can stop him. ?Past medical history includes bipolar disorder, Parkinsonism/Parkinson's disease., Hypertension, hypothyroidism, COPD and nondiabetic neuropathy ?The patient had arterial studies done also via telehealth. On the left his ABI was 1.07 with triphasic waveforms. TBI was 1.05. On the right ABI 1.01 with ?triphasic waveforms and a TBI of 1.07 ?06/08/2019; traumatic wounds on the right great toe. This is just about closed using silver collagen. ?7/31; The area on the tip of the right great toe is closed ?READMISSION ?02/24/22 ?This is a now 61 year old man that we had in clinic during the summer 2020 with bilateral wounds on the tip of his great toes which were traumatic in the setting ?of significant idiopathic peripheral neuropathy. He is not a diabetic. We managed to get these to heal out. ?He is here with a new wound over the tip of his right first toe once again which has been there for about a month. He also has a wound on the tip of his left ?heel which his wife says has been there for about 3 years although we certainly did not identify it during his stay in this clinic in 2020 he has been using ?Betadine I think directed by Dr. Posey Pronto of podiatry who last saw him on 02/07/2022 as well as collagen to the right first toe. In reviewing Dr. Serita Grit note from ?02/07/2022 he was talking about the left heel being a stage I injury that was "clinically healed". His wife says that this will close and then reopen. ?Past medical history essentially unchanged he has anxiety, bipolar disease, hypertension, Parkinson's disease and nondiabetic peripheral neuropathy which is ?severe according to his wife. ?ABIs in our clinic were 0.96 on the right and  0.88 on the left ?4/25; patient presents for follow-up. He has been using Hydrofera Blue to the wound beds without issues. He currently denies signs of infection. ?5/4; patient  presents for follow-up. He has been using Hydrofera Blue to the wound beds. He has no issues or complaints today. ?5/11; patient presents for follow-up. He has been using Hydrofera Blue and gentamicin to the toe wound and hydrofera blue to the heel wound bed. ?Electronic Signature(s) ?Signed: 03/26/2022 12:56:45 PM By: Jimmy Shan DO ?Entered By: [name] on 03/26/2022 12:41:21 ?-------------------------------------------------------------------------------- ?Physical Exam Details ?Patient Name: Date of Service: ?[place], [place] 03/26/2022 10:15 A M ?Medical Record Number: [id number] ?Patient Account Number: [id number] ?Date of Birth/Sex: Treating RN: ?May 10, 1961 (61 y.o. [name] ?Primary Care Provider: Velna Chavez Other Clinician: ?Referring Provider: ?Treating Provider/Extender: [name] ?[name] ?Weeks in Treatment: 4 ?Constitutional ?respirations regular, non-labored and within target range for patient.Marland Kitchen ?Cardiovascular ?2+ dorsalis pedis/posterior tibialis pulses. ?Psychiatric ?pleasant and cooperative. ?Notes ?Right foot: T the distal right great toe there is a small open wound with granulation tissue present. ?o ?Left foot: T the heel there is a small open wound with granulation tissue at the base. ?o ?No surrounding signs of infection to any wound bed ?Electronic Signature(s) ?Signed: 03/26/2022 12:56:45 PM By: Jimmy Shan DO ?Entered By: [name] on 03/26/2022 12:44:31 ?-------------------------------------------------------------------------------- ?Physician Orders Details ?Patient Name: Date of Service: ?[place], [place] 03/26/2022 10:15 A M ?Medical Record Number: [id number] ?Patient Account Number: [id number] ?Date of Birth/Sex: Treating RN: ?11-Nov-1961 (61 y.o. [name] ?Primary Care Provider: Velna Chavez Other Clinician: ?Referring Provider: ?Treating Provider/Extender: [name] ?[name] ?Weeks in Treatment: 4 ?Verbal / Phone  Orders: No ?Diagnosis Coding ?ICD-10 Coding ?Code Description ?L97.518 Non-pressure chronic ulcer of other part of right foot with other specified severity ?I29.798 Pressure ulcer of left heel, stage 3 ?G90.09 Other idiopathic peripheral autonomic neuropathy ?G20 Parkinson's disease ?Follow-up Appointments ?ppointment in 2 weeks. - 04/07/22 @ 12:30p with Dr. Heber [place] and Leveda Anna, Room 7 ?Return A ?Bathing/ Shower/ Hygiene ?May shower and wash wound with soap and water. - May wash with antibacterial soap when changing dressing. ?Edema Control - Lymphedema / SCD / Other ?Elevate legs to the level of the heart or above for 30 minutes daily and/or when sitting, a frequency of: - throughout the day ?Avoid standing for long periods of time. ?Moisturize legs daily. ?Off-Loading ?Open toe surgical shoe to: - to bilateral feet ?Other: - Do not wear crocs-Keep pressure off heel ?Wound Treatment ?Wound #2 - Calcaneus Wound Laterality: Left ?Cleanser: Soap and Water Every Other Day/30 Days ?Discharge Instructions: May shower and wash wound with dial antibacterial soap and water prior to dressing change. ?Secondary Dressing: ALLEVYN Heel 4 1/2in x 5 1/2in / 10.5cm x 13.5cm Every Other Day/30 Days ?Discharge Instructions: Apply over primary dressing as directed. ?Secondary Dressing: Cosmopor Wound Dressing, 4x4 (in/in) (Border Gauze) (Generic) Every Other Day/30 Days ?Discharge Instructions: Apply bordered gauze or foam border over primary dressing. ?Wound #3 - T Great ?oe Wound Laterality: Right ?Cleanser: Soap and Water Every Other Day/30 Days ?Discharge Instructions: May shower and wash wound with dial antibacterial soap and water prior to dressing change. ?Topical: Triple Antibiotic Ointment, 1 (oz) Tube Every Other Day/30 Days ?Secondary Dressing: Bordered Gauze, 2x2 in (Generic) Every Other Day/30 Days ?Discharge Instructions: Apply over primary dressing as directed. ?Electronic Signature(s) ?Signed: 03/26/2022 12:56:45 PM By:  Jimmy Shan DO ?Entered By: [name] on 03/26/2022 12:44:39 ?-------------------------------------------------------------------------------- ?Problem List Details ?Patient Name: ?  Date of Service: ?[place] [address]

## 2022-04-07 ENCOUNTER — Encounter (HOSPITAL_BASED_OUTPATIENT_CLINIC_OR_DEPARTMENT_OTHER): Payer: Medicare Other | Admitting: Internal Medicine

## 2022-04-07 DIAGNOSIS — L97518 Non-pressure chronic ulcer of other part of right foot with other specified severity: Secondary | ICD-10-CM

## 2022-04-07 DIAGNOSIS — L89623 Pressure ulcer of left heel, stage 3: Secondary | ICD-10-CM

## 2022-04-07 DIAGNOSIS — G20 Parkinson's disease: Secondary | ICD-10-CM | POA: Diagnosis not present

## 2022-04-07 DIAGNOSIS — G9009 Other idiopathic peripheral autonomic neuropathy: Secondary | ICD-10-CM | POA: Diagnosis not present

## 2022-04-07 NOTE — Progress Notes (Signed)
AZURE, BARRALES ([id number]) Visit Report for 04/07/2022 Chief Complaint Document Details Patient Name: Date of Service: DEMONTE, DOBRATZ 04/07/2022 12:30 PM Medical Record Number: [id number] Patient Account Number: [id number] Date of Birth/Sex: Treating RN: 1961-07-08 (61 y.o. [name] Primary Care Provider: Velna Hatchet Other Clinician: Referring Provider: Treating Provider/Extender: Alcus Dad Weeks in Treatment: 6 Information Obtained from: Patient Chief Complaint 06/01/2019; patient is here for review of the wound on the tip of his left first toe 02/24/2022; patient returns to clinic for review of wounds on the tip of his right first toe and left heel Electronic Signature(s) Signed: 04/07/2022 1:34:44 PM By: Kalman Shan DO Entered By: Kalman Shan on 04/07/2022 13:31:18 -------------------------------------------------------------------------------- HPI Details Patient Name: Date of Service: DILLA RD, [place] 04/07/2022 12:30 PM Medical Record Number: [id number] Patient Account Number: [id number] Date of Birth/Sex: Treating RN: 1961/11/16 (61 y.o. [name] Primary Care Provider: Velna Hatchet Other Clinician: Referring Provider: Treating Provider/Extender: Alcus Dad Weeks in Treatment: 6 History of Present Illness HPI Description: ADMISSION 06/01/2019 This is a 61 year old man who [place] and stubbed both of his right great toes at the tip. He had an open wound on both sides the left is since healed but the area on the right has been refractory. His wife is been soaking his feet in Epsom salts applying Neosporin and more recently somebody told him to apply Vicks to this area and that is what they have been doing. They are frustrated by the lack of healing. They saw their primary physician I think via a telehealth visit on June 9 and they were referred here. The patient is not a diabetic  he is an ex-smoker perhaps smoking only 1 to 2 cigarettes a week when his wife can stop him. Past medical history includes bipolar disorder, Parkinsonism/Parkinson's disease., Hypertension, hypothyroidism, COPD and nondiabetic neuropathy The patient had arterial studies done also via telehealth. On the left his ABI was 1.07 with triphasic waveforms. TBI was 1.05. On the right ABI 1.01 with triphasic waveforms and a TBI of 1.07 06/08/2019; traumatic wounds on the right great toe. This is just about closed using silver collagen. 7/31; The area on the tip of the right great toe is closed READMISSION 02/24/22 This is a now 61 year old man that we had in clinic during the summer 2020 with bilateral wounds on the tip of his great toes which were traumatic in the setting of significant idiopathic peripheral neuropathy. He is not a diabetic. We managed to get these to heal out. He is here with a new wound over the tip of his right first toe once again which has been there for about a month. He also has a wound on the tip of his left heel which his wife says has been there for about 3 years although we certainly did not identify it during his stay in this clinic in 2020 he has been using Betadine I think directed by Dr. Posey Pronto of podiatry who last saw him on 02/07/2022 as well as collagen to the right first toe. In reviewing Dr. Serita Grit note from 02/07/2022 he was talking about the left heel being a stage I injury that was "clinically healed". His wife says that this will close and then reopen. Past medical history essentially unchanged he has anxiety, bipolar disease, hypertension, Parkinson's disease and nondiabetic peripheral neuropathy which is severe according to his wife. ABIs in our clinic were 0.96 on the right and 0.88 on  the left 4/25; patient presents for follow-up. He has been using Hydrofera Blue to the wound beds without issues. He currently denies signs of infection. 5/4; patient presents for  follow-up. He has been using Hydrofera Blue to the wound beds. He has no issues or complaints today. 5/11; patient presents for follow-up. He has been using Hydrofera Blue and gentamicin to the toe wound and hydrofera blue to the heel wound bed. 5/23; patient presents for follow-up. He has been using antibiotic ointment to the wound beds. He reports improvement in wound healing. Electronic Signature(s) Signed: 04/07/2022 1:34:44 PM By: Kalman Shan DO Entered By: Kalman Shan on 04/07/2022 13:31:57 -------------------------------------------------------------------------------- Physical Exam Details Patient Name: Date of Service: DILLA RD, [place] 04/07/2022 12:30 PM Medical Record Number: [id number] Patient Account Number: [id number] Date of Birth/Sex: Treating RN: [birth year] (62 y.o. [name] Primary Care Provider: Velna Hatchet Other Clinician: Referring Provider: Treating Provider/Extender: Alcus Dad Weeks in Treatment: 6 Constitutional respirations regular, non-labored and within target range for patient.. Cardiovascular 2+ dorsalis pedis/posterior tibialis pulses. Psychiatric pleasant and cooperative. Notes Right foot: T the distal right great toe there is a scab present. It is firm and not boggy. No drainage noted. o Left foot: T the heel there is epithelization to the previous wound sites. o Electronic Signature(s) Signed: 04/07/2022 1:34:44 PM By: Kalman Shan DO Entered By: Kalman Shan on 04/07/2022 13:32:37 -------------------------------------------------------------------------------- Physician Orders Details Patient Name: Date of Service: DILLA RD, [place] 04/07/2022 12:30 PM Medical Record Number: [id number] Patient Account Number: [id number] Date of Birth/Sex: Treating RN: 1961/06/13 (61 y.o. [name] Primary Care Provider: Velna Hatchet Other Clinician: Referring Provider: Treating Provider/Extender: Alcus Dad Weeks in Treatment: 6 Verbal / Phone Orders: No Diagnosis Coding ICD-10 Coding Code Description L97.518 Non-pressure chronic ulcer of other part of right foot with other specified severity L89.623 Pressure ulcer of left heel, stage 3 G90.09 Other idiopathic peripheral autonomic neuropathy G20 Parkinson's disease Discharge From [facility] Services Discharge from [place] - No further follow up needed, call if any changes. Off-Loading Other: - Continue to wear open toe shoes. Non Wound Condition pply the following to affected area as directed: - Apply Vaseline to newly healed areas. A Electronic Signature(s) Signed: 04/07/2022 1:34:44 PM By: Kalman Shan DO Entered By: Kalman Shan on 04/07/2022 13:32:44 -------------------------------------------------------------------------------- Problem List Details Patient Name: Date of Service: DILLA RD, [place] 04/07/2022 12:30 PM Medical Record Number: [id number] Patient Account Number: [id number] Date of Birth/Sex: Treating RN: 10-Feb-1961 (61 y.o. [name] Primary Care Provider: Velna Hatchet Other Clinician: Referring Provider: Treating Provider/Extender: Alcus Dad Weeks in Treatment: 6 Active Problems ICD-10 Encounter Code Description Active Date MDM Diagnosis L97.518 Non-pressure chronic ulcer of other part of right foot with other specified 02/24/2022 No Yes severity L89.623 Pressure ulcer of left heel, stage 3 02/24/2022 No Yes G90.09 Other idiopathic peripheral autonomic neuropathy 02/24/2022 No Yes G20 Parkinson's disease 02/24/2022 No Yes Inactive Problems Resolved Problems Electronic Signature(s) Signed: 04/07/2022 1:34:44 PM By: Kalman Shan DO Entered By: Kalman Shan on 04/07/2022 13:30:57 -------------------------------------------------------------------------------- Progress Note Details Patient Name: Date of Service: DILLA RD, [place] 04/07/2022  12:30 PM Medical Record Number: [id number] Patient Account Number: [id number] Date of Birth/Sex: Treating RN: [birth year] (61 y.o. [name] Primary Care Provider: Velna Hatchet Other Clinician: Referring Provider: Treating Provider/Extender: Alcus Dad Weeks in Treatment: 6 Subjective Chief Complaint Information obtained from Patient 06/01/2019; patient is here for review of the wound on the tip of  his left first toe 02/24/2022; patient returns to clinic for review of wounds on the tip of his right first toe and left heel History of Present Illness (HPI) ADMISSION 06/01/2019 This is a 61 year old man who apparently was at the beach in [place] and stubbed both of his right great toes at the tip. He had an open wound on both sides the left is since healed but the area on the right has been refractory. His wife is been soaking his feet in Epsom salts applying Neosporin and more recently somebody told him to apply Vicks to this area and that is what they have been doing. They are frustrated by the lack of healing. They saw their primary physician I think via a telehealth visit on June 9 and they were referred here. The patient is not a diabetic he is an ex-smoker perhaps smoking only 1 to 2 cigarettes a week when his wife can stop him. Past medical history includes bipolar disorder, Parkinsonism/Parkinson's disease., Hypertension, hypothyroidism, COPD and nondiabetic neuropathy The patient had arterial studies done also via telehealth. On the left his ABI was 1.07 with triphasic waveforms. TBI was 1.05. On the right ABI 1.01 with triphasic waveforms and a TBI of 1.07 06/08/2019; traumatic wounds on the right great toe. This is just about closed using silver collagen. 7/31; The area on the tip of the right great toe is closed READMISSION 02/24/22 This is a now 60 year old man that we had in clinic during the summer 2020 with bilateral wounds on the tip of his great toes  which were traumatic in the setting of significant idiopathic peripheral neuropathy. He is not a diabetic. We managed to get these to heal out. He is here with a new wound over the tip of his right first toe once again which has been there for about a month. He also has a wound on the tip of his left heel which his wife says has been there for about 3 years although we certainly did not identify it during his stay in this clinic in 2020 he has been using Betadine I think directed by Dr. Posey Pronto of podiatry who last saw him on 02/07/2022 as well as collagen to the right first toe. In reviewing Dr. Serita Grit note from 02/07/2022 he was talking about the left heel being a stage I injury that was "clinically healed". His wife says that this will close and then reopen. Past medical history essentially unchanged he has anxiety, bipolar disease, hypertension, Parkinson's disease and nondiabetic peripheral neuropathy which is severe according to his wife. ABIs in our clinic were 0.96 on the right and 0.88 on the left 4/25; patient presents for follow-up. He has been using Hydrofera Blue to the wound beds without issues. He currently denies signs of infection. 5/4; patient presents for follow-up. He has been using Hydrofera Blue to the wound beds. He has no issues or complaints today. 5/11; patient presents for follow-up. He has been using Hydrofera Blue and gentamicin to the toe wound and hydrofera blue to the heel wound bed. 5/23; patient presents for follow-up. He has been using antibiotic ointment to the wound beds. He reports improvement in wound healing. Patient History Unable to Obtain Patient History due to Dementia. Information obtained from Patient. Family History Hypertension - Father,Mother, No family history of Cancer, Diabetes, Heart Disease, Hereditary Spherocytosis, Kidney Disease, Lung Disease, Seizures, Stroke, Thyroid Problems. Social History Former smoker, Marital Status - Married, Alcohol  Use - Never, Drug Use - No History, Caffeine  Use - Never. Medical History Eyes Denies history of Cataracts, Glaucoma, Optic Neuritis Ear/Nose/Mouth/Throat Denies history of Chronic sinus problems/congestion, Middle ear problems Hematologic/Lymphatic Denies history of Anemia, Hemophilia, Human Immunodeficiency Virus, Lymphedema, Sickle Cell Disease Respiratory Patient has history of Chronic Obstructive Pulmonary Disease (COPD) Denies history of Aspiration, Asthma, Pneumothorax, Sleep Apnea, Tuberculosis Cardiovascular Patient has history of Hypertension Denies history of Angina, Arrhythmia, Congestive Heart Failure, Coronary Artery Disease, Deep Vein Thrombosis, Hypotension, Myocardial Infarction, Peripheral Arterial Disease, Peripheral Venous Disease, Phlebitis, Vasculitis Gastrointestinal Denies history of Cirrhosis , Colitis, Crohnoos, Hepatitis A, Hepatitis B, Hepatitis C Endocrine Denies history of Type I Diabetes, Type II Diabetes Genitourinary Denies history of End Stage Renal Disease Immunological Denies history of Lupus Erythematosus, Raynaudoos, Scleroderma Integumentary (Skin) Denies history of History of Burn Musculoskeletal Denies history of Gout, Rheumatoid Arthritis, Osteoarthritis, Osteomyelitis Neurologic Patient has history of Neuropathy Denies history of Dementia, Quadriplegia, Paraplegia, Seizure Disorder Oncologic Denies history of Received Chemotherapy, Received Radiation Psychiatric Denies history of Anorexia/bulimia, Confinement Anxiety Medical A Surgical History Notes nd Cardiovascular High Cholesterol Gastrointestinal Chronic constipation Endocrine Hypothyroidism Neurologic Parkinson's Psychiatric Bipolar, Depression Objective Constitutional respirations regular, non-labored and within target range for patient.. Vitals Time Taken: 12:50 PM, Height: 72 in, Weight: 169 lbs, BMI: 22.9, Temperature: 98.4 F, Pulse: 76 bpm, Respiratory Rate: 18  breaths/min, Blood Pressure: 123/76 mmHg. Cardiovascular 2+ dorsalis pedis/posterior tibialis pulses. Psychiatric pleasant and cooperative. General Notes: Right foot: T the distal right great toe there is a scab present. It is firm and not boggy. No drainage noted. Left foot: T the heel there is o o epithelization to the previous wound sites. Integumentary (Hair, Skin) Wound #2 status is Healed - Epithelialized. Original cause of wound was Pressure Injury. The date acquired was: 02/22/2019. The wound has been in treatment 6 weeks. The wound is located on the Left Calcaneus. The wound measures 0cm length x 0cm width x 0cm depth; 0cm^2 area and 0cm^3 volume. Wound #3 status is Healed - Epithelialized. Original cause of wound was Pressure Injury. The date acquired was: 01/23/2022. The wound has been in treatment 6 weeks. The wound is located on the Right T Great. The wound measures 0cm length x 0cm width x 0cm depth; 0cm^2 area and 0cm^3 volume. oe Assessment Active Problems ICD-10 Non-pressure chronic ulcer of other part of right foot with other specified severity Pressure ulcer of left heel, stage 3 Other idiopathic peripheral autonomic neuropathy Parkinson's disease Patient has done well with antibiotic ointment. The right great toe wound is scabbed over. It appears well-healing. I recommended Vaseline nightly until the scab comes off. The left heel has epithelization to the previous wound site. I again recommended Vaseline nightly. He may follow-up as needed. Plan Discharge From [facility] Services: Discharge from [place] - No further follow up needed, call if any changes. Off-Loading: Other: - Continue to wear open toe shoes. Non Wound Condition: Apply the following to affected area as directed: - Apply Vaseline to newly healed areas. 1. Vaseline nightly 2. Discharge from clinic due to closed wound 3. Follow-up as needed Electronic Signature(s) Signed: 04/07/2022 1:34:44 PM By:  Kalman Shan DO Entered By: Kalman Shan on 04/07/2022 13:34:11 -------------------------------------------------------------------------------- HxROS Details Patient Name: Date of Service: DILLA RD, [place] 04/07/2022 12:30 PM Medical Record Number: [id number] Patient Account Number: [id number] Date of Birth/Sex: Treating RN: July 25, 1961 (61 y.o. [name] Primary Care Provider: Velna Hatchet Other Clinician: Referring Provider: Treating Provider/Extender: Alcus Dad Weeks in Treatment: 6 Unable to Obtain Patient  History due to Dementia Information Obtained From Patient Eyes Medical History: Negative for: Cataracts; Glaucoma; Optic Neuritis Ear/Nose/Mouth/Throat Medical History: Negative for: Chronic sinus problems/congestion; Middle ear problems Hematologic/Lymphatic Medical History: Negative for: Anemia; Hemophilia; Human Immunodeficiency Virus; Lymphedema; Sickle Cell Disease Respiratory Medical History: Positive for: Chronic Obstructive Pulmonary Disease (COPD) Negative for: Aspiration; Asthma; Pneumothorax; Sleep Apnea; Tuberculosis Cardiovascular Medical History: Positive for: Hypertension Negative for: Angina; Arrhythmia; Congestive Heart Failure; Coronary Artery Disease; Deep Vein Thrombosis; Hypotension; Myocardial Infarction; Peripheral Arterial Disease; Peripheral Venous Disease; Phlebitis; Vasculitis Past Medical History Notes: High Cholesterol Gastrointestinal Medical History: Negative for: Cirrhosis ; Colitis; Crohns; Hepatitis A; Hepatitis B; Hepatitis C Past Medical History Notes: Chronic constipation Endocrine Medical History: Negative for: Type I Diabetes; Type II Diabetes Past Medical History Notes: Hypothyroidism Genitourinary Medical History: Negative for: End Stage Renal Disease Immunological Medical History: Negative for: Lupus Erythematosus; Raynauds; Scleroderma Integumentary (Skin) Medical  History: Negative for: History of Burn Musculoskeletal Medical History: Negative for: Gout; Rheumatoid Arthritis; Osteoarthritis; Osteomyelitis Neurologic Medical History: Positive for: Neuropathy Negative for: Dementia; Quadriplegia; Paraplegia; Seizure Disorder Past Medical History Notes: Parkinson's Oncologic Medical History: Negative for: Received Chemotherapy; Received Radiation Psychiatric Medical History: Negative for: Anorexia/bulimia; Confinement Anxiety Past Medical History Notes: Bipolar, Depression Immunizations Pneumococcal Vaccine: Received Pneumococcal Vaccination: Yes Received Pneumococcal Vaccination On or After 60th Birthday: No Implantable Devices No devices added Family and Social History Cancer: No; Diabetes: No; Heart Disease: No; Hereditary Spherocytosis: No; Hypertension: Yes - Father,Mother; Kidney Disease: No; Lung Disease: No; Seizures: No; Stroke: No; Thyroid Problems: No; Former smoker; Marital Status - Married; Alcohol Use: Never; Drug Use: No History; Caffeine Use: Never; Financial Concerns: No; Food, Clothing or Shelter Needs: No; Support System Lacking: No; Transportation Concerns: No Electronic Signature(s) Signed: 04/07/2022 1:34:44 PM By: Kalman Shan DO Signed: 04/07/2022 4:09:03 PM By: Lorrin Jackson Entered By: Kalman Shan on 04/07/2022 13:32:03 -------------------------------------------------------------------------------- SuperBill Details Patient Name: Date of Service: Anna Genre RD, [place] 04/07/2022 Medical Record Number: [id number] Patient Account Number: [id number] Date of Birth/Sex: Treating RN: Jul 01, 1961 (61 y.o. [name] Primary Care Provider: Velna Hatchet Other Clinician: Referring Provider: Treating Provider/Extender: Alcus Dad Weeks in Treatment: 6 Diagnosis Coding ICD-10 Codes Code Description [phone] Non-pressure chronic ulcer of other part of right foot with other specified  severity L89.623 Pressure ulcer of left heel, stage 3 G90.09 Other idiopathic peripheral autonomic neuropathy G20 Parkinson's disease Facility Procedures CPT4 Code: [id number] Description: [phone] - WOUND CARE VISIT-LEV 2 EST PT Modifier: Quantity: 1 Physician Procedures : CPT4 Code Description Modifier [id number] - WC PHYS LEVEL 3 - EST PT ICD-10 Diagnosis Description L97.518 Non-pressure chronic ulcer of other part of right foot with other specified severity L89.623 Pressure ulcer of left heel, stage 3 G90.09 Other  idiopathic peripheral autonomic neuropathy G20 Parkinson's disease Quantity: 1 Electronic Signature(s) Signed: 04/07/2022 1:34:44 PM By: Kalman Shan DO Entered By: Kalman Shan on 04/07/2022 13:34:25

## 2022-04-07 NOTE — Progress Notes (Signed)
[name], [name] ([id number]) Visit Report for 04/07/2022 Arrival Information Details Patient Name: Date of Service: [name], [name] 04/07/2022 12:30 PM Medical Record Number: [id number] Patient Account Number: [id number] Date of Birth/Sex: Treating RN: Aug 14, 1961 (61 y.o. [name] Primary Care [name]: Velna Hatchet Other Clinician: Referring [name]: Treating Analie Katzman/Extender: [name] in Treatment: 6 Visit Information History Since Last Visit Added or deleted any medications: No Patient Arrived: Ambulatory Any new allergies or adverse reactions: No Arrival Time: 12:48 Had a fall or experienced change in No Accompanied By: wife activities of daily living that may affect Transfer Assistance: None risk of falls: Patient Identification Verified: Yes Signs or symptoms of abuse/neglect since last visito No Secondary Verification Process Completed: Yes Hospitalized since last visit: No Patient Requires Transmission-Based Precautions: No Implantable device outside of the clinic excluding No Patient Has Alerts: Yes cellular tissue based products placed in the center Patient Alerts: Patient on Blood Thinner since last visit: Has Dressing in Place as Prescribed: Yes Pain Present Now: No Electronic Signature(s) Signed: 04/07/2022 4:09:03 PM By: [name] Entered By: [name] on 04/07/2022 12:50:10 -------------------------------------------------------------------------------- Clinic Level of Care Assessment Details Patient Name: Date of Service: [name] 04/07/2022 12:30 PM Medical Record Number: [id number] Patient Account Number: [id number] Date of Birth/Sex: Treating RN: 1961/04/02 (61 y.o. [name] Primary Care [name]: Velna Hatchet Other Clinician: Referring [name]: Treating [name]/Extender: [name] in Treatment: 6 Clinic Level of Care Assessment Items TOOL 4 Quantity  Score X- 1 0 Use when only an EandM is performed on FOLLOW-UP visit ASSESSMENTS - Nursing Assessment / Reassessment '[]'$  - 0 Reassessment of Co-morbidities (includes updates in patient status) '[]'$  - 0 Reassessment of Adherence to Treatment Plan ASSESSMENTS - Wound and Skin A ssessment / Reassessment '[]'$  - 0 Simple Wound Assessment / Reassessment - one wound '[]'$  - 0 Complex Wound Assessment / Reassessment - multiple wounds '[]'$  - 0 Dermatologic / Skin Assessment (not related to wound area) ASSESSMENTS - Focused Assessment '[]'$  - 0 Circumferential Edema Measurements - multi extremities '[]'$  - 0 Nutritional Assessment / Counseling / Intervention '[]'$  - 0 Lower Extremity Assessment (monofilament, tuning fork, pulses) '[]'$  - 0 Peripheral Arterial Disease Assessment (using hand held doppler) ASSESSMENTS - Ostomy and/or Continence Assessment and Care '[]'$  - 0 Incontinence Assessment and Management '[]'$  - 0 Ostomy Care Assessment and Management (repouching, etc.) PROCESS - Coordination of Care X - Simple Patient / Family Education for ongoing care 1 15 '[]'$  - 0 Complex (extensive) Patient / Family Education for ongoing care X- 1 10 Staff obtains Programmer, systems, Records, T Results / Process Orders est '[]'$  - 0 Staff telephones HHA, Nursing Homes / Clarify orders / etc '[]'$  - 0 Routine Transfer to another Facility (non-emergent condition) '[]'$  - 0 Routine Hospital Admission (non-emergent condition) '[]'$  - 0 New Admissions / Biomedical engineer / Ordering NPWT Apligraf, etc. , '[]'$  - 0 Emergency Hospital Admission (emergent condition) X- 1 10 Simple Discharge Coordination '[]'$  - 0 Complex (extensive) Discharge Coordination PROCESS - Special Needs '[]'$  - 0 Pediatric / Minor Patient Management '[]'$  - 0 Isolation Patient Management '[]'$  - 0 Hearing / Language / Visual special needs '[]'$  - 0 Assessment of Community assistance (transportation, D/C planning, etc.) '[]'$  - 0 Additional assistance / Altered  mentation '[]'$  - 0 Support Surface(s) Assessment (bed, cushion, seat, etc.) INTERVENTIONS - Wound Cleansing / Measurement '[]'$  - 0 Simple Wound Cleansing - one wound '[]'$  - 0 Complex Wound Cleansing - multiple wounds X-  1 5 Wound Imaging (photographs - any number of wounds) '[]'$  - 0 Wound Tracing (instead of photographs) '[]'$  - 0 Simple Wound Measurement - one wound '[]'$  - 0 Complex Wound Measurement - multiple wounds INTERVENTIONS - Wound Dressings '[]'$  - 0 Small Wound Dressing one or multiple wounds '[]'$  - 0 Medium Wound Dressing one or multiple wounds '[]'$  - 0 Large Wound Dressing one or multiple wounds '[]'$  - 0 Application of Medications - topical '[]'$  - 0 Application of Medications - injection INTERVENTIONS - Miscellaneous '[]'$  - 0 External ear exam '[]'$  - 0 Specimen Collection (cultures, biopsies, blood, body fluids, etc.) '[]'$  - 0 Specimen(s) / Culture(s) sent or taken to Lab for analysis '[]'$  - 0 Patient Transfer (multiple staff / Civil Service fast streamer / Similar devices) '[]'$  - 0 Simple Staple / Suture removal (25 or less) '[]'$  - 0 Complex Staple / Suture removal (26 or more) '[]'$  - 0 Hypo / Hyperglycemic Management (close monitor of Blood Glucose) '[]'$  - 0 Ankle / Brachial Index (ABI) - do not check if billed separately X- 1 5 Vital Signs Has the patient been seen at the hospital within the last three years: Yes Total Score: 45 Level Of Care: New/Established - Level 2 Electronic Signature(s) Signed: 04/07/2022 4:09:03 PM By: [name] Entered By: [name] on 04/07/2022 13:16:34 -------------------------------------------------------------------------------- Encounter Discharge Information Details Patient Name: Date of Service: [name], [name] 04/07/2022 12:30 PM Medical Record Number: [id number] Patient Account Number: [id number] Date of Birth/Sex: Treating RN: 07-15-1961 (61 y.o. [name] Primary Care Nazario Russom: Velna Hatchet Other Clinician: Referring Rayli Wiederhold: Treating  Terria Deschepper/Extender: [name] in Treatment: 6 Encounter Discharge Information Items Discharge Condition: Stable Ambulatory Status: Ambulatory Discharge Destination: Home Transportation: Private Auto Accompanied By: spouse Schedule Follow-up Appointment: No Clinical Summary of Care: Provided on 04/07/2022 Form Type Recipient Paper Patient Patient Electronic Signature(s) Signed: 04/07/2022 4:09:03 PM By: [name] Entered By: [name] on 04/07/2022 13:25:34 -------------------------------------------------------------------------------- Lower Extremity Assessment Details Patient Name: Date of Service: [name], [name] 04/07/2022 12:30 PM Medical Record Number: [id number] Patient Account Number: [id number] Date of Birth/Sex: Treating RN: 12-28-1960 (60 y.o. [name] Primary Care Twylah Bennetts: Velna Hatchet Other Clinician: Referring Iasha Mccalister: Treating Aprille Sawhney/Extender: Alcus Dad Weeks in Treatment: 6 Edema Assessment Assessed: [Left: Yes] [Right: Yes] Edema: [Left: Yes] [Right: Yes] Calf Left: Right: Point of Measurement: 34 cm From Medial Instep 36 cm 35 cm Ankle Left: Right: Point of Measurement: 12 cm From Medial Instep 23 cm 23 cm Vascular Assessment Pulses: Dorsalis Pedis Palpable: [Left:Yes] [Right:Yes] Electronic Signature(s) Signed: 04/07/2022 4:09:03 PM By: [name] Entered By: [name] on 04/07/2022 12:52:17 -------------------------------------------------------------------------------- Multi Wound Chart Details Patient Name: Date of Service: [name], [name] 04/07/2022 12:30 PM Medical Record Number: [id number] Patient Account Number: [id number] Date of Birth/Sex: Treating RN: 1961-07-17 (61 y.o. [name] Primary Care Ayona Yniguez: Velna Hatchet Other Clinician: Referring Obi Scrima: Treating Caedyn Tassinari/Extender: Alcus Dad Weeks in Treatment: 6 Vital  Signs Height(in): 72 Pulse(bpm): 34 Weight(lbs): 169 Blood Pressure(mmHg): 123/76 Body Mass Index(BMI): 22.9 Temperature(F): 98.4 Respiratory Rate(breaths/min): 18 Photos: [N/A:N/A] Left Calcaneus Right T Great oe N/A Wound Location: Pressure Injury Pressure Injury N/A Wounding Event: Pressure Ulcer Pressure Ulcer N/A Primary Etiology: Chronic Obstructive Pulmonary Chronic Obstructive Pulmonary N/A Comorbid History: Disease (COPD), Hypertension, Disease (COPD), Hypertension, Neuropathy Neuropathy 02/22/2019 01/23/2022 N/A Date Acquired: 6 6 N/A Weeks of Treatment: Healed - Epithelialized Healed - Epithelialized N/A Wound Status: No No N/A Wound Recurrence: 0x0x0 0x0x0 N/A Measurements L  x W x D (cm) 0 0 N/A A (cm) : rea 0 0 N/A Volume (cm) : 100.00% 100.00% N/A % Reduction in Area: 100.00% 100.00% N/A % Reduction in Volume: Category/Stage III Category/Stage III N/A Classification: Treatment Notes Electronic Signature(s) Signed: 04/07/2022 1:34:44 PM By: Kalman Shan DO Signed: 04/07/2022 4:09:03 PM By: [name] Entered By: Kalman Shan on 04/07/2022 13:31:11 -------------------------------------------------------------------------------- Multi-Disciplinary Care Plan Details Patient Name: Date of Service: [name], [name] 04/07/2022 12:30 PM Medical Record Number: [id number] Patient Account Number: [id number] Date of Birth/Sex: Treating RN: 1961/10/22 (61 y.o. [name] Primary Care Daniell Mancinas: Velna Hatchet Other Clinician: Referring Nanda Bittick: Treating Laressa Bolinger/Extender: Alcus Dad Weeks in Treatment: 6 Active Inactive Electronic Signature(s) Signed: 04/07/2022 4:09:03 PM By: [name] Entered By: [name] on 04/07/2022 13:18:28 -------------------------------------------------------------------------------- Pain Assessment Details Patient Name: Date of Service: [name], [name] 04/07/2022 12:30  PM Medical Record Number: [id number] Patient Account Number: [id number] Date of Birth/Sex: Treating RN: December 31, 1960 (61 y.o. [name] Primary Care Willma Obando: Velna Hatchet Other Clinician: Referring Martika Egler: Treating Kawika Bischoff/Extender: Alcus Dad Weeks in Treatment: 6 Active Problems Location of Pain Severity and Description of Pain Patient Has Paino No Site Locations Pain Management and Medication Current Pain Management: Electronic Signature(s) Signed: 04/07/2022 4:09:03 PM By: [name] Entered By: [name] on 04/07/2022 12:51:46 -------------------------------------------------------------------------------- Patient/Caregiver Education Details Patient Name: Date of Service: [name], [name] 5/23/2023andnbsp12:30 PM Medical Record Number: [id number] Patient Account Number: [id number] Date of Birth/Gender: Treating RN: 09-Jul-1961 (61 y.o. [name] Primary Care Physician: Velna Hatchet Other Clinician: Referring Physician: Treating Physician/Extender: [name] in Treatment: 6 Education Assessment Education Provided To: Patient Education Topics Provided Wound/Skin Impairment: Methods: Explain/Verbal, Printed Responses: State content correctly Electronic Signature(s) Signed: 04/07/2022 4:09:03 PM By: [name] Entered By: [name] on 04/07/2022 13:18:42 -------------------------------------------------------------------------------- Wound Assessment Details Patient Name: Date of Service: [name], [name] 04/07/2022 12:30 PM Medical Record Number: [id number] Patient Account Number: [id number] Date of Birth/Sex: Treating RN: [birth year] (61 y.o. [name] Primary Care Kamya Watling: Velna Hatchet Other Clinician: Referring Sanaia Jasso: Treating Joniece Smotherman/Extender: Alcus Dad Weeks in Treatment: 6 Wound Status Wound Number: 2 Primary Pressure  Ulcer Etiology: Wound Location: Left Calcaneus Wound Status: Healed - Epithelialized Wounding Event: Pressure Injury Comorbid Chronic Obstructive Pulmonary Disease (COPD), Date Acquired: 02/22/2019 History: Hypertension, Neuropathy Weeks Of Treatment: 6 Clustered Wound: No Photos Wound Measurements Length: (cm) Width: (cm) Depth: (cm) Area: (cm) Volume: (cm) 0 % Reduction in Area: 100% 0 % Reduction in Volume: 100% 0 0 0 Wound Description Classification: Category/Stage III Electronic Signature(s) Signed: 04/07/2022 4:09:03 PM By: [name] Entered By: [name] on 04/07/2022 13:14:06 -------------------------------------------------------------------------------- Wound Assessment Details Patient Name: Date of Service: [name], [name] 04/07/2022 12:30 PM Medical Record Number: [id number] Patient Account Number: [id number] Date of Birth/Sex: Treating RN: Jun 29, 1961 (61 y.o. [name] Primary Care Brittnae Aschenbrenner: Velna Hatchet Other Clinician: Referring Alonso Gapinski: Treating Maezie Justin/Extender: Alcus Dad Weeks in Treatment: 6 Wound Status Wound Number: 3 Primary Pressure Ulcer Etiology: Wound Location: Right T Great oe Wound Status: Healed - Epithelialized Wounding Event: Pressure Injury Comorbid Chronic Obstructive Pulmonary Disease (COPD), Date Acquired: 01/23/2022 History: Hypertension, Neuropathy Weeks Of Treatment: 6 Clustered Wound: No Photos Wound Measurements Length: (cm) Width: (cm) Depth: (cm) Area: (cm) Volume: (cm) 0 % Reduction in Area: 100% 0 % Reduction in Volume: 100% 0 0 0 Wound Description Classification: Category/Stage III Electronic Signature(s) Signed: 04/07/2022 4:09:03 PM By: [name] Entered By: Onnie Boer,  Jodi on 04/07/2022 13:14:26 -------------------------------------------------------------------------------- Vitals Details Patient Name: Date of Service: [name], [name] 04/07/2022 12:30  PM Medical Record Number: [id number] Patient Account Number: [id number] Date of Birth/Sex: Treating RN: 1961-07-27 (61 y.o. [name] Primary Care Holland Nickson: Velna Hatchet Other Clinician: Referring Daziah Hesler: Treating Ashyr Hedgepath/Extender: Alcus Dad Weeks in Treatment: 6 Vital Signs Time Taken: 12:50 Temperature (F): 98.4 Height (in): 72 Pulse (bpm): 76 Weight (lbs): 169 Respiratory Rate (breaths/min): 18 Body Mass Index (BMI): 22.9 Blood Pressure (mmHg): 123/76 Reference Range: 80 - 120 mg / dl Electronic Signature(s) Signed: 04/07/2022 4:09:03 PM By: [name] Entered By: [name] on 04/07/2022 12:51:40

## 2022-07-08 ENCOUNTER — Encounter: Payer: Self-pay | Admitting: Physician Assistant

## 2022-07-08 ENCOUNTER — Ambulatory Visit (INDEPENDENT_AMBULATORY_CARE_PROVIDER_SITE_OTHER): Payer: Medicare Other | Admitting: Physician Assistant

## 2022-07-08 DIAGNOSIS — R400 Somnolence: Secondary | ICD-10-CM | POA: Diagnosis not present

## 2022-07-08 DIAGNOSIS — F319 Bipolar disorder, unspecified: Secondary | ICD-10-CM | POA: Diagnosis not present

## 2022-07-08 DIAGNOSIS — F411 Generalized anxiety disorder: Secondary | ICD-10-CM | POA: Diagnosis not present

## 2022-07-08 DIAGNOSIS — G20 Parkinson's disease: Secondary | ICD-10-CM

## 2022-07-08 NOTE — Progress Notes (Signed)
[facility] Med Check  Patient ID: [name],  MRN: [id number]  PCP: Jimmy Hatchet, MD  Date of Evaluation: 07/08/2022 Time spent:30 minutes  Chief Complaint:  Chief Complaint   Anxiety; Depression; Follow-up    HISTORY/CURRENT STATUS: HPI For routine med check. Wife [name] is with him.  [name] is doing about the same. Has enjoyed seeing his 75 month old granddtr occas.  Energy and motivation are low, but no different.  No extreme sadness, tearfulness, or feelings of hopelessness.  Sleeps a lot.  States he's lazy and smiles.  ADLs are decreased due to Parkinsons.   Denies any changes in concentration, making decisions, or remembering things.  Appetite has not changed.  Weight is stable.  Denies suicidal or homicidal thoughts.  Continues to have generalized anxiety, will treated with the Xanax.  [name] states he does not have panic attacks.  Patient denies increased energy with decreased need for sleep, increased talkativeness, racing thoughts, impulsivity or risky behaviors, increased spending, increased libido, grandiosity, increased irritability or anger, paranoia, and no hallucinations.  Denies sore throat, earache, cough or cold symptoms, chest pain, palpitations, or shortness of breath.  No abdominal pain, nausea, vomiting, constipation, or diarrhea.  No genitourinary symptoms.  Denies dizziness, syncope, seizures, numbness, tingling, tremor, tics, or confusion.  Still has the wound on left heel.  Sees wound care for that.  Followed by neurology for Parkinson's.  Individual Medical History/ Review of Systems: Changes? :No     Past medications for mental health diagnoses include: Lithium, Risperdal, Xanax, Synthroid, Latuda, trazodone, Depakote, Lamictal  Allergies: Codeine and Lamisil [terbinafine hcl]  Current Medications:  Current Outpatient Medications:    ALPRAZolam (XANAX) 1 MG tablet, TAKE ONE TABLET BY MOUTH THREE TIMES A DAY AS NEEDED FOR ANXIETY, Disp: 90 tablet,  Rfl: 5   amLODipine (NORVASC) 5 MG tablet, Take 5 mg by mouth daily., Disp: , Rfl:    aspirin 81 MG EC tablet, Take 81 mg by mouth daily. , Disp: , Rfl:    atorvastatin (LIPITOR) 20 MG tablet, Take 20 mg by mouth at bedtime., Disp: , Rfl:    carbidopa-levodopa (SINEMET IR) 25-100 MG tablet, Take 1.5 tablets by mouth 3 (three) times daily., Disp: 410 tablet, Rfl: 3   Cholecalciferol (VITAMIN D3) 1000 units CAPS, Take 1,000 Units by mouth daily., Disp: , Rfl:    gabapentin (NEURONTIN) 100 MG capsule, Take 100 mg by mouth at bedtime. , Disp: , Rfl:    levothyroxine (SYNTHROID) 75 MCG tablet, TAKE ONE TABLET BY MOUTH EVERY MORNING, Disp: 90 tablet, Rfl: 1   lithium carbonate 300 MG capsule, TAKE TWO CAPSULES BY MOUTH EVERY NIGHT AT BEDTIME, Disp: 180 capsule, Rfl: 1   Polyethyl Glycol-Propyl Glycol (SYSTANE OP), Place 1 drop into both eyes 4 (four) times daily as needed (dry eyes)., Disp: , Rfl:    polyethylene glycol (MIRALAX / GLYCOLAX) 17 g packet, Take 17 g by mouth daily., Disp: , Rfl:    risperidone (RISPERDAL) 4 MG tablet, TAKE 1 TABLET BY MOUTH AT BEDTIME, Disp: 90 tablet, Rfl: 1   Glycopyrrolate 1 MG/5ML SOLN, Take 2.5 mLs (0.5 mg total) by mouth 2 (two) times daily as needed. (Patient not taking: Reported on 07/09/2021), Disp: 500 mL, Rfl: 6 Medication Side Effects: none  Family Medical/ Social History: Changes?  See HPI  MENTAL HEALTH EXAM:  There were no vitals taken for this visit.There is no height or weight on file to calculate BMI.  General Appearance: Casual, Neat and Well Groomed  Eye Contact:  Good  Speech:  Clear and Coherent and Slow due to PD  Volume:  Normal  Mood:  Euthymic  Affect:  Flat   Thought Process:  Goal Directed and Descriptions of Associations: Intact  Orientation:  Full (Time, Place, and Person)  Thought Content: Logical   Suicidal Thoughts:  No  Homicidal Thoughts:  No  Memory:  WNL Difficult to assess though  Judgement:  Good  Insight:  Good   Psychomotor Activity:  Decreased and Shuffling Gait  Concentration:  Concentration: Good  Recall:  Good  Fund of Knowledge: Good  Language: Good  Assets:  Desire for Improvement  ADL's:  Impaired  Cognition: WNL  Prognosis:  Fair   Labs  01/08/2022 Lithium 0.5  from [place] drawn 10/14/2021 TSH 2.46 PSA 0.8 Vitamin D 46 CBC White count 9.8, hemoglobin 14.1, hematocrit 41.3, platelets 210 Lipid panel cholesterol 153, triglycerides 99, HDL 35, LDL 98 CMP creatinine 1.1 BUN 11, electrolytes and LFTs normal, glucose 100  no lithium level.  DIAGNOSES:    ICD-10-CM   1. Bipolar I disorder ([place])  F31.9     2. Generalized anxiety disorder  F41.1     3. Somnolence  R40.0     4. Parkinson's disease ([place])  G20       Receiving Psychotherapy: No    RECOMMENDATIONS: PDMP was reviewed.  Last Xanax filled 05/20/2022.  I provided 30 minutes of face to face time during this encounter, including time spent before and after the visit in records review, medical decision making, counseling pertinent to today's visit, and charting.   Doing well as far as mental health medications go so no changes will be made. Order for routine labs given to him to have drawn by PCP at his physical in the next few months.  Continue Xanax 1 mg, 1/2-1 3 times daily as needed. Continue gabapentin 100 mg 1 nightly. Continue Synthroid 75 mcg q am. Continue lithium 300 mg, 2 p.o. nightly. Continue Risperdal 4 mg nightly. Return in 6 months.  Donnal Moat, PA-C

## 2022-07-25 ENCOUNTER — Other Ambulatory Visit: Payer: Self-pay | Admitting: Physician Assistant

## 2022-08-08 ENCOUNTER — Other Ambulatory Visit: Payer: Self-pay | Admitting: Physician Assistant

## 2022-09-02 ENCOUNTER — Other Ambulatory Visit: Payer: Self-pay | Admitting: Physician Assistant

## 2022-11-19 ENCOUNTER — Encounter: Payer: Self-pay | Admitting: Internal Medicine

## 2022-11-19 ENCOUNTER — Other Ambulatory Visit: Payer: Self-pay | Admitting: Internal Medicine

## 2022-11-19 DIAGNOSIS — Z720 Tobacco use: Secondary | ICD-10-CM

## 2023-01-11 ENCOUNTER — Ambulatory Visit: Payer: Medicare Other | Admitting: Physician Assistant

## 2023-01-22 ENCOUNTER — Other Ambulatory Visit: Payer: Self-pay | Admitting: Physician Assistant

## 2023-02-02 ENCOUNTER — Ambulatory Visit (INDEPENDENT_AMBULATORY_CARE_PROVIDER_SITE_OTHER): Payer: Medicare Other | Admitting: Physician Assistant

## 2023-02-02 ENCOUNTER — Encounter: Payer: Self-pay | Admitting: Physician Assistant

## 2023-02-02 DIAGNOSIS — F411 Generalized anxiety disorder: Secondary | ICD-10-CM | POA: Diagnosis not present

## 2023-02-02 DIAGNOSIS — F319 Bipolar disorder, unspecified: Secondary | ICD-10-CM

## 2023-02-02 DIAGNOSIS — R400 Somnolence: Secondary | ICD-10-CM

## 2023-02-02 DIAGNOSIS — E039 Hypothyroidism, unspecified: Secondary | ICD-10-CM

## 2023-02-02 NOTE — Progress Notes (Signed)
[facility]  Patient ID: [name],  MRN: [id number]  PCP: Velna Hatchet, MD  Date of Evaluation: 02/02/2023 Time spent:30 minutes  Chief Complaint:  Chief Complaint   Follow-up    HISTORY/CURRENT STATUS: HPI For routine med check. Wife [name] is with him.  Mental health is stable on current meds. He's limited with ADLs d/t Parkinsons.  He does not really doing anything except watch TV.  No extreme sadness, tearfulness, or feelings of hopelessness.  Sleeps a lot but that's no different.  Denies any changes in concentration, making decisions, or remembering things.  Appetite has not changed.  Weight is stable.  Denies suicidal or homicidal thoughts.  [name] reports no racing thoughts, increased talkativeness, no hallucinations.  Anxiety is controlled with the Xanax.  She does not always give him 3/day unless he absolutely needs it.  Sometimes she will give him 1/2 pill also.  Review of Systems  Constitutional: Negative.   HENT: Negative.    Eyes: Negative.   Respiratory: Negative.    Cardiovascular: Negative.   Gastrointestinal: Negative.   Genitourinary: Negative.   Musculoskeletal:        Stiffness due to Parkinson's  Skin:        See HPI and records on chart  Neurological:        Has Parkinson's  Endo/Heme/Allergies: Negative.   Psychiatric/Behavioral:         See HPI   Individual Medical History/ Review of Systems: Changes? :Yes   still has wound on left leg. Sees wound care  Past medications for mental health diagnoses include: Lithium, Risperdal, Xanax, Synthroid, Latuda, trazodone, Depakote, Lamictal  Allergies: Codeine and Lamisil [terbinafine hcl]  Current Medications:  Current Outpatient Medications:    ALPRAZolam (XANAX) 1 MG tablet, TAKE ONE TABLET BY MOUTH THREE TIMES A DAY AS NEEDED FOR ANXIETY, Disp: 90 tablet, Rfl: 5   amLODipine (NORVASC) 5 MG tablet, Take 5 mg by mouth daily., Disp: , Rfl:    aspirin 81 MG EC tablet, Take 81 mg by  mouth daily. , Disp: , Rfl:    atorvastatin (LIPITOR) 20 MG tablet, Take 20 mg by mouth at bedtime., Disp: , Rfl:    carbidopa-levodopa (SINEMET IR) 25-100 MG tablet, Take 1.5 tablets by mouth 3 (three) times daily., Disp: 410 tablet, Rfl: 3   Cholecalciferol (VITAMIN D3) 1000 units CAPS, Take 1,000 Units by mouth daily., Disp: , Rfl:    gabapentin (NEURONTIN) 100 MG capsule, Take 100 mg by mouth at bedtime. , Disp: , Rfl:    levothyroxine (SYNTHROID) 75 MCG tablet, TAKE ONE TABLET BY MOUTH EVERY MORNING, Disp: 90 tablet, Rfl: 1   lithium carbonate 300 MG capsule, TAKE TWO CAPSULES BY MOUTH EVERY NIGHT AT BEDTIME, Disp: 180 capsule, Rfl: 0   Polyethyl Glycol-Propyl Glycol (SYSTANE OP), Place 1 drop into both eyes 4 (four) times daily as needed (dry eyes)., Disp: , Rfl:    polyethylene glycol (MIRALAX / GLYCOLAX) 17 g packet, Take 17 g by mouth daily., Disp: , Rfl:    risperidone (RISPERDAL) 4 MG tablet, TAKE 1 TABLET BY MOUTH AT BEDTIME, Disp: 90 tablet, Rfl: 0   Glycopyrrolate 1 MG/5ML SOLN, Take 2.5 mLs (0.5 mg total) by mouth 2 (two) times daily as needed. (Patient not taking: Reported on 07/09/2021), Disp: 500 mL, Rfl: 6 Medication Side Effects: none  Family Medical/ Social History: Changes?  See HPI  MENTAL HEALTH EXAM:  There were no vitals taken for this visit.There is no height or weight on  file to calculate BMI.  General Appearance: Casual, Neat and Well Groomed  Eye Contact:  Good  Speech:  Clear and Coherent and Slow due to PD  Volume:  Normal  Mood:  Euthymic  Affect:  Flat   Thought Process:  Goal Directed and Descriptions of Associations: Intact  Orientation:  Full (Time, Place, and Person)  Thought Content: Logical   Suicidal Thoughts:  No  Homicidal Thoughts:  No  Memory:  WNL Difficult to assess though  Judgement:  Good  Insight:  Good  Psychomotor Activity:  Decreased and Shuffling Gait  Concentration:  Concentration: Good  Recall:  Good  Fund of Knowledge: Good   Language: Good  Assets:  Desire for Improvement Financial Resources/Insurance Housing Transportation  ADL's:  Impaired  Cognition: WNL  Prognosis:  Fair   Labs  10/19/2022 TSH 2.52 Lithium 0.6 A1C 5.2 CMP Glu 110, BUN/Cr nl, all else nl Lipids  TC 166, Trig 87 LDL 109, HDL 40  DIAGNOSES:    ICD-10-CM   1. Bipolar I disorder ([place])  F31.9     2. Generalized anxiety disorder  F41.1     3. Somnolence  R40.0     4. Hypothyroidism, unspecified type  E03.9      Receiving Psychotherapy: No   RECOMMENDATIONS: PDMP was reviewed.  Last Xanax filled 12/13/2022. I provided 30 minutes of face to face time during this encounter, including time spent before and after the visit in records review, medical decision making, counseling pertinent to today's visit, and charting.   He's stable with current meds so no changes needed.   Continue Xanax 1 mg, 1/2-1 3 times daily as needed. Continue gabapentin 100 mg 1 nightly. Continue Synthroid 75 mcg q am. Continue lithium 300 mg, 2 p.o. nightly. Continue Risperdal 4 mg nightly. Return in 6 months.  Donnal Moat, PA-C

## 2023-02-06 ENCOUNTER — Other Ambulatory Visit: Payer: Self-pay | Admitting: Physician Assistant

## 2023-02-27 ENCOUNTER — Other Ambulatory Visit: Payer: Self-pay | Admitting: Neurology

## 2023-03-01 ENCOUNTER — Other Ambulatory Visit: Payer: Self-pay | Admitting: Physician Assistant

## 2023-03-01 ENCOUNTER — Telehealth: Payer: Self-pay | Admitting: Physician Assistant

## 2023-03-01 MED ORDER — ALPRAZOLAM 1 MG PO TABS: 1.0000 mg | ORAL_TABLET | Freq: Three times a day (TID) | ORAL | 5 refills | Status: DC | PRN

## 2023-03-01 NOTE — Telephone Encounter (Signed)
Sent!

## 2023-03-01 NOTE — Telephone Encounter (Signed)
Pt's wife, Kennyth Arnold, LVM @ 3:02p.  She is on DPR She would like refill of Alprazolam sent to   [facility] [id number] - [place], [place] - [address] [place] [address] Phone: [phone]  Fax: [phone]   Next appt 9/16

## 2023-03-31 ENCOUNTER — Other Ambulatory Visit: Payer: Self-pay | Admitting: Neurology

## 2023-04-20 ENCOUNTER — Other Ambulatory Visit: Payer: Self-pay | Admitting: Physician Assistant

## 2023-04-28 ENCOUNTER — Other Ambulatory Visit: Payer: Self-pay | Admitting: Neurology

## 2023-05-03 ENCOUNTER — Telehealth: Payer: Self-pay | Admitting: Adult Health

## 2023-05-03 ENCOUNTER — Telehealth: Payer: Medicare Other | Admitting: Adult Health

## 2023-05-03 DIAGNOSIS — G20C Parkinsonism, unspecified: Secondary | ICD-10-CM

## 2023-05-03 DIAGNOSIS — R269 Unspecified abnormalities of gait and mobility: Secondary | ICD-10-CM

## 2023-05-03 MED ORDER — CARBIDOPA-LEVODOPA 25-100 MG PO TABS: ORAL_TABLET | ORAL | 11 refills | Status: DC

## 2023-05-03 NOTE — Progress Notes (Signed)
PATIENT: [name] DOB: [birth year]  REASON FOR VISIT: follow up HISTORY FROM: patient  Virtual Visit via Video Note  I connected with Lorane Gell on 05/03/23 at  8:30 AM EDT by a video enabled telemedicine application located remotely at [facility] and verified that I am speaking with the correct person using two identifiers who was located at their own home.   I discussed the limitations of evaluation and management by telemedicine and the availability of in person appointments. The patient expressed understanding and agreed to proceed.   PATIENT: [name] DOB: [birth year]  REASON FOR VISIT: follow up HISTORY FROM: patient  HISTORY OF PRESENT ILLNESS:   HISTORY (YAN): He has PMHx of HTN, hypothyroidism, on supplement, bipolar disorder, taking risperidone 4 mg daily for more than ten years, lithium 300mg  tid, report level was within normal limit, he is also taking alprazolam 1mg  tid. His psychiatrist is Dr. Tomasa Rand, He had a lot of stress recently, his son died after struck by a truck in August 2013, his wife suffered stroke in August 2011.  He complains of bilateral hand tremor over the past few years, getting worse since 2013, fairly symmtric, if he holds anything more than 5 Lbs, for more than 10 minutes, he has right arm and hand shaking, he spills water when holding a glass of water. He denies significant gait difficulty, denies loss sense of smell. He denies family history of tremor.   Lab in July 25th 2014, showed normal CBC.      MRI of the brain done 07/19/2013 without acute findings. His tremor gets worse if he is anxious.   UPDATE Sep 9th 2015:He has gradually declining functional status, he is no longer working, sit at home most of the time, is no longer driving, he was recently evaluated by his psychiatrist Dr. Tomasa Rand in June 2015, Lithium was decreased from 900mg  daily to 600 every day, he is also taking Risperdal 4 mg every day He has  worsening memory trouble, constipation, restless he also reported worsening depression, to the point of suicidal thought sometimes    UPDATE Oct 26 2014: Sinemet 25/100 3 times a day since Sep 2015, has been very helpful, his tremor has much improved,  he continue has mild unsteady gait, recently complains of bilateral heel pain, thick skin at left medial heel.He is still taking Risperdal 4 mg every night, Xanax 1 mg half to one tablet as needed, lithium 300 mg 2 tablets every night, Synthroid 75 g once every day   UPDATE Oct 15 2015: His tremor is better with sinemet,  He complains of right foot pain, planning on to have surgery soon. Today his wife is also concerned about his slow worsening gait difficulty, unsteady gait, difficulty to clear his feet from the floor, worsening bilateral upper extremity weakness, bilateral deltoid muscle wasting    UPDATE January 28 2016: EMG nerve conduction study in October 30 2015 was normal, there was no evidence of large fiber peripheral neuropathy, right lumbar or right cervical radiculopathy. He is planning on to have right foot surgery for benign mass in January 30 2016   He is still on Sinemet 25/100 mg 3 times a day, which does help his tremor   UPDATE Jul 30 2016: Patient reported increased tremor, bradykinesia, memory loss since his lithium was increased from 600 mg to 900 mg every night per family this is based on laboratory evaluation, there is no significant change on patient mood,   In  addition he is also taking Risperdal 4 mg once every night, trazodone 50 mg every night, thyroid supplement. He is very frustrated about his slow movement, memory loss, bilateral hands tremor, cannot even clip his fingernails   [facility] PA Melony Overly Phone [phone]   UPDATE April 29 2017: He is with his wife, he has worsening memory loss, "just sit there",  Watch TV, he needs help dressing, bathing, wear depends.  He  drinks 3 boost a day, lack of appetite, he sleeps well.   He is now back to lithium 600mg  daily,  He is so flat. His wife is going to retire April 30 2017 to take care of him.   This is a sudden change compared to 2012-05-16, he was a Merchandiser, retail of a warehouse, this a sudden change since his son died of accident in 16-May-2012.  He had diagnosis of bipolar disorder since May 16, 2008,    We have reviewed repeat CT chest without contrast on June 12th 2018: Stable right lung base subpleural mass,   I was able to talk with his psychiatrist PA Melony Overly,, updated on the information, and also concerned about medication side effect   UPDATE Sept 11 2018:  He is accompanied by his wife at today's clinical visit, wife is apparently very frustrated about his flat affect, lack of motivations, he needs more help in his daily activity, personal care   He is on lower dose of risperidone 3mg  qhs, trazodone 50mg  qhs,  Lithium 600mg  qhs, he continue to take Sinemet 25/100 mg 3 times a day, which has helped his hand tremor.   I personally reviewed MRI of the brain on May 09 2017, there is generalized atrophy mild supratentorial small vessel disease Extensive laboratory evaluation showed normal ESR, CPK, CBC showed mild elevated WBC 11 point 9, vitamin D was decreased 18 point 7, CMP showed mild elevated glucose 107   Update May 23, 2020: Patient was diagnosed with secondary Parkinson's disease, due to his long-term neurotrophic medication treatment, he was started on Sinemet 25/100 mg 3 times daily since 2017/05/16, which did help his symptoms,   Personally reviewed MRI of the brain most recent was in 07/01/18generalized atrophy, supratentorium small vessel disease,   His Sinemet was increased from 1-1 and half 3 times a day due to his reported increased gait abnormality, drooling,    Higher dose of Sinemet, did help his movement, but he is dealing with nonhealing left foot ulcer, has peripheral vascular disease, has pending  procedure   Wife also reported that he has difficulty turning his body on bed, has to wake her up to help him using bathroom, he also had excessive drooling for more than 1 year, drooling into his plate, on his shirt, on the floor   UPDATE July 15 2020: This is his first Botox injection for hypersialorrhea, potential mechanical symptoms side effect explained, he tolerated the procedure well, used Botox a 100 units   Following last visit in July 2021, because of his complaints of difficulty turning at bedtime, restarted Sinemet CR 25/100 mg at bedtime, wife noticed that he continue have trouble sleeping, turning, also had excessive daytime sleepiness, there was no significant improvement adding on Sinemet CR, will stop it today, continue immediate release Sinemet 25/100 mg 1 and half tablets 3 times a day at 9 a.m., 1 PM, 5 PM   UPDATE Apr 09 2021: He is accompanied by his wife at today's clinical visit,  reported stable good response to Sinemet 25/100 mg 1 and half tablets 3 times a day at night a.m., 1 PM, 7 PM, does has difficulty turning at bedtime, but overall sleeps well, his left heel wound has healed, is out of boot, can walk much better now   We have tried Botox injection for excessive sialorrhea 3 times, wife did not notice any significant improvement, most bothersome is during the day especially when he tries to eat, also wet his pillow at nighttime   Update August 14, 2021 virtual visit via video: Location: Physician [facility] office, patient with his wife at home    I connected with Michoel Hohimer on 08/14/21 by a video enabled telemedicine application and verified that I am speaking with the correct person using two identifiers.   he is overall doing very well, continue have left heel nonhealing wound, continue mild gait abnormality, Sinemet 25/100 mg 1 and half tablets 3 times a day has been helpful He has excessive sialorrhea,with 3 trials of Botox injection from August 21 to February  2022, had a short trial of glycopyrrolate liquid form, low-dose, 0.5 mg twice daily as needed, did dry his mouth so much, he developed blisters     Virtual Visit via video UPDATE February 12 2022 I discussed the limitations of evaluation and management by telemedicine and the availability of in person appointments. The patient expressed understanding and agreed to proceed   Location: Provider: [facility] office; Patient: Home with his wife   I connected with Berney Puett  on on February 12 2022 by a video enabled telemedicine application and verified that I am speaking with the correct person using two identifiers.   He is overall stable, I have reviewed most recent psychiatric evaluation January 08, 2022, carries a diagnosis of bipolar disorder, anxiety, insomnia, doing well at current polypharmacy Xanax as needed, lithium 300 mg 2 tablets every night, Risperdal 4 mg every night   He continue to take Sinemet 25/100 mg 1 and half tablets 3 times a day, which has helped his ambulating,  His major limitation is nonhealing ulcer at left heel, now also involving right toes, has been under the care of wound care, reviewed podiatrist evaluation February 07, 2021, reporting progress, but wife stated is getting worse again, also began to involve right foot  Previously was also under the care of wound care, worried about vascular insufficiency, had angiogram, there was no pathology noted.  UPDATE 05/03/23 (VV):  Bryn Yerke is a 62 y.o. male with a history of Parkinson's disease. Returns today for follow-up.  His wife does most of the reporting today he continues to have trouble with his gait and balance.  She reports that he does have 2 pressure ulcers on his heels that is been there for the last year.  He does see wound clinic.  She reports that he also has a tremor sometimes in the upper and lower extremities.  Uses Sinemet 1 and half tablets 3 times a day.  Still struggles with drooling.  They did try the  glycopyrrolate drops but it made his mouth too dry causing his dentures to rub a sore.  His dentist recommended that he stop that medication.  His wife states that Botox was helpful and they would be interested in restarting this.       REVIEW OF SYSTEMS: Out of a complete 14 system review of symptoms, the patient complains only of the following symptoms, and all other reviewed systems are negative.  ALLERGIES: Allergies  Allergen Reactions   Codeine Other (See Comments)    Agitation/mean    Lamisil [Terbinafine Hcl]     Lost taste buds for 2 years    HOME MEDICATIONS: Outpatient Medications Prior to Visit  Medication Sig Dispense Refill   ALPRAZolam (XANAX) 1 MG tablet Take 1 tablet (1 mg total) by mouth 3 (three) times daily as needed. for anxiety 90 tablet 5   amLODipine (NORVASC) 5 MG tablet Take 5 mg by mouth daily.     aspirin 81 MG EC tablet Take 81 mg by mouth daily.      atorvastatin (LIPITOR) 20 MG tablet Take 20 mg by mouth at bedtime.     carbidopa-levodopa (SINEMET IR) 25-100 MG tablet TAKE 1 AND 1/2 TABLET BY MOUTH THREE TIMES A DAY NEEDS APPOINTMENT 135 tablet 0   Cholecalciferol (VITAMIN D3) 1000 units CAPS Take 1,000 Units by mouth daily.     gabapentin (NEURONTIN) 100 MG capsule Take 100 mg by mouth at bedtime.      Glycopyrrolate 1 MG/5ML SOLN Take 2.5 mLs (0.5 mg total) by mouth 2 (two) times daily as needed. (Patient not taking: Reported on 07/09/2021) 500 mL 6   levothyroxine (SYNTHROID) 75 MCG tablet TAKE ONE TABLET BY MOUTH EVERY MORNING 90 tablet 1   lithium carbonate 300 MG capsule TAKE TWO CAPSULES BY MOUTH EVERY NIGHT AT BEDTIME 180 capsule 0   Polyethyl Glycol-Propyl Glycol (SYSTANE OP) Place 1 drop into both eyes 4 (four) times daily as needed (dry eyes).     polyethylene glycol (MIRALAX / GLYCOLAX) 17 g packet Take 17 g by mouth daily.     risperidone (RISPERDAL) 4 MG tablet TAKE 1 TABLET BY MOUTH AT BEDTIME 90 tablet 0   No facility-administered  medications prior to visit.    PAST MEDICAL HISTORY: Past Medical History:  Diagnosis Date   Anxiety    Bipolar 1 disorder (HCC)    Depression    High blood pressure    High cholesterol    Parkinson's disease    Peripheral neuropathy    Thyroid disease    Tremor     PAST SURGICAL HISTORY: Past Surgical History:  Procedure Laterality Date   HEMORRHOID SURGERY     1980's   IR ANGIOGRAM EXTREMITY LEFT  05/28/2020   IR RADIOLOGIST EVAL & MGMT  03/14/2020   IR US GUIDE VASC ACCESS LEFT  05/28/2020   MULTIPLE TOOTH EXTRACTIONS     2016 Has top plate    FAMILY HISTORY: Family History  Problem Relation Age of Onset   Heart attack Mother    Liver disease Father     SOCIAL HISTORY: Social History   Socioeconomic History   Marital status: Married    Spouse name: Misty Stanley   Number of children: 1   Years of education: 12   Highest education level: Not on file  Occupational History    Comment: Not working  Tobacco Use   Smoking status: Former    Packs/day: .1    Types: Cigarettes    Quit date: 05/29/2019    Years since quitting: 3.9   Smokeless tobacco: Never   Tobacco comments:    only occasionally smokes  Vaping Use   Vaping Use: Never used  Substance and Sexual Activity   Alcohol use: No   Drug use: No    Comment: Quit 2009   Sexual activity: Not on file  Other Topics Concern   Not on file  Social History Narrative   Patient lives  at home with with his wife Kennyth Arnold). Patient is not working at this time. Patient has a 12th grade education.    Caffeine - four mountain dews daily.   Right handed.   Patient has one child.   Social Determinants of Health   Financial Resource Strain: Not on file  Food Insecurity: Not on file  Transportation Needs: Not on file  Physical Activity: Not on file  Stress: Not on file  Social Connections: Not on file  Intimate Partner Violence: Not on file      PHYSICAL EXAM Generalized: Well developed, in no acute distress    Neurological examination  Mentation: Alert oriented to time, place, history taking. Follows all commands.  Masked facies Cranial nerve II-XII:Extraocular movements were full.  Head turning and shoulder shrug  were normal and symmetric. Motor: Able to lift both his arms above his head. Coordination: Moderate to severe impairment of finger taps bilaterally.  Unable to complete toe taps Gait and station: Patient requires assistance with standing. Reflexes: UTA  DIAGNOSTIC DATA (LABS, IMAGING, TESTING) - I reviewed patient records, labs, notes, testing and imaging myself where available.  Lab Results  Component Value Date   WBC 11.0 (H) 05/28/2020   HGB 14.1 05/28/2020   HCT 43.9 05/28/2020   MCV 91.6 05/28/2020   PLT 229 05/28/2020      Component Value Date/Time   NA 139 05/28/2020 0735   NA 143 04/29/2017 0827   K 3.9 05/28/2020 0735   CL 105 05/28/2020 0735   CO2 23 05/28/2020 0735   GLUCOSE 104 (H) 05/28/2020 0735   BUN 12 05/28/2020 0735   BUN 10 04/29/2017 0827   CREATININE 0.99 05/28/2020 0735   CALCIUM 9.5 05/28/2020 0735   PROT 7.3 04/29/2017 0827   ALBUMIN 4.7 04/29/2017 0827   AST 12 04/29/2017 0827   ALT 14 04/29/2017 0827   ALKPHOS 116 04/29/2017 0827   BILITOT 0.5 04/29/2017 0827   GFRNONAA >60 05/28/2020 0735   GFRAA >60 05/28/2020 0735   Lab Results  Component Value Date   VITAMINB12 782 04/29/2017   Lab Results  Component Value Date   TSH 4.410 04/29/2017      ASSESSMENT AND PLAN 62 y.o. year old male  has a past medical history of Anxiety, Bipolar 1 disorder (HCC), Depression, High blood pressure, High cholesterol, Parkinson's disease, Peripheral neuropathy, Thyroid disease, and Tremor. here with:  1.  Parkinson's disease  Continue Sinemet 25-100 mg 1.5 tablet 3 times a day Referral will be placed for home health physical therapy to assist with gait and balance Advised that I would also send a message to Dr. Terrace Arabia about potentially restarting  Botox for sialorrhea Next follow-up appointment will be for an in office visit plus Botox injections if Dr. Murrell Converse is amenable    Butch Penny, MSN, NP-C 05/03/2023, 6:43 AM [facility] [address], [address] [place], [place] [address] [phone]

## 2023-05-03 NOTE — Telephone Encounter (Signed)
[facility] is going to take this patient. 

## 2023-05-05 ENCOUNTER — Ambulatory Visit
Admission: RE | Admit: 2023-05-05 | Discharge: 2023-05-05 | Disposition: A | Discharge status: Home / Self Care | Payer: Medicare Other | Source: Ambulatory Visit | Attending: Internal Medicine | Admitting: Internal Medicine

## 2023-05-05 DIAGNOSIS — Z720 Tobacco use: Secondary | ICD-10-CM

## 2023-06-10 ENCOUNTER — Encounter: Payer: Self-pay | Admitting: Adult Health

## 2023-06-10 DIAGNOSIS — K117 Disturbances of salivary secretion: Secondary | ICD-10-CM | POA: Diagnosis not present

## 2023-06-16 NOTE — Telephone Encounter (Signed)
I called his wife, she complains of excessive sialorrhea, " drool every where", also tried hard candy in the past, have the risk of choking,  In 2022, we had 3 sessions of Botox injection for hypersialorrhea, he reported mild improvement,  Will try again, preauthorization for Botox a 100 units, return to clinic in 3 to 4 weeks,  Please see the MyChart message reply(ies) for my assessment and plan.    This patient gave consent for this Medical Advice Message and is aware that it may result in a bill to [employer], as well as the possibility of receiving a bill for a co-payment or deductible. They are an established patient, but are not seeking medical advice exclusively about a problem treated during an in person or video visit in the last seven days. I did not recommend an in person or video visit within seven days of my reply.    I spent a total of 7 minutes cumulative time within 7 days through [employer].  Levert Feinstein, MD

## 2023-06-17 ENCOUNTER — Telehealth: Payer: Self-pay

## 2023-06-17 NOTE — Telephone Encounter (Signed)
PA needed BOTOX Jcode: [id number] 100 units CPT 64611 K11.7

## 2023-06-18 ENCOUNTER — Telehealth: Payer: Self-pay

## 2023-06-18 ENCOUNTER — Other Ambulatory Visit (HOSPITAL_COMMUNITY): Payer: Self-pay

## 2023-06-18 NOTE — Telephone Encounter (Signed)
Benefit Verification [id number] Submitted!  BOTOX ONE

## 2023-06-18 NOTE — Telephone Encounter (Signed)
PA request has been Submitted. New Encounter created for follow up. For additional info see Pharmacy Prior Auth telephone encounter from 06/18/2023.

## 2023-06-18 NOTE — Telephone Encounter (Signed)
Pharmacy Patient Advocate Encounter   Received notification from Physician's Office that prior authorization for Botox 100 units is required/requested.   Insurance verification completed.   The patient is insured through  [employer] [employer]  .   Per test claim: PA required; PA submitted to [facility] Medicare via [facility] Portal Key/confirmation #/EOC [id number] Status is pending

## 2023-06-23 NOTE — Telephone Encounter (Signed)
   Full Botox One report has been placed in the chart under the media tab.

## 2023-06-23 NOTE — Telephone Encounter (Signed)
Called and spoke with pt's wife, scheduled first Botox with Dr. Terrace Arabia for 07/14/23 @ 9:00 am.

## 2023-06-23 NOTE — Telephone Encounter (Signed)
     Approved for Buy and Bill. 

## 2023-07-06 ENCOUNTER — Other Ambulatory Visit: Payer: Self-pay | Admitting: Neurology

## 2023-07-14 ENCOUNTER — Ambulatory Visit (INDEPENDENT_AMBULATORY_CARE_PROVIDER_SITE_OTHER): Payer: Medicare Other | Admitting: Neurology

## 2023-07-14 VITALS — BP 108/71

## 2023-07-14 DIAGNOSIS — K117 Disturbances of salivary secretion: Secondary | ICD-10-CM

## 2023-07-14 DIAGNOSIS — R531 Weakness: Secondary | ICD-10-CM

## 2023-07-14 MED ORDER — ONABOTULINUMTOXINA 100 UNITS IJ SOLR
100.0000 [IU] | Freq: Once | INTRAMUSCULAR | Status: AC
Start: 2023-07-14 — End: 2023-07-14
  Administered 2023-07-14: 100 [IU] via INTRAMUSCULAR

## 2023-07-14 NOTE — Progress Notes (Signed)
Botox- 100 units x 1 vial Lot: [id number] Expiration: 2026/11 NDC: 1478-2956-21  Bacteriostatic 0.9% Sodium Chloride- 2 mL  Lot: [id number] Expiration: 02/15/24 NDC: 8469-6295-28  Dx: K11.7 B/B Witnessed by Dione Plover RN

## 2023-07-14 NOTE — Progress Notes (Signed)
   ASSESSMENT AND PLAN 62 y.o. year old male    Excessive drooling  Related to his parkinsonian features, lack of motor coordination,    Used 100 units of Botox, (100 units dissolving to 2 cc of normal saline)  50 units to each parotid gland, scattered into 4 injection sites,

## 2023-07-18 ENCOUNTER — Other Ambulatory Visit: Payer: Self-pay | Admitting: Physician Assistant

## 2023-08-01 ENCOUNTER — Other Ambulatory Visit: Payer: Self-pay | Admitting: Physician Assistant

## 2023-08-01 NOTE — Telephone Encounter (Signed)
Has appt 9/16

## 2023-08-02 ENCOUNTER — Ambulatory Visit: Payer: Medicare Other | Admitting: Physician Assistant

## 2023-08-02 ENCOUNTER — Encounter: Payer: Self-pay | Admitting: Physician Assistant

## 2023-08-02 DIAGNOSIS — F319 Bipolar disorder, unspecified: Secondary | ICD-10-CM | POA: Diagnosis not present

## 2023-08-02 DIAGNOSIS — G4700 Insomnia, unspecified: Secondary | ICD-10-CM

## 2023-08-02 DIAGNOSIS — E039 Hypothyroidism, unspecified: Secondary | ICD-10-CM

## 2023-08-02 DIAGNOSIS — F411 Generalized anxiety disorder: Secondary | ICD-10-CM | POA: Diagnosis not present

## 2023-08-02 DIAGNOSIS — R400 Somnolence: Secondary | ICD-10-CM

## 2023-08-02 NOTE — Progress Notes (Signed)
[facility]  Patient ID: [name],  MRN: [id number]  PCP: Alysia Penna, MD  Date of Evaluation: 08/02/2023 Time spent:20 minutes  Chief Complaint:  Chief Complaint   Follow-up    HISTORY/CURRENT STATUS: HPI For routine med check. Wife Kennyth Arnold is with him.  [name] is doing well for the most part.  He then states he feels like his mental health medications are working well.  He is not having any symptoms of depression.  Not crying easily.  No feelings of worthlessness or hopelessness. Sleeps well most of the time.  Appetite has not changed.  Weight is stable.  No suicidal or homicidal thoughts.  He has been in physical therapy for the Parkinson's which has helped some but he is resistant to doing exercises unless a therapist is there.  States he says that he just wants to sit around and watch TV.  He does not talk much anymore.  He needs even more assistance to get up and down and help with showers, etc.  Stacy reports no increased energy with decreased need for sleep, increased talkativeness, impulsivity, increased irritability or anger, paranoia, or hallucinations.  Review of Systems  Constitutional: Negative.   HENT: Negative.    Eyes: Negative.   Respiratory: Negative.    Cardiovascular: Negative.   Gastrointestinal: Negative.   Genitourinary: Negative.   Musculoskeletal: Negative.   Skin: Negative.   Neurological:  Positive for tremors and weakness.       See neuro notes on chart  Endo/Heme/Allergies: Negative.   Psychiatric/Behavioral:         See HPI   Individual Medical History/ Review of Systems: Changes? :Yes   see HPI  Past medications for mental health diagnoses include: Lithium, Risperdal, Xanax, Synthroid, Latuda, trazodone, Depakote, Lamictal  Allergies: Codeine and Lamisil [terbinafine hcl]  Current Medications:  Current Outpatient Medications:    ALPRAZolam (XANAX) 1 MG tablet, Take 1 tablet (1 mg total) by mouth 3 (three) times daily as  needed. for anxiety, Disp: 90 tablet, Rfl: 5   amLODipine (NORVASC) 5 MG tablet, Take 5 mg by mouth daily., Disp: , Rfl:    aspirin 81 MG EC tablet, Take 81 mg by mouth daily. , Disp: , Rfl:    atorvastatin (LIPITOR) 20 MG tablet, Take 20 mg by mouth at bedtime., Disp: , Rfl:    carbidopa-levodopa (SINEMET IR) 25-100 MG tablet, TAKE 1 AND 1/2 TABLET BY MOUTH THREE TIMES A DAY, Disp: 135 tablet, Rfl: 11   Cholecalciferol (VITAMIN D3) 1000 units CAPS, Take 1,000 Units by mouth daily., Disp: , Rfl:    gabapentin (NEURONTIN) 100 MG capsule, Take 100 mg by mouth at bedtime. , Disp: , Rfl:    levothyroxine (SYNTHROID) 75 MCG tablet, TAKE ONE TABLET BY MOUTH EVERY MORNING, Disp: 90 tablet, Rfl: 1   lithium carbonate 300 MG capsule, TAKE TWO CAPSULES BY MOUTH EVERY NIGHT AT BEDTIME, Disp: 60 capsule, Rfl: 0   polyethylene glycol (MIRALAX / GLYCOLAX) 17 g packet, Take 17 g by mouth daily., Disp: , Rfl:    risperidone (RISPERDAL) 4 MG tablet, TAKE 1 TABLET BY MOUTH AT BEDTIME, Disp: 30 tablet, Rfl: 0   Polyethyl Glycol-Propyl Glycol (SYSTANE OP), Place 1 drop into both eyes 4 (four) times daily as needed (dry eyes)., Disp: , Rfl:  Medication Side Effects: none  Family Medical/ Social History: Changes?  See HPI  MENTAL HEALTH EXAM:  There were no vitals taken for this visit.There is no height or weight on file to calculate  BMI.  General Appearance: Casual, Neat and Well Groomed  Eye Contact:  Good  Speech:  Clear and Coherent and Slow due to PD  Volume:  Normal  Mood:  Euthymic  Affect:  Flat   Thought Process:  Goal Directed and Descriptions of Associations: Intact  Orientation:  Full (Time, Place, and Person)  Thought Content: Logical   Suicidal Thoughts:  No  Homicidal Thoughts:  No  Memory:  WNL Difficult to assess though  Judgement:  Good  Insight:  Good  Psychomotor Activity:  Decreased, Shuffling Gait, and with rolling walker  Concentration:  Concentration: Good  Recall:  Good  Fund  of Knowledge: Good  Language: Good  Assets:  Desire for Improvement Financial Resources/Insurance Housing Transportation  ADL's:  Impaired  Cognition: WNL  Prognosis:  Fair    DIAGNOSES:    ICD-10-CM   1. Bipolar I disorder (HCC)  F31.9     2. Generalized anxiety disorder  F41.1     3. Hypothyroidism, unspecified type  E03.9     4. Somnolence  R40.0     5. Insomnia, unspecified type  G47.00       Receiving Psychotherapy: No   RECOMMENDATIONS: PDMP was reviewed.  Last Xanax filled 06/15/2023.  Gabapentin filled 07/01/2023. I provided 20 minutes of face to face time during this encounter, including time spent before and after the visit in records review, medical decision making, counseling pertinent to today's visit, and charting.   As far as his mental health medications go he is doing well.  No changes are necessary.  I encouraged him to do his exercises and follow all instructions from his physical therapist.  I reminded him that a fall could end up causing a fractured hip or some other fracture, requiring hospitalization and/or surgery, then rehab, then nursing home.  He does not want that so he needs to do what they say.  Continue Xanax 1 mg, 1/2-1 3 times daily as needed. Continue gabapentin 100 mg 1 nightly. Continue Synthroid 75 mcg q am. Continue lithium 300 mg, 2 p.o. nightly. Continue Risperdal 4 mg nightly. CBC with differential, CMP, TSH, lithium level, hemoglobin A1c, and lipid panel to be done when he has his physical in December.  These were written out on a prescription pad and given to [facility], she will give them to his PCP. Return in 6 months.  Melony Overly, PA-C

## 2023-08-15 ENCOUNTER — Other Ambulatory Visit: Payer: Self-pay | Admitting: Physician Assistant

## 2023-08-16 ENCOUNTER — Telehealth: Payer: Self-pay | Admitting: Physician Assistant

## 2023-08-16 MED ORDER — LITHIUM CARBONATE 300 MG PO CAPS: 600.0000 mg | ORAL_CAPSULE | Freq: Every evening | ORAL | 0 refills | Status: DC

## 2023-08-16 MED ORDER — RISPERIDONE 4 MG PO TABS: 4.0000 mg | ORAL_TABLET | Freq: Every day | ORAL | 0 refills | Status: DC

## 2023-08-16 NOTE — Telephone Encounter (Signed)
Sent!

## 2023-08-16 NOTE — Telephone Encounter (Signed)
Patient's wife lvm at 11:49 stating prescription for Risperidone and Lithium were written for 30 days and that [facility] has changed and needs a 90 supply for both instead 30 days or  there is an extra charge. Ph: [phone] Appt 3/17

## 2023-10-04 ENCOUNTER — Other Ambulatory Visit: Payer: Self-pay | Admitting: Physician Assistant

## 2023-10-06 ENCOUNTER — Ambulatory Visit: Payer: Medicare Other | Admitting: Neurology

## 2024-01-26 ENCOUNTER — Other Ambulatory Visit: Payer: Self-pay | Admitting: Physician Assistant

## 2024-01-28 ENCOUNTER — Other Ambulatory Visit: Payer: Self-pay | Admitting: Physician Assistant

## 2024-01-31 ENCOUNTER — Ambulatory Visit: Payer: Medicare Other | Admitting: Physician Assistant

## 2024-02-10 ENCOUNTER — Other Ambulatory Visit: Payer: Self-pay

## 2024-02-10 MED ORDER — LITHIUM CARBONATE 300 MG PO CAPS: 600.0000 mg | ORAL_CAPSULE | Freq: Every evening | ORAL | 0 refills | Status: DC

## 2024-02-10 MED ORDER — RISPERIDONE 4 MG PO TABS: 4.0000 mg | ORAL_TABLET | Freq: Every day | ORAL | 0 refills | Status: DC

## 2024-02-14 ENCOUNTER — Telehealth: Payer: Self-pay | Admitting: Adult Health

## 2024-02-14 NOTE — Telephone Encounter (Signed)
 Called and spoke to pts wife and was able to schedule 02/16/24 at 1pm as a work in request by Dr. Terrace Arabia

## 2024-02-14 NOTE — Telephone Encounter (Signed)
 If his wife could bring him in, better change to in person visit

## 2024-02-14 NOTE — Telephone Encounter (Signed)
 Wife has scheduled a f/u with Dr Terrace Arabia as a result of pt being unable to swallow solid food since Feb.  Wife reports pt has lost 21 pounds since the month of Feb, please call pt about any way pt can be seen as soon as possible.

## 2024-02-16 ENCOUNTER — Encounter: Payer: Self-pay | Admitting: Neurology

## 2024-02-16 ENCOUNTER — Ambulatory Visit: Admitting: Neurology

## 2024-02-16 VITALS — BP 96/64 | HR 99 | Ht 72.0 in | Wt 148.0 lb

## 2024-02-16 DIAGNOSIS — G20C Parkinsonism, unspecified: Secondary | ICD-10-CM | POA: Diagnosis not present

## 2024-02-16 DIAGNOSIS — R269 Unspecified abnormalities of gait and mobility: Secondary | ICD-10-CM

## 2024-02-16 DIAGNOSIS — K117 Disturbances of salivary secretion: Secondary | ICD-10-CM | POA: Diagnosis not present

## 2024-02-16 NOTE — Progress Notes (Signed)
 ASSESSMENT AND PLAN 63 y.o. year old male   Parkinsonism  Fairly symmetric, history of long-term psychiatric medication treatment, including Risperdal, lithium,  Was treated with Sinemet 25/100 mg since 2018, did report improvement initially, but benefit plateaued even with higher dose of Sinemet   Significant vertical gaze palsy, slow worsening cognitive impairment, raise the possibility of underlying central nervous system degenerative disorder, such as progressive supranuclear palsy or Parkinson-like syndrome  Discussed with his wife proceed with [facility]  Dysphagia, swallowing difficulty, rapid weight loss  Refer to barium swallowing study, speech therapy evaluation   Data Reviewed:   MRI of the brain without contrast on May 09, 2017: 1.  Some scattered T2/FLAIR hyperintense foci in the subcortical deep white matter of the hemispheres consistent with mild age-related chronic microvascular ischemic change. 2.  There are no acute findings. 3.  Brain volume is normal for age.  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   HISTORY OF PRESENT ILLNESS: He has PMHx of HTN, hypothyroidism, on supplement, bipolar disorder, taking risperidone 4 mg daily for more than ten years, lithium 300mg  tid, report level was within normal limit, he is also taking alprazolam 1mg  tid. His psychiatrist is Dr. Tomasa Rand, He had a lot of stress recently, his son died after struck by a truck in August 2013, his wife suffered stroke in August 2011.  He complains of bilateral hand tremor over the past few years, getting worse since 2013, fairly symmtric, if he holds anything more than 5 Lbs, for more than 10 minutes, he has right arm and hand shaking, he spills water when holding a glass of water. He denies significant gait difficulty, denies loss sense of smell. He denies  family history of tremor.   Lab in July 25th 2014, showed normal CBC.      MRI of the brain done 07/19/2013 without acute findings. His tremor gets worse if he is anxious.    He was noted to have mild parkinsonian features, was diagnosed with secondary Parkinson's disease this happened in the setting of long-term dopamine antagonist treatment, mood disorder, family stress, he was started on sinemet 25/100 mg since September 2015, initially reported significant improvement, has helped his tremor, gait abnormality  However, he continued to decline slowly, with worsening memory loss, gait abnormality, despite relative stable mood disorder,  [facility] is under [facility] care of [facility] PA Melony Overly Phone [phone]   Previous extensive laboratory evaluation showed normal ESR, CPK, CBC showed mild elevated WBC 11 point 9, vitamin D was decreased 18 point 7, CMP showed mild elevated glucose 107 UPDATE February 16 2024: He continued to decline slowly, more sedentary, barely carry on conversation with his wife, since beginning of 2025, he has developed slow worsening swallowing difficulty, wife has hold back his Xanax since February 2025 without improvement  Was recently seen by his primary care, chest x-ray showed aspiration pneumonia, antibiotic,  His gait has not changed much, able to ambulate with walker, do need help in and out of chair, wife is giving him Sinemet 25/100mg  2 tablets twice a day at 10 AM, 7 PM, has difficulty swallowing half pills  Chest x-ray February 14, 2024 right middle lobe cavity infiltrate versus scarring Laboratory evaluation from December 2024, normal CMP creatinine of 1.0, lithium level was within therapeutic range at 0.8, CBC showed hemoglobin of 13.3, vitamin D 39.6 Since February  2025, he has developed significant swallowing difficulty, no longer had his excessive drooling, with rapid weight loss of 20 pounds over the past few months  PHYSICAL EXAMNIATION:     02/16/2024   11:14 AM 07/14/2023    8:47 AM 04/09/2021   10:02 AM  Vitals with BMI  Height 6\' 0"   6\' 0"   Weight 148 lbs  162 lbs  BMI 20.07  21.97  Systolic 96 108 113  Diastolic 64 71 81  Pulse 99  92    Gen: NAD, conversant, well nourised, well groomed                     Cardiovascular: Regular rate rhythm, no peripheral edema, warm, nontender. Eyes: Conjunctivae clear without exudates or hemorrhage Neck: Supple, no carotid bruits. Pulmonary: Clear to auscultation bilaterally   NEUROLOGICAL EXAM:  MENTAL STATUS: Speech/cognition: Flat facial expression, oriented to his name, date of birth, only mild dysarthria CRANIAL NERVES: CN II: Visual fields are full to confrontation.  Pupils are round equal and briskly reactive to light. CN III, IV, VI: Significant vertical gaze palsy CN V: Facial sensation is intact to pinprick in all 3 divisions bilaterally. Corneal responses are intact.  CN VII: Face is symmetric with normal eye closure and smile. CN VIII: Hearing is normal to casual conversation CN IX, X: Palate elevates symmetrically. Phonation is normal. CN XI: Head turning and shoulder shrug are intact CN XII: Tongue is midline with normal movements and no atrophy.  MOTOR: Normal muscle strength, fairly symmetric moderate rigidity bradykinesia  REFLEXES: Reflexes are 1 and symmetric at the biceps, triceps, knees, and ankles. Plantar responses are flexor.  SENSORY: Intact to light touch,    COORDINATION: Rapid alternating movements and fine finger movements are intact. There is no dysmetria on finger-to-nose and heel-knee-shin.    GAIT/STANCE: Need help to get up from seated position, using transfer belt, rely on his walker, mild to moderate stride    REVIEW OF SYSTEMS: Out of a complete 14 system review of symptoms, the patient complains only of the following symptoms, and all other reviewed systems are negative.  Gait instability, falls  ALLERGIES: Allergies  Allergen  Reactions   Codeine Other (See Comments)    Agitation/mean    Lamisil [Terbinafine Hcl]     Lost taste buds for 2 years    HOME MEDICATIONS: Outpatient Medications Prior to Visit  Medication Sig Dispense Refill   ALPRAZolam (XANAX) 1 MG tablet TAKE 1 TABLET BY MOUTH 3 TIMES A DAY AS NEEDED FOR ANXIETY 90 tablet 5   amLODipine (NORVASC) 5 MG tablet Take 5 mg by mouth daily.     aspirin 81 MG EC tablet Take 81 mg by mouth daily.      atorvastatin (LIPITOR) 20 MG tablet Take 20 mg by mouth at bedtime.     carbidopa-levodopa (SINEMET IR) 25-100 MG tablet TAKE 1 AND 1/2 TABLET BY MOUTH THREE TIMES A DAY 135 tablet 11   Cholecalciferol (VITAMIN D3) 1000 units CAPS Take 1,000 Units by mouth daily.     gabapentin (NEURONTIN) 100 MG capsule Take 100 mg by mouth at bedtime.      levofloxacin (LEVAQUIN) 500 MG tablet      levothyroxine (SYNTHROID) 75 MCG tablet TAKE 1 TABLET BY MOUTH EVERY MORNING 30 tablet 0   lithium carbonate 300 MG capsule Take 2 capsules (600 mg total) by mouth at bedtime. TAKE TWO CAPSULES BY MOUTH EVERY NIGHT AT BEDTIME 180 capsule 0  risperidone (RISPERDAL) 4 MG tablet Take 1 tablet (4 mg total) by mouth at bedtime. 90 tablet 0   polyethylene glycol (MIRALAX / GLYCOLAX) 17 g packet Take 17 g by mouth daily. (Patient not taking: Reported on 02/16/2024)     No facility-administered medications prior to visit.    PAST MEDICAL HISTORY: Past Medical History:  Diagnosis Date   Anxiety    Bipolar 1 disorder (HCC)    Depression    High blood pressure    High cholesterol    Parkinson's disease (HCC)    Peripheral neuropathy    Thyroid disease    Tremor     PAST SURGICAL HISTORY: Past Surgical History:  Procedure Laterality Date   HEMORRHOID SURGERY     1980's   IR ANGIOGRAM EXTREMITY LEFT  05/28/2020   IR RADIOLOGIST EVAL & MGMT  03/14/2020   IR US GUIDE VASC ACCESS LEFT  05/28/2020   MULTIPLE TOOTH EXTRACTIONS     2016 Has top plate    FAMILY HISTORY: Family  History  Problem Relation Age of Onset   Heart attack Mother    Liver disease Father     SOCIAL HISTORY: Social History   Socioeconomic History   Marital status: Married    Spouse name: Misty Stanley   Number of children: 1   Years of education: 12   Highest education level: Not on file  Occupational History    Comment: Not working  Tobacco Use   Smoking status: Former    Current packs/day: 0.00    Types: Cigarettes    Quit date: 05/29/2019    Years since quitting: 4.7   Smokeless tobacco: Never   Tobacco comments:    only occasionally smokes  Vaping Use   Vaping status: Never Used  Substance and Sexual Activity   Alcohol use: No   Drug use: No    Comment: Quit 2009   Sexual activity: Not on file  Other Topics Concern   Not on file  Social History Narrative   Patient lives at home with with his wife Kennyth Arnold). Patient is not working at this time. Patient has a 12th grade education.    Caffeine - four mountain dews daily.   Right handed.   Patient has one child.   Social Drivers of Corporate investment banker Strain: Not on file  Food Insecurity: Not on file  Transportation Needs: Not on file  Physical Activity: Not on file  Stress: Not on file  Social Connections: Not on file  Intimate Partner Violence: Not on file        [name], M.D. Ph.D.  [facility] [address] [place], [place] [address] Phone: [phone] Fax:      [phone]

## 2024-02-16 NOTE — Addendum Note (Signed)
 Addended by: Levert Feinstein on: 02/16/2024 02:12 PM   Modules accepted: Orders

## 2024-02-17 ENCOUNTER — Other Ambulatory Visit: Payer: Self-pay | Admitting: Physician Assistant

## 2024-02-17 ENCOUNTER — Other Ambulatory Visit (HOSPITAL_COMMUNITY): Payer: Self-pay | Admitting: Neurology

## 2024-02-17 DIAGNOSIS — R1310 Dysphagia, unspecified: Secondary | ICD-10-CM

## 2024-02-17 DIAGNOSIS — R059 Cough, unspecified: Secondary | ICD-10-CM

## 2024-02-23 ENCOUNTER — Telehealth: Payer: Self-pay | Admitting: Neurology

## 2024-02-23 NOTE — Telephone Encounter (Signed)
 [employer] medicare Berkley Harvey: [id number] exp. 02/23/24-04/08/24 sent to [facility] [phone]

## 2024-03-01 ENCOUNTER — Ambulatory Visit (HOSPITAL_COMMUNITY)
Admission: RE | Admit: 2024-03-01 | Discharge: 2024-03-01 | Disposition: A | Discharge status: Home / Self Care | Source: Ambulatory Visit | Attending: Internal Medicine | Admitting: Internal Medicine

## 2024-03-01 DIAGNOSIS — G20C Parkinsonism, unspecified: Secondary | ICD-10-CM | POA: Diagnosis not present

## 2024-03-01 DIAGNOSIS — R1310 Dysphagia, unspecified: Secondary | ICD-10-CM | POA: Diagnosis present

## 2024-03-01 DIAGNOSIS — K117 Disturbances of salivary secretion: Secondary | ICD-10-CM | POA: Insufficient documentation

## 2024-03-01 DIAGNOSIS — R059 Cough, unspecified: Secondary | ICD-10-CM

## 2024-03-01 DIAGNOSIS — R269 Unspecified abnormalities of gait and mobility: Secondary | ICD-10-CM | POA: Insufficient documentation

## 2024-03-01 NOTE — Evaluation (Signed)
 Modified Barium Swallow Study  Patient Details  Name: [name] MRN: [id number] Date of Birth: May 16, 1961  Today's Date: 03/01/2024  Modified Barium Swallow completed.  Full report located under Chart Review in the Imaging Section.  History of Present Illness Pt is a 63 y.o. male who presents today with c/o trouble swallowing which began in February, 2025. Pt's wife was present and provided further hx for pt. Pt has lost 30 pounds since February according to pt's wife. She also notes that he typically has problems with both liquids and solids; with liquids, she notes that pt recently had aspiration pneumonia, and with solids, pt has choking episodes after 3 bites. Recent note from pt's neurologist states that pt has had continued decline characterized by increasing swallowing issues, memory loss, and   Clinical Impression Pt presents with a moderate oropharyngeal dysphagia (DIGEST Score: 2) primarily characterized by silent aspiration and penetration of thin liquids and mildly-thick (nectar-thick) liquids and pharyngeal residue with suspected esophageal dysmotility with retrograde movement through UES.   Oral phase was characterized by disorganized tongue movement during oral transit of solids, collection of oral residue, and instances of premature spillage which potentially contributed to timing issues during aspiration events. Silent aspiration of thin-liquids was more pronounced than with nectar-thick. Thin-liquid aspiration events appeared to be a timing issue with pt consuming large volume of bolus, with premature spillage, leading to aspiration. Pt had no cough response during each aspiration event. Pharyngeal swallow displayed reduced ROM of hyolaryngeal excursion and epiglottic inversion. Bolus transit through UES was partially obstructed due to prominent CP bar and reduced time of UES opening. Retrograde movement was noted through the UES, which contributed to collection of pharyngeal  residue. During esophageal sweep of solid bolus, significant esophageal retention was observed with retrograde movement towards UES. Thin-liquids were not successful for clearing pill, while nectar-thick liquid cleared pill into esophagus. Liquid wash and multiple swallows were beneficial for gradually clearing pharyngeal residue. An effortful swallow was attempted to optimize airway safety, but pt had difficulties with understanding the directions.   Recommendations include consultation with GI due to observed potential esophageal dysmotility and retrograde movement, which could be contributing to pt's recent involuntary weight loss. Given multifactorial severity of dysphagia, discussions about other methods for supplementing nutrition might be beneficial to support pt's wellbeing. Diet recommendation is for a puree/minced and moist and nectar-thick liquids due to increased airway safety observed with these textures. Use of a slow rate, small bites/sips, multiple swallows, and liquid wash are recommended to optimize pt swallow safety and efficiency. Outpatient SLP referral is also encouraged to reinforce compensatory strategies and for pharyngeal strengthening.   Factors that may increase risk of adverse event in presence of aspiration [name] & [name] 2021): Frail or deconditioned;Reduced cognitive function;Weak cough;Inadequate oral hygiene;Dependence for feeding and/or oral hygiene  Swallow Evaluation Recommendations Recommendations: PO diet PO Diet Recommendation: Mildly thick liquids (Level 2, nectar thick);Dysphagia 1 (Pureed) (Puree/Minced and moist (IDDSI 4/5)) Medication Administration: Whole meds with liquid Postural changes: Position pt fully upright for meals;Stay upright 30-60 min after meals Oral care recommendations: Oral care BID (2x/day);Oral care before PO Recommended consults: Consider GI consultation      [name] 03/01/2024,1:20 PM

## 2024-03-08 ENCOUNTER — Ambulatory Visit (HOSPITAL_COMMUNITY)

## 2024-03-08 ENCOUNTER — Encounter (HOSPITAL_COMMUNITY)

## 2024-03-08 ENCOUNTER — Encounter (HOSPITAL_COMMUNITY): Payer: Self-pay

## 2024-03-10 ENCOUNTER — Ambulatory Visit (HOSPITAL_COMMUNITY)
Admission: RE | Admit: 2024-03-10 | Discharge: 2024-03-10 | Disposition: A | Discharge status: Home / Self Care | Source: Ambulatory Visit | Attending: Neurology | Admitting: Neurology

## 2024-03-10 DIAGNOSIS — K117 Disturbances of salivary secretion: Secondary | ICD-10-CM | POA: Insufficient documentation

## 2024-03-10 DIAGNOSIS — R269 Unspecified abnormalities of gait and mobility: Secondary | ICD-10-CM | POA: Diagnosis present

## 2024-03-10 DIAGNOSIS — G20C Parkinsonism, unspecified: Secondary | ICD-10-CM | POA: Diagnosis present

## 2024-03-10 MED ORDER — POTASSIUM IODIDE (ANTIDOTE) 130 MG PO TABS
ORAL_TABLET | ORAL | Status: AC
  Filled 2024-03-10: qty 1

## 2024-03-10 MED ORDER — IOFLUPANE I 123 185 MBQ/2.5ML IV SOLN
5.0100 | Freq: Once | INTRAVENOUS | Status: AC | PRN
  Administered 2024-03-10: 5.01 via INTRAVENOUS

## 2024-03-14 ENCOUNTER — Encounter: Payer: Self-pay | Admitting: Neurology

## 2024-03-21 ENCOUNTER — Encounter: Payer: Self-pay | Admitting: Physician Assistant

## 2024-03-21 ENCOUNTER — Ambulatory Visit: Admitting: Physician Assistant

## 2024-03-21 DIAGNOSIS — F411 Generalized anxiety disorder: Secondary | ICD-10-CM

## 2024-03-21 DIAGNOSIS — G4700 Insomnia, unspecified: Secondary | ICD-10-CM | POA: Diagnosis not present

## 2024-03-21 DIAGNOSIS — F319 Bipolar disorder, unspecified: Secondary | ICD-10-CM

## 2024-03-21 MED ORDER — LEVOTHYROXINE SODIUM 75 MCG PO TABS: 75.0000 ug | ORAL_TABLET | Freq: Every morning | ORAL | 1 refills | Status: DC

## 2024-03-21 MED ORDER — RISPERIDONE 4 MG PO TABS: 4.0000 mg | ORAL_TABLET | Freq: Every day | ORAL | 1 refills | Status: DC

## 2024-03-21 MED ORDER — LITHIUM CARBONATE 300 MG PO CAPS: 600.0000 mg | ORAL_CAPSULE | Freq: Every evening | ORAL | 1 refills | Status: DC

## 2024-03-21 NOTE — Progress Notes (Unsigned)
 [facility]  Patient ID: [name],  MRN: [id number]  PCP: Barnetta Liberty, MD  Date of Evaluation: 03/21/2024 Time spent:30 minutes  Chief Complaint:  Chief Complaint   Follow-up    HISTORY/CURRENT STATUS: HPI For routine med check. Wife [name] is with him.  [name] has not been doing well physically.  For the past couple of months he has not been able to eat.  He is unable to feel when "something goes down the wrong way" to cough and clear his throat.  There are only a few things he is able to drink, and now he can eat mash potatoes and gravy and Jell-O.  That is it.  He has lost quite a bit of weight.  He has had a barium swallow, and will see GI on 03/23/2024.  He is still able to get up and go to the bathroom on his own but he doesn't do much else.  He is now sleeping in the recliner in the living room.  [name] sleeps on the couch in case he needs her in the middle of the night.  Even with these physical symptoms, his mental health is stable.  [name] took him off the Xanax  a month or so ago due to his difficulty swallowing.  She tried to take any medications away that were not 100% necessary.  The anxiety has not worsened, "he seems to be doing fine without it."  He does not cry easily.  Personal hygiene is normal but he needs assistance with bathing.  He is following up with neurology for the Parkinson's as directed.  He is not having any manic symptoms.  No hallucinations or psychosis.  Review of Systems  Constitutional:        See HPI  HENT: Negative.    Eyes: Negative.   Respiratory: Negative.    Cardiovascular: Negative.   Gastrointestinal:        See HPI  Genitourinary: Negative.   Musculoskeletal:        Weakness from Parkinson's  Skin: Negative.   Neurological:        See HPI  Endo/Heme/Allergies: Negative.   Psychiatric/Behavioral:         See HPI   Individual Medical History/ Review of Systems: Changes? :Yes   see above  Past medications for mental  health diagnoses include: Lithium , Risperdal , Xanax , Synthroid, Latuda, trazodone, Depakote, Lamictal  Allergies: Codeine and Lamisil [terbinafine hcl]  Current Medications:  Current Outpatient Medications:    aspirin  81 MG EC tablet, Take 81 mg by mouth daily. , Disp: , Rfl:    atorvastatin (LIPITOR) 20 MG tablet, Take 20 mg by mouth at bedtime., Disp: , Rfl:    carbidopa -levodopa  (SINEMET  IR) 25-100 MG tablet, TAKE 1 AND 1/2 TABLET BY MOUTH THREE TIMES A DAY, Disp: 135 tablet, Rfl: 11   Cholecalciferol (VITAMIN D3) 1000 units CAPS, Take 1,000 Units by mouth daily., Disp: , Rfl:    gabapentin  (NEURONTIN ) 100 MG capsule, Take 100 mg by mouth at bedtime. , Disp: , Rfl:    ALPRAZolam  (XANAX ) 1 MG tablet, TAKE 1 TABLET BY MOUTH 3 TIMES A DAY AS NEEDED FOR ANXIETY (Patient not taking: Reported on 03/21/2024), Disp: 90 tablet, Rfl: 5   amLODipine (NORVASC) 5 MG tablet, Take 5 mg by mouth daily. (Patient not taking: Reported on 03/21/2024), Disp: , Rfl:    levofloxacin (LEVAQUIN) 500 MG tablet, , Disp: , Rfl:    levothyroxine (SYNTHROID) 75 MCG tablet, Take 1 tablet (75 mcg total) by  mouth every morning., Disp: 90 tablet, Rfl: 1   lithium  carbonate 300 MG capsule, Take 2 capsules (600 mg total) by mouth at bedtime. TAKE TWO CAPSULES BY MOUTH EVERY NIGHT AT BEDTIME, Disp: 180 capsule, Rfl: 1   polyethylene glycol (MIRALAX / GLYCOLAX) 17 g packet, Take 17 g by mouth daily. (Patient not taking: Reported on 02/16/2024), Disp: , Rfl:    risperidone  (RISPERDAL ) 4 MG tablet, Take 1 tablet (4 mg total) by mouth at bedtime., Disp: 90 tablet, Rfl: 1 Medication Side Effects: none  Family Medical/ Social History: Changes?  See HPI  MENTAL HEALTH EXAM:  There were no vitals taken for this visit.There is no height or weight on file to calculate BMI.  General Appearance: Casual, Neat and Well Groomed  Eye Contact:  Good  Speech:  Clear and Coherent and Slow due to PD  Volume:  Normal  Mood:  Euthymic  Affect:   Flat   Thought Process:  Goal Directed and Descriptions of Associations: Intact  Orientation:  Full (Time, Place, and Person)  Thought Content: Logical   Suicidal Thoughts:  No  Homicidal Thoughts:  No  Memory:  WNL Difficult to assess though  Judgement:  Good  Insight:  Good  Psychomotor Activity:   In wheelchair  Concentration:  Concentration: Good  Recall:  Good  Fund of Knowledge: Good  Language: Good  Assets:  Desire for Improvement Financial Resources/Insurance Housing Transportation  ADL's:  Impaired  Cognition: WNL  Prognosis:  Fair   PCP follows labs. Modified barium swallow on 03/01/2024, results reviewed. DAT scan on 03/10/2024 results reviewed.  DIAGNOSES:    ICD-10-CM   1. Bipolar I disorder (HCC)  F31.9     2. Generalized anxiety disorder  F41.1     3. Insomnia, unspecified type  G47.00      Receiving Psychotherapy: No   RECOMMENDATIONS: PDMP was reviewed.  Last Xanax  filled 12/22/2023.  Gabapentin  filled 12/22/2023. I provided 30 minutes of face to face time during this encounter, including time spent before and after the visit in records review, medical decision making, counseling pertinent to today's visit, and charting.   As far as his mental health meds go, he is stable.  No increased anxiety since stopping Xanax , no reason to restart it.  Continue gabapentin  100 mg 1 nightly. Continue Synthroid 75 mcg q am. Continue lithium  300 mg, 2 p.o. nightly. Continue Risperdal  4 mg nightly. Handwritten order for CMP, TSH, lithium  level, lipid panel, hemoglobin A1c to be drawn when he has labs for PCP next visit.  That should be done within the next 4 months or so. Return in 6 months.  Marvia Slocumb, PA-C

## 2024-04-27 ENCOUNTER — Ambulatory Visit: Admitting: Neurology

## 2024-06-08 ENCOUNTER — Encounter: Payer: Self-pay | Admitting: Physician Assistant

## 2024-06-08 ENCOUNTER — Ambulatory Visit: Payer: Self-pay | Admitting: Physician Assistant

## 2024-06-08 NOTE — Progress Notes (Signed)
 Please let his wife, Harlene, know that I got the lab results and they're good.  Normal TSH, normal kidney functions, and Lithium  level is 0.9 which is good.  Continue sames dose.

## 2024-07-03 ENCOUNTER — Other Ambulatory Visit: Payer: Self-pay | Admitting: Neurology

## 2024-09-15 ENCOUNTER — Other Ambulatory Visit: Payer: Self-pay | Admitting: Physician Assistant

## 2024-09-18 ENCOUNTER — Other Ambulatory Visit: Payer: Self-pay

## 2024-09-18 MED ORDER — LEVOTHYROXINE SODIUM 75 MCG PO TABS: 75.0000 ug | ORAL_TABLET | Freq: Every morning | ORAL | 1 refills | Status: AC

## 2024-09-19 ENCOUNTER — Encounter: Payer: Self-pay | Admitting: Adult Health

## 2024-09-19 ENCOUNTER — Ambulatory Visit: Admitting: Adult Health

## 2024-09-19 VITALS — BP 121/71 | HR 67 | Ht 72.0 in | Wt 137.8 lb

## 2024-09-19 DIAGNOSIS — G20C Parkinsonism, unspecified: Secondary | ICD-10-CM | POA: Diagnosis not present

## 2024-09-19 MED ORDER — CARBIDOPA-LEVODOPA 25-100 MG PO TABS: ORAL_TABLET | ORAL | 11 refills | Status: DC

## 2024-09-19 NOTE — Patient Instructions (Signed)
 Your Plan:  Continue Sinemet  2 tablets in the AM and 1 tablet at noon and bedtime If your symptoms worsen or you develop new symptoms please let us  know.   Thank you for coming to see us  at [facility]. I hope we have been able to provide you high quality care today.  You may receive a patient satisfaction survey over the next few weeks. We would appreciate your feedback and comments so that we may continue to improve ourselves and the health of our patients.

## 2024-09-19 NOTE — Progress Notes (Signed)
 PATIENT: [name] DOB: 10-May-1961  REASON FOR VISIT: follow up HISTORY FROM: patient  Chief Complaint  Patient presents with   Follow-up    Pt in 4 with wife Pt here for Parkinson f/u Wife states left hand pinky contracted      HISTORY OF PRESENT ILLNESS:   HISTORY (YAN): He has PMHx of HTN, hypothyroidism, on supplement, bipolar disorder, taking risperidone  4 mg daily for more than ten years, lithium  300mg  tid, report level was within normal limit, he is also taking alprazolam  1mg  tid. His psychiatrist is Dr. Stacia, He had a lot of stress recently, his son died after struck by a truck in August 2013, his wife suffered stroke in August 2011.  He complains of bilateral hand tremor over the past few years, getting worse since 2013, fairly symmtric, if he holds anything more than 5 Lbs, for more than 10 minutes, he has right arm and hand shaking, he spills water when holding a glass of water. He denies significant gait difficulty, denies loss sense of smell. He denies family history of tremor.   Lab in July 25th 2014, showed normal CBC.      MRI of the brain done 07/19/2013 without acute findings. His tremor gets worse if he is anxious.   UPDATE Sep 9th 2015:He has gradually declining functional status, he is no longer working, sit at home most of the time, is no longer driving, he was recently evaluated by his psychiatrist Dr. Stacia in June 2015, Lithium  was decreased from 900mg  daily to 600 every day, he is also taking Risperdal  4 mg every day He has worsening memory trouble, constipation, restless he also reported worsening depression, to the point of suicidal thought sometimes    UPDATE Oct 26 2014: Sinemet  25/100 3 times a day since Sep 2015, has been very helpful, his tremor has much improved,  he continue has mild unsteady gait, recently complains of bilateral heel pain, thick skin at left medial heel.He is still taking Risperdal  4 mg every night, Xanax  1 mg half  to one tablet as needed, lithium  300 mg 2 tablets every night, Synthroid  75 g once every day   UPDATE Oct 15 2015: His tremor is better with sinemet ,  He complains of right foot pain, planning on to have surgery soon. Today his wife is also concerned about his slow worsening gait difficulty, unsteady gait, difficulty to clear his feet from the floor, worsening bilateral upper extremity weakness, bilateral deltoid muscle wasting    UPDATE January 28 2016: EMG nerve conduction study in October 30 2015 was normal, there was no evidence of large fiber peripheral neuropathy, right lumbar or right cervical radiculopathy. He is planning on to have right foot surgery for benign mass in January 30 2016   He is still on Sinemet  25/100 mg 3 times a day, which does help his tremor   UPDATE Jul 30 2016: Patient reported increased tremor, bradykinesia, memory loss since his lithium  was increased from 600 mg to 900 mg every night per family this is based on laboratory evaluation, there is no significant change on patient mood,   In addition he is also taking Risperdal  4 mg once every night, trazodone 50 mg every night, thyroid  supplement. He is very frustrated about his slow movement, memory loss, bilateral hands tremor, cannot even clip his fingernails   He is under the care of [facility] PA Verneita Cooks Phone [phone]   UPDATE April 29 2017: He is  with his wife, he has worsening memory loss, just sit there,  Watch TV, he needs help dressing, bathing, wear depends.  He drinks 3 boost a day, lack of appetite, he sleeps well.   He is now back to lithium  600mg  daily,  He is so flat. His wife is going to retire April 30 2017 to take care of him.   This is a sudden change compared to 10/02/2012, he was a merchandiser, retail of a warehouse, this a sudden change since his son died of accident in 2012/10/02.  He had diagnosis of bipolar disorder since 10/02/2008,    We have reviewed repeat CT chest without contrast on June  12th 2018: Stable right lung base subpleural mass,   I was able to talk with his psychiatrist PA Verneita Cooks,, updated on the information, and also concerned about medication side effect   UPDATE Sept 11 2018:  He is accompanied by his wife at today's clinical visit, wife is apparently very frustrated about his flat affect, lack of motivations, he needs more help in his daily activity, personal care   He is on lower dose of risperidone  3mg  qhs, trazodone 50mg  qhs,  Lithium  600mg  qhs, he continue to take Sinemet  25/100 mg 3 times a day, which has helped his hand tremor.   I personally reviewed MRI of the brain on May 09 2017, there is generalized atrophy mild supratentorial small vessel disease Extensive laboratory evaluation showed normal ESR, CPK, CBC showed mild elevated WBC 11 point 9, vitamin D  was decreased 18 point 7, CMP showed mild elevated glucose 107   Update May 23, 2020: Patient was diagnosed with secondary Parkinson's disease, due to his long-term neurotrophic medication treatment, he was started on Sinemet  25/100 mg 3 times daily since 10/02/2017, which did help his symptoms,   Personally reviewed MRI of the brain most recent was in June 2018, generalized atrophy, supratentorium small vessel disease,   His Sinemet  was increased from 1-1 and half 3 times a day due to his reported increased gait abnormality, drooling,    Higher dose of Sinemet , did help his movement, but he is dealing with nonhealing left foot ulcer, has peripheral vascular disease, has pending procedure   Wife also reported that he has difficulty turning his body on bed, has to wake her up to help him using bathroom, he also had excessive drooling for more than 1 year, drooling into his plate, on his shirt, on the floor   UPDATE July 15 2020: This is his first Botox  injection for hypersialorrhea, potential mechanical symptoms side effect explained, he tolerated the procedure well, used Botox  a 100 units    Following last visit in July 2021, because of his complaints of difficulty turning at bedtime, restarted Sinemet  CR 25/100 mg at bedtime, wife noticed that he continue have trouble sleeping, turning, also had excessive daytime sleepiness, there was no significant improvement adding on Sinemet  CR, will stop it today, continue immediate release Sinemet  25/100 mg 1 and half tablets 3 times a day at 9 a.m., 1 PM, 5 PM   UPDATE Apr 09 2021: He is accompanied by his wife at today's clinical visit, reported stable good response to Sinemet  25/100 mg 1 and half tablets 3 times a day at night a.m., 1 PM, 7 PM, does has difficulty turning at bedtime, but overall sleeps well, his left heel wound has healed, is out of boot, can walk much better now   We have tried Botox  injection for excessive sialorrhea 3 times,  wife did not notice any significant improvement, most bothersome is during the day especially when he tries to eat, also wet his pillow at nighttime   Update August 14, 2021 virtual visit via video: Location: Physician [facility] office, patient with his wife at home    I connected with [name] on 08/14/21 by a video enabled telemedicine application and verified that I am speaking with the correct person using two identifiers.   he is overall doing very well, continue have left heel nonhealing wound, continue mild gait abnormality, Sinemet  25/100 mg 1 and half tablets 3 times a day has been helpful He has excessive sialorrhea,with 3 trials of Botox  injection from August 21 to February 2022, had a short trial of glycopyrrolate  liquid form, low-dose, 0.5 mg twice daily as needed, did dry his mouth so much, he developed blisters     Virtual Visit via video UPDATE February 12 2022 I discussed the limitations of evaluation and management by telemedicine and the availability of in person appointments. The patient expressed understanding and agreed to proceed   Location: Provider: [facility] office; Patient: Home  with his wife   I connected with [name]  on on February 12 2022 by a video enabled telemedicine application and verified that I am speaking with the correct person using two identifiers.   He is overall stable, I have reviewed most recent psychiatric evaluation January 08, 2022, carries a diagnosis of bipolar disorder, anxiety, insomnia, doing well at current polypharmacy Xanax  as needed, lithium  300 mg 2 tablets every night, Risperdal  4 mg every night   He continue to take Sinemet  25/100 mg 1 and half tablets 3 times a day, which has helped his ambulating,  His major limitation is nonhealing ulcer at left heel, now also involving right toes, has been under the care of wound care, reviewed podiatrist evaluation February 07, 2021, reporting progress, but wife stated is getting worse again, also began to involve right foot  Previously was also under the care of wound care, worried about vascular insufficiency, had angiogram, there was no pathology noted.  UPDATE 09/19/24 (VV):  [name] is a 63 y.o. male with a history of Parkinson's disease. Returns today for follow-up.  His wife does most of the reporting today he continues to have trouble with his gait and balance.  She reports that he does have 2 pressure ulcers on his heels that is been there for the last year.  He does see wound clinic.  She reports that he also has a tremor sometimes in the upper and lower extremities.  Uses Sinemet  1 and half tablets 3 times a day.  Still struggles with drooling.  They did try the glycopyrrolate  drops but it made his mouth too dry causing his dentures to rub a sore.  His dentist recommended that he stop that medication.  His wife states that Botox  was helpful and they would be interested in restarting this.  UPDATE February 16 2024: He continued to decline slowly, more sedentary, barely carry on conversation with his wife, since beginning of 2025, he has developed slow worsening swallowing difficulty, wife  has hold back his Xanax  since February 2025 without improvement   Was recently seen by his primary care, chest x-ray showed aspiration pneumonia, antibiotic,   His gait has not changed much, able to ambulate with walker, do need help in and out of chair, wife is giving him Sinemet  25/100mg  2 tablets twice a day at 10 AM, 7 PM, has difficulty swallowing  half pills   Chest x-ray February 14, 2024 right middle lobe cavity infiltrate versus scarring Laboratory evaluation from December 2024, normal CMP creatinine of 1.0, lithium  level was within therapeutic range at 0.8, CBC showed hemoglobin of 13.3, vitamin D  39.6 Since February 2025, he has developed significant swallowing difficulty, no longer had his excessive drooling, with rapid weight loss of 20 pounds over the past few months  Update 09/19/24: [name] is a 63 y.o. male with a history of Parkinson's disease. Returns today for follow-up.  He is here today with his wife.  Overall she feels that he has remained stable.  He last saw Dr. Ross in she did a DaTscan  which was within normal limits.  Patient remains on Sinemet  2 tablets in the morning 1 at noon and 1 at bedtime.  He requires assistance with all ADLs.  Primarily in a wheelchair but can use a walker at home to go to the bathroom.  Reports contracture of the left pinky finger.  Reports urinary incontinence.  Returns today for an evaluation.   REVIEW OF SYSTEMS: Out of a complete 14 system review of symptoms, the patient complains only of the following symptoms, and all other reviewed systems are negative.  ALLERGIES: Allergies  Allergen Reactions   Codeine Other (See Comments)    Agitation/mean    Lamisil [Terbinafine Hcl]     Lost taste buds for 2 years    HOME MEDICATIONS: Outpatient Medications Prior to Visit  Medication Sig Dispense Refill   amLODipine (NORVASC) 5 MG tablet Take 5 mg by mouth daily. (Patient not taking: Reported on 03/21/2024)     aspirin  81 MG EC tablet Take 81  mg by mouth daily.      atorvastatin (LIPITOR) 20 MG tablet Take 20 mg by mouth at bedtime.     carbidopa -levodopa  (SINEMET  IR) 25-100 MG tablet TAKE 1 AND 1/2 TABLET BY MOUTH THREE TIMES A DAY 405 tablet 0   Cholecalciferol (VITAMIN D3) 1000 units CAPS Take 1,000 Units by mouth daily.     gabapentin  (NEURONTIN ) 100 MG capsule Take 100 mg by mouth at bedtime.      levofloxacin (LEVAQUIN) 500 MG tablet      levothyroxine  (SYNTHROID ) 75 MCG tablet Take 1 tablet (75 mcg total) by mouth every morning. 90 tablet 1   lithium  carbonate 300 MG capsule Take 2 capsules (600 mg total) by mouth at bedtime. TAKE TWO CAPSULES BY MOUTH EVERY NIGHT AT BEDTIME 180 capsule 1   polyethylene glycol (MIRALAX / GLYCOLAX) 17 g packet Take 17 g by mouth daily. (Patient not taking: Reported on 02/16/2024)     risperidone  (RISPERDAL ) 4 MG tablet Take 1 tablet (4 mg total) by mouth at bedtime. 90 tablet 1   No facility-administered medications prior to visit.    PAST MEDICAL HISTORY: Past Medical History:  Diagnosis Date   Anxiety    Bipolar 1 disorder (HCC)    Depression    High blood pressure    High cholesterol    Parkinson's disease (HCC)    Peripheral neuropathy    Thyroid  disease    Tremor     PAST SURGICAL HISTORY: Past Surgical History:  Procedure Laterality Date   HEMORRHOID SURGERY     1980's   IR ANGIOGRAM EXTREMITY LEFT  05/28/2020   IR RADIOLOGIST EVAL & MGMT  03/14/2020   IR US  GUIDE VASC ACCESS LEFT  05/28/2020   MULTIPLE TOOTH EXTRACTIONS     2016 Has top plate  FAMILY HISTORY: Family History  Problem Relation Age of Onset   Heart attack Mother    Liver disease Father    Parkinson's disease Neg Hx     SOCIAL HISTORY: Social History   Socioeconomic History   Marital status: Married    Spouse name: Harlene   Number of children: 1   Years of education: 12   Highest education level: Not on file  Occupational History    Comment: Not working  Tobacco Use   Smoking status:  Former    Current packs/day: 0.00    Types: Cigarettes    Quit date: 05/29/2019    Years since quitting: 5.3   Smokeless tobacco: Never   Tobacco comments:    only occasionally smokes  Vaping Use   Vaping status: Never Used  Substance and Sexual Activity   Alcohol use: No   Drug use: No    Comment: Quit 2009   Sexual activity: Not on file  Other Topics Concern   Not on file  Social History Narrative   Patient lives at home with with his wife Dorma). Patient is not working at this time. Patient has a 12th grade education.    Caffeine - four mountain dews daily.   Right handed.   Patient has one child.   Social Drivers of Corporate Investment Banker Strain: Not on file  Food Insecurity: Not on file  Transportation Needs: Not on file  Physical Activity: Not on file  Stress: Not on file  Social Connections: Not on file  Intimate Partner Violence: Not on file      PHYSICAL EXAMS, IMAGING, TESTING)  Generalized: Well developed, in no acute distress     Neurological examination  Mentation: Alert oriented to time, place, history taking. Follows all commands.  Masked facies.  Dysphonia. Cranial nerve II-XII: Pupils were equal round reactive to light extraocular movements were full.  Vertical gaze limited.  Facial sensation and strength were normal. hearing was intact to finger rubbing bilaterally. Head turning and shoulder shrug  were normal and symmetric. Motor: The motor testing reveals 5 over 5 strength of all 4 extremities. Good symmetric motor tone is noted throughout.  Contracture of left pinky Sensory: Sensory testing is soft touch  on all 4 extremities. No evidence of extinction is noted.  Coordination: Cerebellar testing reveals good finger-nose-finger and heel-to-shin bilaterally.  Gait and station: In a wheelchair    - I reviewed patient records, labs, notes, testing and imaging myself where available.  Lab Results  Component Value Date   WBC 11.0 (H)  05/28/2020   HGB 14.1 05/28/2020   HCT 43.9 05/28/2020   MCV 91.6 05/28/2020   PLT 229 05/28/2020      Component Value Date/Time   NA 139 05/28/2020 0735   NA 143 04/29/2017 0827   K 3.9 05/28/2020 0735   CL 105 05/28/2020 0735   CO2 23 05/28/2020 0735   GLUCOSE 104 (H) 05/28/2020 0735   BUN 12 05/28/2020 0735   BUN 10 04/29/2017 0827   CREATININE 0.99 05/28/2020 0735   CALCIUM 9.5 05/28/2020 0735   PROT 7.3 04/29/2017 0827   ALBUMIN 4.7 04/29/2017 0827   AST 12 04/29/2017 0827   ALT 14 04/29/2017 0827   ALKPHOS 116 04/29/2017 0827   BILITOT 0.5 04/29/2017 0827   GFRNONAA >60 05/28/2020 0735   GFRAA >60 05/28/2020 0735   Lab Results  Component Value Date   VITAMINB12 782 04/29/2017   Lab Results  Component Value Date  TSH 4.410 04/29/2017      ASSESSMENT AND PLAN 63 y.o. year old male  has a past medical history of Anxiety, Bipolar 1 disorder (HCC), Depression, High blood pressure, High cholesterol, Parkinson's disease (HCC), Peripheral neuropathy, Thyroid  disease, and Tremor. here with:  1.  Parkinson's disease vs. PSP/MSA  Continue Sinemet  25-100 mg 2 tablets in the a.m., 1 tablet at noon and at bedtime Continue to monitor symptoms Follow-up in 6 to 8 months or sooner if needed    Duwaine Russell, MSN, NP-C 09/19/2024, 1:09 PM [facility] [address], [address] [place], [place] [address] [phone]  The patient's condition requires frequent monitoring and adjustments in the treatment plan, reflecting the ongoing complexity of care.  This provider is the continuing focal point for all needed services for this condition.

## 2024-09-21 ENCOUNTER — Encounter: Payer: Self-pay | Admitting: Physician Assistant

## 2024-09-21 ENCOUNTER — Ambulatory Visit: Admitting: Physician Assistant

## 2024-09-21 DIAGNOSIS — E039 Hypothyroidism, unspecified: Secondary | ICD-10-CM | POA: Diagnosis not present

## 2024-09-21 DIAGNOSIS — F319 Bipolar disorder, unspecified: Secondary | ICD-10-CM | POA: Diagnosis not present

## 2024-09-21 MED ORDER — LITHIUM CARBONATE 300 MG PO CAPS: 600.0000 mg | ORAL_CAPSULE | Freq: Every evening | ORAL | 1 refills | Status: DC

## 2024-09-21 MED ORDER — RISPERIDONE 4 MG PO TABS: 4.0000 mg | ORAL_TABLET | Freq: Every day | ORAL | 1 refills | Status: AC

## 2024-09-21 NOTE — Progress Notes (Unsigned)
 [facility]  Patient ID: [name],  MRN: [id number]  PCP: Jimmy Hamilton, MD  Date of Evaluation: 09/21/2024 Time spent:25 minutes  Chief Complaint:  Chief Complaint   Anxiety; Depression; Follow-up    HISTORY/CURRENT STATUS: HPI For routine med check. Wife [name] is with him.  [name] is still unable to eat anything except mashed potatoes, and drinks Boost.  Saw Neuro a few days ago. Parkinson's is 'stable.' No new treatment.  As far as his mental health medications go he is stable.  He is doing fine off the Xanax  since last winter.  He sleeps well.  He is able to walk some but also spends time in the wheelchair.  ADLs are limited.  Hygiene is normal.  Denies extreme sadness.  He enjoys the times when he can be around his granddaughter and grandsons.  No manic symptoms.  No SI/HI.  Individual Medical History/ Review of Systems: Changes? :No    see HPI  Past medications for mental health diagnoses include: Lithium , Risperdal , Xanax , Synthroid , Latuda, trazodone, Depakote, Lamictal  Allergies: Codeine and Lamisil [terbinafine hcl]  Current Medications:  Current Outpatient Medications:    aspirin  81 MG EC tablet, Take 81 mg by mouth daily. , Disp: , Rfl:    atorvastatin (LIPITOR) 20 MG tablet, Take 20 mg by mouth at bedtime., Disp: , Rfl:    carbidopa -levodopa  (SINEMET  IR) 25-100 MG tablet, TAKE 2 tabs in the AM and 1 tab at noon and bedtime, Disp: 360 tablet, Rfl: 11   Cholecalciferol (VITAMIN D3) 1000 units CAPS, Take 1,000 Units by mouth daily., Disp: , Rfl:    gabapentin  (NEURONTIN ) 100 MG capsule, Take 100 mg by mouth at bedtime. , Disp: , Rfl:    levothyroxine  (SYNTHROID ) 75 MCG tablet, Take 1 tablet (75 mcg total) by mouth every morning., Disp: 90 tablet, Rfl: 1   levofloxacin (LEVAQUIN) 500 MG tablet, , Disp: , Rfl:    lithium  carbonate 300 MG capsule, Take 2 capsules (600 mg total) by mouth at bedtime. TAKE TWO CAPSULES BY MOUTH EVERY NIGHT AT BEDTIME, Disp:  180 capsule, Rfl: 1   risperidone  (RISPERDAL ) 4 MG tablet, Take 1 tablet (4 mg total) by mouth at bedtime., Disp: 90 tablet, Rfl: 1 Medication Side Effects: none  Family Medical/ Social History: Changes?  See HPI  MENTAL HEALTH EXAM:  There were no vitals taken for this visit.There is no height or weight on file to calculate BMI.  General Appearance: Casual and Well Groomed  Eye Contact:  Good  Speech:  Clear and Coherent and Slow due to PD  Volume:  Normal  Mood:  Euthymic  Affect:  Flat   Thought Process:  Goal Directed and Descriptions of Associations: Intact  Orientation:  Full (Time, Place, and Person)  Thought Content: Logical   Suicidal Thoughts:  No  Homicidal Thoughts:  No  Memory:  difficult to assess  Judgement:  Good  Insight:  Good  Psychomotor Activity:  In wheelchair  Concentration:  Concentration: Good  Recall:  Good  Fund of Knowledge: Good  Language: Good  Assets:  Desire for Improvement Financial Resources/Insurance Housing Transportation  ADL's:  Impaired  Cognition: WNL  Prognosis:  Fair   PCP follows labs.  DIAGNOSES:    ICD-10-CM   1. Bipolar I disorder (HCC)  F31.9     2. Hypothyroidism, unspecified type  E03.9       Receiving Psychotherapy: No   RECOMMENDATIONS: PDMP was reviewed.  Last Xanax  filled 12/22/2023.  Gabapentin  filled  06/24/2024. I provided approximately  25  minutes of face to face time during this encounter, including time spent before and after the visit in records review, medical decision making, counseling pertinent to today's visit, and charting.   As far as his mental health medications go he is stable.  No changes are needed.  Continue gabapentin  100 mg 1 nightly. Continue Synthroid  75 mcg q am. Continue lithium  300 mg, 2 p.o. nightly. Continue Risperdal  4 mg nightly. Return in 6 months.  Verneita Cooks, PA-C

## 2024-09-27 ENCOUNTER — Ambulatory Visit: Admitting: Adult Health

## 2024-09-28 ENCOUNTER — Other Ambulatory Visit: Payer: Self-pay | Admitting: Neurology

## 2024-11-04 ENCOUNTER — Other Ambulatory Visit: Payer: Self-pay | Admitting: Physician Assistant

## 2024-11-27 ENCOUNTER — Encounter: Payer: Self-pay | Admitting: Physician Assistant

## 2024-11-27 ENCOUNTER — Ambulatory Visit: Payer: Self-pay | Admitting: Physician Assistant

## 2024-11-27 NOTE — Progress Notes (Signed)
 Please call his wife [name] and let her know I got all the labs from Dr. Larnell.  From my perspective, things look good.  There wasn't a Lithium  level, but that's ok b/c it was done in July.  Will check his level this summer. Thanks

## 2024-11-28 NOTE — Progress Notes (Signed)
 Spoke with [name] regarding her husbands lab results. Notified her his labs were good and the lithium  level will be checked in July.

## 2025-03-21 ENCOUNTER — Ambulatory Visit: Admitting: Physician Assistant

## 2025-05-01 ENCOUNTER — Ambulatory Visit: Admitting: Neurology
